# Patient Record
Sex: Female | Born: 1955 | Race: White | Hispanic: No | Marital: Married | Smoking: Current every day smoker
Health system: Southern US, Community
[De-identification: ages and names within clinical notes are randomized; demographics above are authoritative.]

## PROBLEM LIST (undated history)

## (undated) DIAGNOSIS — D696 Thrombocytopenia, unspecified: Secondary | ICD-10-CM

## (undated) DIAGNOSIS — K7290 Hepatic failure, unspecified without coma: Secondary | ICD-10-CM

## (undated) DIAGNOSIS — G8929 Other chronic pain: Secondary | ICD-10-CM

## (undated) DIAGNOSIS — M549 Dorsalgia, unspecified: Secondary | ICD-10-CM

## (undated) DIAGNOSIS — I10 Essential (primary) hypertension: Secondary | ICD-10-CM

## (undated) DIAGNOSIS — M797 Fibromyalgia: Secondary | ICD-10-CM

## (undated) DIAGNOSIS — M542 Cervicalgia: Secondary | ICD-10-CM

## (undated) DIAGNOSIS — I8290 Acute embolism and thrombosis of unspecified vein: Secondary | ICD-10-CM

## (undated) DIAGNOSIS — K7682 Hepatic encephalopathy: Secondary | ICD-10-CM

## (undated) DIAGNOSIS — F419 Anxiety disorder, unspecified: Secondary | ICD-10-CM

## (undated) DIAGNOSIS — K7010 Alcoholic hepatitis without ascites: Secondary | ICD-10-CM

## (undated) HISTORY — PX: ROTATOR CUFF REPAIR: SHX139

---

## 1997-08-30 ENCOUNTER — Emergency Department (HOSPITAL_COMMUNITY): Admission: EM | Admit: 1997-08-30 | Discharge: 1997-08-30 | Payer: Self-pay | Admitting: Emergency Medicine

## 1998-10-27 ENCOUNTER — Other Ambulatory Visit: Admission: RE | Admit: 1998-10-27 | Discharge: 1998-10-27 | Payer: Self-pay | Admitting: Obstetrics and Gynecology

## 1998-11-10 ENCOUNTER — Encounter: Admission: RE | Admit: 1998-11-10 | Discharge: 1998-12-03 | Payer: Self-pay | Admitting: Orthopedic Surgery

## 1999-01-29 ENCOUNTER — Emergency Department (HOSPITAL_COMMUNITY): Admission: EM | Admit: 1999-01-29 | Discharge: 1999-01-29 | Payer: Self-pay | Admitting: Emergency Medicine

## 1999-12-13 ENCOUNTER — Encounter: Admission: RE | Admit: 1999-12-13 | Discharge: 1999-12-13 | Payer: Self-pay

## 2000-07-05 ENCOUNTER — Inpatient Hospital Stay (HOSPITAL_COMMUNITY): Admission: EM | Admit: 2000-07-05 | Discharge: 2000-07-06 | Payer: Self-pay | Admitting: *Deleted

## 2000-12-02 ENCOUNTER — Emergency Department (HOSPITAL_COMMUNITY): Admission: EM | Admit: 2000-12-02 | Discharge: 2000-12-02 | Payer: Self-pay

## 2000-12-28 ENCOUNTER — Encounter: Admission: RE | Admit: 2000-12-28 | Discharge: 2000-12-28 | Payer: Self-pay

## 2005-01-07 ENCOUNTER — Encounter: Admission: RE | Admit: 2005-01-07 | Discharge: 2005-01-07 | Payer: Self-pay | Admitting: Family Medicine

## 2006-09-29 ENCOUNTER — Ambulatory Visit (HOSPITAL_COMMUNITY): Admission: RE | Admit: 2006-09-29 | Discharge: 2006-09-29 | Payer: Self-pay | Admitting: Gastroenterology

## 2006-09-29 ENCOUNTER — Encounter (INDEPENDENT_AMBULATORY_CARE_PROVIDER_SITE_OTHER): Payer: Self-pay | Admitting: Specialist

## 2007-02-16 ENCOUNTER — Encounter: Admission: RE | Admit: 2007-02-16 | Discharge: 2007-02-16 | Payer: Self-pay | Admitting: Family Medicine

## 2008-04-02 ENCOUNTER — Encounter (HOSPITAL_COMMUNITY): Admission: RE | Admit: 2008-04-02 | Discharge: 2008-05-22 | Payer: Self-pay | Admitting: Family Medicine

## 2008-06-11 ENCOUNTER — Encounter (HOSPITAL_COMMUNITY): Admission: RE | Admit: 2008-06-11 | Discharge: 2008-09-09 | Payer: Self-pay | Admitting: Family Medicine

## 2009-09-07 ENCOUNTER — Inpatient Hospital Stay (HOSPITAL_COMMUNITY)
Admission: EM | Admit: 2009-09-07 | Discharge: 2009-09-21 | Payer: Self-pay | Source: Home / Self Care | Admitting: Emergency Medicine

## 2009-09-08 ENCOUNTER — Encounter (INDEPENDENT_AMBULATORY_CARE_PROVIDER_SITE_OTHER): Payer: Self-pay | Admitting: Internal Medicine

## 2009-09-13 ENCOUNTER — Ambulatory Visit: Payer: Self-pay | Admitting: Internal Medicine

## 2009-10-02 ENCOUNTER — Inpatient Hospital Stay (HOSPITAL_COMMUNITY): Admission: EM | Admit: 2009-10-02 | Discharge: 2009-10-05 | Payer: Self-pay | Admitting: Emergency Medicine

## 2009-10-24 ENCOUNTER — Observation Stay (HOSPITAL_COMMUNITY): Admission: EM | Admit: 2009-10-24 | Discharge: 2009-10-25 | Payer: Self-pay | Admitting: Emergency Medicine

## 2009-11-09 ENCOUNTER — Encounter: Admission: RE | Admit: 2009-11-09 | Discharge: 2009-11-09 | Payer: Self-pay | Admitting: Internal Medicine

## 2010-08-09 LAB — GLUCOSE, CAPILLARY
Glucose-Capillary: 187 mg/dL — ABNORMAL HIGH (ref 70–99)
Glucose-Capillary: 373 mg/dL — ABNORMAL HIGH (ref 70–99)
Glucose-Capillary: 394 mg/dL — ABNORMAL HIGH (ref 70–99)
Glucose-Capillary: 420 mg/dL — ABNORMAL HIGH (ref 70–99)
Glucose-Capillary: 442 mg/dL — ABNORMAL HIGH (ref 70–99)
Glucose-Capillary: 475 mg/dL — ABNORMAL HIGH (ref 70–99)
Glucose-Capillary: 509 mg/dL — ABNORMAL HIGH (ref 70–99)
Glucose-Capillary: 187 mg/dL — ABNORMAL HIGH (ref 70–99)
Glucose-Capillary: 218 mg/dL — ABNORMAL HIGH (ref 70–99)
Glucose-Capillary: 265 mg/dL — ABNORMAL HIGH (ref 70–99)
Glucose-Capillary: 312 mg/dL — ABNORMAL HIGH (ref 70–99)
Glucose-Capillary: 317 mg/dL — ABNORMAL HIGH (ref 70–99)
Glucose-Capillary: 321 mg/dL — ABNORMAL HIGH (ref 70–99)

## 2010-08-09 LAB — HEMOGLOBIN A1C
Hgb A1c MFr Bld: 6.9 % — ABNORMAL HIGH (ref <5.7)
Mean Plasma Glucose: 151 mg/dL — ABNORMAL HIGH (ref <117)

## 2010-08-09 LAB — URINALYSIS, ROUTINE W REFLEX MICROSCOPIC
Bilirubin Urine: NEGATIVE
Glucose, UA: >1000 mg/dL — AB
Hgb urine dipstick: NEGATIVE
Ketones, ur: NEGATIVE mg/dL
Leukocytes, UA: NEGATIVE
Nitrite: NEGATIVE
Protein, ur: NEGATIVE mg/dL
Specific Gravity, Urine: 1.031 — ABNORMAL HIGH (ref 1.005–1.030)
Urobilinogen, UA: 0.2 mg/dL (ref 0.0–1.0)
pH: 5.5 (ref 5.0–8.0)

## 2010-08-09 LAB — COMPREHENSIVE METABOLIC PANEL
ALT: 37 U/L — ABNORMAL HIGH (ref 0–35)
AST: 52 U/L — ABNORMAL HIGH (ref 0–37)
Albumin: 3.3 g/dL — ABNORMAL LOW (ref 3.5–5.2)
Alkaline Phosphatase: 134 U/L — ABNORMAL HIGH (ref 39–117)
BUN: 6 mg/dL (ref 6–23)
CO2: 24 mEq/L (ref 19–32)
Calcium: 9.5 mg/dL (ref 8.4–10.5)
Chloride: 91 mEq/L — ABNORMAL LOW (ref 96–112)
Creatinine, Ser: 0.8 mg/dL (ref 0.4–1.2)
GFR calc Af Amer: >60 mL/min (ref >60)
GFR calc non Af Amer: >60 mL/min (ref >60)
Glucose, Bld: 483 mg/dL — ABNORMAL HIGH (ref 70–99)
Potassium: 3.5 mEq/L (ref 3.5–5.1)
Sodium: 126 mEq/L — ABNORMAL LOW (ref 135–145)
Total Bilirubin: 3.2 mg/dL — ABNORMAL HIGH (ref 0.3–1.2)
Total Protein: 7.5 g/dL (ref 6.0–8.3)

## 2010-08-09 LAB — BASIC METABOLIC PANEL
BUN: 3 mg/dL — ABNORMAL LOW (ref 6–23)
BUN: 6 mg/dL (ref 6–23)
CO2: 24 mEq/L (ref 19–32)
CO2: 26 mEq/L (ref 19–32)
Calcium: 8.3 mg/dL — ABNORMAL LOW (ref 8.4–10.5)
Calcium: 8.5 mg/dL (ref 8.4–10.5)
Chloride: 104 mEq/L (ref 96–112)
Chloride: 105 mEq/L (ref 96–112)
Creatinine, Ser: 0.49 mg/dL (ref 0.4–1.2)
Creatinine, Ser: 0.51 mg/dL (ref 0.4–1.2)
GFR calc Af Amer: >60 mL/min (ref >60)
GFR calc Af Amer: >60 mL/min (ref >60)
GFR calc non Af Amer: >60 mL/min (ref >60)
GFR calc non Af Amer: >60 mL/min (ref >60)
Glucose, Bld: 193 mg/dL — ABNORMAL HIGH (ref 70–99)
Glucose, Bld: 220 mg/dL — ABNORMAL HIGH (ref 70–99)
Potassium: 3.4 mEq/L — ABNORMAL LOW (ref 3.5–5.1)
Potassium: 3.6 mEq/L (ref 3.5–5.1)
Sodium: 136 mEq/L (ref 135–145)
Sodium: 137 mEq/L (ref 135–145)

## 2010-08-09 LAB — CBC
HCT: 33.7 % — ABNORMAL LOW (ref 36.0–46.0)
HCT: 26.4 % — ABNORMAL LOW (ref 36.0–46.0)
Hemoglobin: 11.5 g/dL — ABNORMAL LOW (ref 12.0–15.0)
Hemoglobin: 9 g/dL — ABNORMAL LOW (ref 12.0–15.0)
MCHC: 34.2 g/dL (ref 30.0–36.0)
MCHC: 34.2 g/dL (ref 30.0–36.0)
MCV: 97.9 fL (ref 78.0–100.0)
MCV: 111.6 fL — ABNORMAL HIGH (ref 78.0–100.0)
Platelets: 151 10*3/uL (ref 150–400)
Platelets: 108 10*3/uL — ABNORMAL LOW (ref 150–400)
RBC: 3.44 MIL/uL — ABNORMAL LOW (ref 3.87–5.11)
RBC: 2.36 MIL/uL — ABNORMAL LOW (ref 3.87–5.11)
RDW: 19.8 % — ABNORMAL HIGH (ref 11.5–15.5)
RDW: 17.2 % — ABNORMAL HIGH (ref 11.5–15.5)
WBC: 6.2 10*3/uL (ref 4.0–10.5)
WBC: 6.7 10*3/uL (ref 4.0–10.5)

## 2010-08-09 LAB — HEPATIC FUNCTION PANEL
ALT: 55 U/L — ABNORMAL HIGH (ref 0–35)
AST: 64 U/L — ABNORMAL HIGH (ref 0–37)
Albumin: 2.3 g/dL — ABNORMAL LOW (ref 3.5–5.2)
Alkaline Phosphatase: 151 U/L — ABNORMAL HIGH (ref 39–117)
Bilirubin, Direct: 2.5 mg/dL — ABNORMAL HIGH (ref 0.0–0.3)
Indirect Bilirubin: 2.9 mg/dL — ABNORMAL HIGH (ref 0.3–0.9)
Total Bilirubin: 5.4 mg/dL — ABNORMAL HIGH (ref 0.3–1.2)
Total Protein: 5.9 g/dL — ABNORMAL LOW (ref 6.0–8.3)

## 2010-08-09 LAB — POCT CARDIAC MARKERS
CKMB, poc: <1 ng/mL — ABNORMAL LOW (ref 1.0–8.0)
Myoglobin, poc: 75.7 ng/mL (ref 12–200)
Troponin i, poc: <0.05 ng/mL (ref 0.00–0.09)

## 2010-08-09 LAB — URINE MICROSCOPIC-ADD ON

## 2010-08-09 LAB — DIFFERENTIAL
Basophils Absolute: 0 10*3/uL (ref 0.0–0.1)
Basophils Relative: 1 % (ref 0–1)
Eosinophils Absolute: 0.1 10*3/uL (ref 0.0–0.7)
Eosinophils Relative: 2 % (ref 0–5)
Lymphocytes Relative: 26 % (ref 12–46)
Lymphs Abs: 1.6 10*3/uL (ref 0.7–4.0)
Monocytes Absolute: 0.8 10*3/uL (ref 0.1–1.0)
Monocytes Relative: 12 % (ref 3–12)
Neutro Abs: 3.6 10*3/uL (ref 1.7–7.7)
Neutrophils Relative %: 59 % (ref 43–77)

## 2010-08-10 LAB — COMPREHENSIVE METABOLIC PANEL
ALT: 36 U/L — ABNORMAL HIGH (ref 0–35)
ALT: 39 U/L — ABNORMAL HIGH (ref 0–35)
ALT: 54 U/L — ABNORMAL HIGH (ref 0–35)
ALT: 30 U/L (ref 0–35)
ALT: 31 U/L (ref 0–35)
ALT: 32 U/L (ref 0–35)
ALT: 32 U/L (ref 0–35)
ALT: 33 U/L (ref 0–35)
ALT: 33 U/L (ref 0–35)
ALT: 33 U/L (ref 0–35)
ALT: 36 U/L — ABNORMAL HIGH (ref 0–35)
ALT: 36 U/L — ABNORMAL HIGH (ref 0–35)
ALT: 37 U/L — ABNORMAL HIGH (ref 0–35)
ALT: 60 U/L — ABNORMAL HIGH (ref 0–35)
AST: 58 U/L — ABNORMAL HIGH (ref 0–37)
AST: 85 U/L — ABNORMAL HIGH (ref 0–37)
AST: 95 U/L — ABNORMAL HIGH (ref 0–37)
AST: 114 U/L — ABNORMAL HIGH (ref 0–37)
AST: 115 U/L — ABNORMAL HIGH (ref 0–37)
AST: 119 U/L — ABNORMAL HIGH (ref 0–37)
AST: 198 U/L — ABNORMAL HIGH (ref 0–37)
AST: 82 U/L — ABNORMAL HIGH (ref 0–37)
AST: 86 U/L — ABNORMAL HIGH (ref 0–37)
AST: 88 U/L — ABNORMAL HIGH (ref 0–37)
AST: 89 U/L — ABNORMAL HIGH (ref 0–37)
AST: 91 U/L — ABNORMAL HIGH (ref 0–37)
AST: 93 U/L — ABNORMAL HIGH (ref 0–37)
AST: 94 U/L — ABNORMAL HIGH (ref 0–37)
Albumin: 1.9 g/dL — ABNORMAL LOW (ref 3.5–5.2)
Albumin: 2.2 g/dL — ABNORMAL LOW (ref 3.5–5.2)
Albumin: 2.2 g/dL — ABNORMAL LOW (ref 3.5–5.2)
Albumin: 1.4 g/dL — ABNORMAL LOW (ref 3.5–5.2)
Albumin: 1.5 g/dL — ABNORMAL LOW (ref 3.5–5.2)
Albumin: 1.7 g/dL — ABNORMAL LOW (ref 3.5–5.2)
Albumin: 1.8 g/dL — ABNORMAL LOW (ref 3.5–5.2)
Albumin: 1.8 g/dL — ABNORMAL LOW (ref 3.5–5.2)
Albumin: 1.8 g/dL — ABNORMAL LOW (ref 3.5–5.2)
Albumin: 1.9 g/dL — ABNORMAL LOW (ref 3.5–5.2)
Albumin: 1.9 g/dL — ABNORMAL LOW (ref 3.5–5.2)
Albumin: 2.2 g/dL — ABNORMAL LOW (ref 3.5–5.2)
Albumin: 2.2 g/dL — ABNORMAL LOW (ref 3.5–5.2)
Albumin: 2.3 g/dL — ABNORMAL LOW (ref 3.5–5.2)
Alkaline Phosphatase: 131 U/L — ABNORMAL HIGH (ref 39–117)
Alkaline Phosphatase: 149 U/L — ABNORMAL HIGH (ref 39–117)
Alkaline Phosphatase: 154 U/L — ABNORMAL HIGH (ref 39–117)
Alkaline Phosphatase: 130 U/L — ABNORMAL HIGH (ref 39–117)
Alkaline Phosphatase: 130 U/L — ABNORMAL HIGH (ref 39–117)
Alkaline Phosphatase: 133 U/L — ABNORMAL HIGH (ref 39–117)
Alkaline Phosphatase: 137 U/L — ABNORMAL HIGH (ref 39–117)
Alkaline Phosphatase: 144 U/L — ABNORMAL HIGH (ref 39–117)
Alkaline Phosphatase: 152 U/L — ABNORMAL HIGH (ref 39–117)
Alkaline Phosphatase: 159 U/L — ABNORMAL HIGH (ref 39–117)
Alkaline Phosphatase: 181 U/L — ABNORMAL HIGH (ref 39–117)
Alkaline Phosphatase: 195 U/L — ABNORMAL HIGH (ref 39–117)
Alkaline Phosphatase: 211 U/L — ABNORMAL HIGH (ref 39–117)
Alkaline Phosphatase: 349 U/L — ABNORMAL HIGH (ref 39–117)
BUN: 5 mg/dL — ABNORMAL LOW (ref 6–23)
BUN: 7 mg/dL (ref 6–23)
BUN: 9 mg/dL (ref 6–23)
BUN: 2 mg/dL — ABNORMAL LOW (ref 6–23)
BUN: 3 mg/dL — ABNORMAL LOW (ref 6–23)
BUN: 3 mg/dL — ABNORMAL LOW (ref 6–23)
BUN: 3 mg/dL — ABNORMAL LOW (ref 6–23)
BUN: 3 mg/dL — ABNORMAL LOW (ref 6–23)
BUN: 4 mg/dL — ABNORMAL LOW (ref 6–23)
BUN: 5 mg/dL — ABNORMAL LOW (ref 6–23)
BUN: 6 mg/dL (ref 6–23)
BUN: <1 mg/dL — ABNORMAL LOW (ref 6–23)
BUN: <1 mg/dL — ABNORMAL LOW (ref 6–23)
BUN: <1 mg/dL — ABNORMAL LOW (ref 6–23)
CO2: 21 mEq/L (ref 19–32)
CO2: 21 mEq/L (ref 19–32)
CO2: 29 mEq/L (ref 19–32)
CO2: 21 mEq/L (ref 19–32)
CO2: 21 mEq/L (ref 19–32)
CO2: 21 mEq/L (ref 19–32)
CO2: 22 mEq/L (ref 19–32)
CO2: 22 mEq/L (ref 19–32)
CO2: 22 mEq/L (ref 19–32)
CO2: 22 mEq/L (ref 19–32)
CO2: 23 mEq/L (ref 19–32)
CO2: 23 mEq/L (ref 19–32)
CO2: 24 mEq/L (ref 19–32)
CO2: 25 mEq/L (ref 19–32)
Calcium: 7.9 mg/dL — ABNORMAL LOW (ref 8.4–10.5)
Calcium: 8 mg/dL — ABNORMAL LOW (ref 8.4–10.5)
Calcium: 8.1 mg/dL — ABNORMAL LOW (ref 8.4–10.5)
Calcium: 7.4 mg/dL — ABNORMAL LOW (ref 8.4–10.5)
Calcium: 7.5 mg/dL — ABNORMAL LOW (ref 8.4–10.5)
Calcium: 7.7 mg/dL — ABNORMAL LOW (ref 8.4–10.5)
Calcium: 7.7 mg/dL — ABNORMAL LOW (ref 8.4–10.5)
Calcium: 7.8 mg/dL — ABNORMAL LOW (ref 8.4–10.5)
Calcium: 7.8 mg/dL — ABNORMAL LOW (ref 8.4–10.5)
Calcium: 7.9 mg/dL — ABNORMAL LOW (ref 8.4–10.5)
Calcium: 7.9 mg/dL — ABNORMAL LOW (ref 8.4–10.5)
Calcium: 7.9 mg/dL — ABNORMAL LOW (ref 8.4–10.5)
Calcium: 8 mg/dL — ABNORMAL LOW (ref 8.4–10.5)
Calcium: 8.1 mg/dL — ABNORMAL LOW (ref 8.4–10.5)
Chloride: 100 mEq/L (ref 96–112)
Chloride: 108 mEq/L (ref 96–112)
Chloride: 111 mEq/L (ref 96–112)
Chloride: 100 mEq/L (ref 96–112)
Chloride: 104 mEq/L (ref 96–112)
Chloride: 104 mEq/L (ref 96–112)
Chloride: 104 mEq/L (ref 96–112)
Chloride: 107 mEq/L (ref 96–112)
Chloride: 107 mEq/L (ref 96–112)
Chloride: 108 mEq/L (ref 96–112)
Chloride: 109 mEq/L (ref 96–112)
Chloride: 84 mEq/L — ABNORMAL LOW (ref 96–112)
Chloride: 98 mEq/L (ref 96–112)
Chloride: 99 mEq/L (ref 96–112)
Creatinine, Ser: 0.54 mg/dL (ref 0.4–1.2)
Creatinine, Ser: 0.55 mg/dL (ref 0.4–1.2)
Creatinine, Ser: 0.56 mg/dL (ref 0.4–1.2)
Creatinine, Ser: 0.45 mg/dL (ref 0.4–1.2)
Creatinine, Ser: 0.45 mg/dL (ref 0.4–1.2)
Creatinine, Ser: 0.46 mg/dL (ref 0.4–1.2)
Creatinine, Ser: 0.48 mg/dL (ref 0.4–1.2)
Creatinine, Ser: 0.52 mg/dL (ref 0.4–1.2)
Creatinine, Ser: 0.53 mg/dL (ref 0.4–1.2)
Creatinine, Ser: 0.54 mg/dL (ref 0.4–1.2)
Creatinine, Ser: 0.54 mg/dL (ref 0.4–1.2)
Creatinine, Ser: 0.54 mg/dL (ref 0.4–1.2)
Creatinine, Ser: 0.55 mg/dL (ref 0.4–1.2)
Creatinine, Ser: 0.57 mg/dL (ref 0.4–1.2)
GFR calc Af Amer: >60 mL/min (ref >60)
GFR calc Af Amer: >60 mL/min (ref >60)
GFR calc Af Amer: >60 mL/min (ref >60)
GFR calc Af Amer: >60 mL/min (ref >60)
GFR calc Af Amer: >60 mL/min (ref >60)
GFR calc Af Amer: >60 mL/min (ref >60)
GFR calc Af Amer: >60 mL/min (ref >60)
GFR calc Af Amer: >60 mL/min (ref >60)
GFR calc Af Amer: >60 mL/min (ref >60)
GFR calc Af Amer: >60 mL/min (ref >60)
GFR calc Af Amer: >60 mL/min (ref >60)
GFR calc Af Amer: >60 mL/min (ref >60)
GFR calc Af Amer: >60 mL/min (ref >60)
GFR calc Af Amer: >60 mL/min (ref >60)
GFR calc non Af Amer: >60 mL/min (ref >60)
GFR calc non Af Amer: >60 mL/min (ref >60)
GFR calc non Af Amer: >60 mL/min (ref >60)
GFR calc non Af Amer: >60 mL/min (ref >60)
GFR calc non Af Amer: >60 mL/min (ref >60)
GFR calc non Af Amer: >60 mL/min (ref >60)
GFR calc non Af Amer: >60 mL/min (ref >60)
GFR calc non Af Amer: >60 mL/min (ref >60)
GFR calc non Af Amer: >60 mL/min (ref >60)
GFR calc non Af Amer: >60 mL/min (ref >60)
GFR calc non Af Amer: >60 mL/min (ref >60)
GFR calc non Af Amer: >60 mL/min (ref >60)
GFR calc non Af Amer: >60 mL/min (ref >60)
GFR calc non Af Amer: >60 mL/min (ref >60)
Glucose, Bld: 139 mg/dL — ABNORMAL HIGH (ref 70–99)
Glucose, Bld: 207 mg/dL — ABNORMAL HIGH (ref 70–99)
Glucose, Bld: 214 mg/dL — ABNORMAL HIGH (ref 70–99)
Glucose, Bld: 104 mg/dL — ABNORMAL HIGH (ref 70–99)
Glucose, Bld: 106 mg/dL — ABNORMAL HIGH (ref 70–99)
Glucose, Bld: 107 mg/dL — ABNORMAL HIGH (ref 70–99)
Glucose, Bld: 108 mg/dL — ABNORMAL HIGH (ref 70–99)
Glucose, Bld: 113 mg/dL — ABNORMAL HIGH (ref 70–99)
Glucose, Bld: 113 mg/dL — ABNORMAL HIGH (ref 70–99)
Glucose, Bld: 139 mg/dL — ABNORMAL HIGH (ref 70–99)
Glucose, Bld: 141 mg/dL — ABNORMAL HIGH (ref 70–99)
Glucose, Bld: 142 mg/dL — ABNORMAL HIGH (ref 70–99)
Glucose, Bld: 143 mg/dL — ABNORMAL HIGH (ref 70–99)
Glucose, Bld: 96 mg/dL (ref 70–99)
Potassium: 2.9 mEq/L — ABNORMAL LOW (ref 3.5–5.1)
Potassium: 3.6 mEq/L (ref 3.5–5.1)
Potassium: 4.2 mEq/L (ref 3.5–5.1)
Potassium: 3.3 mEq/L — ABNORMAL LOW (ref 3.5–5.1)
Potassium: 3.4 mEq/L — ABNORMAL LOW (ref 3.5–5.1)
Potassium: 3.4 mEq/L — ABNORMAL LOW (ref 3.5–5.1)
Potassium: 3.6 mEq/L (ref 3.5–5.1)
Potassium: 3.6 mEq/L (ref 3.5–5.1)
Potassium: 3.6 mEq/L (ref 3.5–5.1)
Potassium: 3.6 mEq/L (ref 3.5–5.1)
Potassium: 3.6 mEq/L (ref 3.5–5.1)
Potassium: 3.7 mEq/L (ref 3.5–5.1)
Potassium: 3.7 mEq/L (ref 3.5–5.1)
Potassium: 3.8 mEq/L (ref 3.5–5.1)
Sodium: 132 mEq/L — ABNORMAL LOW (ref 135–145)
Sodium: 132 mEq/L — ABNORMAL LOW (ref 135–145)
Sodium: 135 mEq/L (ref 135–145)
Sodium: 115 mEq/L — CL (ref 135–145)
Sodium: 125 mEq/L — ABNORMAL LOW (ref 135–145)
Sodium: 128 mEq/L — ABNORMAL LOW (ref 135–145)
Sodium: 129 mEq/L — ABNORMAL LOW (ref 135–145)
Sodium: 132 mEq/L — ABNORMAL LOW (ref 135–145)
Sodium: 132 mEq/L — ABNORMAL LOW (ref 135–145)
Sodium: 133 mEq/L — ABNORMAL LOW (ref 135–145)
Sodium: 133 mEq/L — ABNORMAL LOW (ref 135–145)
Sodium: 133 mEq/L — ABNORMAL LOW (ref 135–145)
Sodium: 134 mEq/L — ABNORMAL LOW (ref 135–145)
Sodium: 136 mEq/L (ref 135–145)
Total Bilirubin: 12.4 mg/dL — ABNORMAL HIGH (ref 0.3–1.2)
Total Bilirubin: 13.6 mg/dL — ABNORMAL HIGH (ref 0.3–1.2)
Total Bilirubin: 5.2 mg/dL — ABNORMAL HIGH (ref 0.3–1.2)
Total Bilirubin: 14.5 mg/dL — ABNORMAL HIGH (ref 0.3–1.2)
Total Bilirubin: 15.1 mg/dL — ABNORMAL HIGH (ref 0.3–1.2)
Total Bilirubin: 15.3 mg/dL — ABNORMAL HIGH (ref 0.3–1.2)
Total Bilirubin: 16.4 mg/dL — ABNORMAL HIGH (ref 0.3–1.2)
Total Bilirubin: 16.5 mg/dL — ABNORMAL HIGH (ref 0.3–1.2)
Total Bilirubin: 16.8 mg/dL — ABNORMAL HIGH (ref 0.3–1.2)
Total Bilirubin: 16.8 mg/dL — ABNORMAL HIGH (ref 0.3–1.2)
Total Bilirubin: 17 mg/dL — ABNORMAL HIGH (ref 0.3–1.2)
Total Bilirubin: 17.3 mg/dL — ABNORMAL HIGH (ref 0.3–1.2)
Total Bilirubin: 18.4 mg/dL (ref 0.3–1.2)
Total Bilirubin: 21.3 mg/dL (ref 0.3–1.2)
Total Protein: 5.7 g/dL — ABNORMAL LOW (ref 6.0–8.3)
Total Protein: 5.7 g/dL — ABNORMAL LOW (ref 6.0–8.3)
Total Protein: 6.1 g/dL (ref 6.0–8.3)
Total Protein: 5.4 g/dL — ABNORMAL LOW (ref 6.0–8.3)
Total Protein: 5.5 g/dL — ABNORMAL LOW (ref 6.0–8.3)
Total Protein: 5.5 g/dL — ABNORMAL LOW (ref 6.0–8.3)
Total Protein: 5.5 g/dL — ABNORMAL LOW (ref 6.0–8.3)
Total Protein: 5.5 g/dL — ABNORMAL LOW (ref 6.0–8.3)
Total Protein: 5.6 g/dL — ABNORMAL LOW (ref 6.0–8.3)
Total Protein: 5.6 g/dL — ABNORMAL LOW (ref 6.0–8.3)
Total Protein: 5.6 g/dL — ABNORMAL LOW (ref 6.0–8.3)
Total Protein: 5.8 g/dL — ABNORMAL LOW (ref 6.0–8.3)
Total Protein: 6.3 g/dL (ref 6.0–8.3)
Total Protein: 7.6 g/dL (ref 6.0–8.3)

## 2010-08-10 LAB — PROTIME-INR
INR: 1.5 — ABNORMAL HIGH (ref 0.00–1.49)
INR: 2.17 — ABNORMAL HIGH (ref 0.00–1.49)
INR: 1.7 — ABNORMAL HIGH (ref 0.00–1.49)
INR: 1.77 — ABNORMAL HIGH (ref 0.00–1.49)
INR: 1.79 — ABNORMAL HIGH (ref 0.00–1.49)
INR: 1.83 — ABNORMAL HIGH (ref 0.00–1.49)
INR: 1.83 — ABNORMAL HIGH (ref 0.00–1.49)
INR: 1.92 — ABNORMAL HIGH (ref 0.00–1.49)
INR: 1.98 — ABNORMAL HIGH (ref 0.00–1.49)
INR: 2.3 — ABNORMAL HIGH (ref 0.00–1.49)
Prothrombin Time: 18 seconds — ABNORMAL HIGH (ref 11.6–15.2)
Prothrombin Time: 24 seconds — ABNORMAL HIGH (ref 11.6–15.2)
Prothrombin Time: 19.8 seconds — ABNORMAL HIGH (ref 11.6–15.2)
Prothrombin Time: 20.5 seconds — ABNORMAL HIGH (ref 11.6–15.2)
Prothrombin Time: 20.6 seconds — ABNORMAL HIGH (ref 11.6–15.2)
Prothrombin Time: 21 seconds — ABNORMAL HIGH (ref 11.6–15.2)
Prothrombin Time: 21 seconds — ABNORMAL HIGH (ref 11.6–15.2)
Prothrombin Time: 21.8 seconds — ABNORMAL HIGH (ref 11.6–15.2)
Prothrombin Time: 22.3 seconds — ABNORMAL HIGH (ref 11.6–15.2)
Prothrombin Time: 25.1 seconds — ABNORMAL HIGH (ref 11.6–15.2)

## 2010-08-10 LAB — BASIC METABOLIC PANEL
BUN: 9 mg/dL (ref 6–23)
BUN: 2 mg/dL — ABNORMAL LOW (ref 6–23)
BUN: 2 mg/dL — ABNORMAL LOW (ref 6–23)
BUN: 2 mg/dL — ABNORMAL LOW (ref 6–23)
BUN: 2 mg/dL — ABNORMAL LOW (ref 6–23)
BUN: 2 mg/dL — ABNORMAL LOW (ref 6–23)
BUN: 2 mg/dL — ABNORMAL LOW (ref 6–23)
BUN: 2 mg/dL — ABNORMAL LOW (ref 6–23)
BUN: 2 mg/dL — ABNORMAL LOW (ref 6–23)
BUN: 3 mg/dL — ABNORMAL LOW (ref 6–23)
BUN: 3 mg/dL — ABNORMAL LOW (ref 6–23)
CO2: 25 mEq/L (ref 19–32)
CO2: 22 mEq/L (ref 19–32)
CO2: 22 mEq/L (ref 19–32)
CO2: 23 mEq/L (ref 19–32)
CO2: 23 mEq/L (ref 19–32)
CO2: 23 mEq/L (ref 19–32)
CO2: 23 mEq/L (ref 19–32)
CO2: 23 mEq/L (ref 19–32)
CO2: 23 mEq/L (ref 19–32)
CO2: 25 mEq/L (ref 19–32)
CO2: 25 mEq/L (ref 19–32)
Calcium: 9 mg/dL (ref 8.4–10.5)
Calcium: 7.6 mg/dL — ABNORMAL LOW (ref 8.4–10.5)
Calcium: 7.6 mg/dL — ABNORMAL LOW (ref 8.4–10.5)
Calcium: 7.8 mg/dL — ABNORMAL LOW (ref 8.4–10.5)
Calcium: 7.8 mg/dL — ABNORMAL LOW (ref 8.4–10.5)
Calcium: 7.8 mg/dL — ABNORMAL LOW (ref 8.4–10.5)
Calcium: 7.9 mg/dL — ABNORMAL LOW (ref 8.4–10.5)
Calcium: 7.9 mg/dL — ABNORMAL LOW (ref 8.4–10.5)
Calcium: 8 mg/dL — ABNORMAL LOW (ref 8.4–10.5)
Calcium: 8 mg/dL — ABNORMAL LOW (ref 8.4–10.5)
Calcium: 8.1 mg/dL — ABNORMAL LOW (ref 8.4–10.5)
Chloride: 96 mEq/L (ref 96–112)
Chloride: 85 mEq/L — ABNORMAL LOW (ref 96–112)
Chloride: 92 mEq/L — ABNORMAL LOW (ref 96–112)
Chloride: 94 mEq/L — ABNORMAL LOW (ref 96–112)
Chloride: 95 mEq/L — ABNORMAL LOW (ref 96–112)
Chloride: 95 mEq/L — ABNORMAL LOW (ref 96–112)
Chloride: 95 mEq/L — ABNORMAL LOW (ref 96–112)
Chloride: 95 mEq/L — ABNORMAL LOW (ref 96–112)
Chloride: 96 mEq/L (ref 96–112)
Chloride: 96 mEq/L (ref 96–112)
Chloride: 96 mEq/L (ref 96–112)
Creatinine, Ser: 0.87 mg/dL (ref 0.4–1.2)
Creatinine, Ser: 0.45 mg/dL (ref 0.4–1.2)
Creatinine, Ser: 0.45 mg/dL (ref 0.4–1.2)
Creatinine, Ser: 0.45 mg/dL (ref 0.4–1.2)
Creatinine, Ser: 0.46 mg/dL (ref 0.4–1.2)
Creatinine, Ser: 0.47 mg/dL (ref 0.4–1.2)
Creatinine, Ser: 0.54 mg/dL (ref 0.4–1.2)
Creatinine, Ser: 0.56 mg/dL (ref 0.4–1.2)
Creatinine, Ser: 0.59 mg/dL (ref 0.4–1.2)
Creatinine, Ser: 0.63 mg/dL (ref 0.4–1.2)
Creatinine, Ser: 0.66 mg/dL (ref 0.4–1.2)
GFR calc Af Amer: >60 mL/min (ref >60)
GFR calc Af Amer: >60 mL/min (ref >60)
GFR calc Af Amer: >60 mL/min (ref >60)
GFR calc Af Amer: >60 mL/min (ref >60)
GFR calc Af Amer: >60 mL/min (ref >60)
GFR calc Af Amer: >60 mL/min (ref >60)
GFR calc Af Amer: >60 mL/min (ref >60)
GFR calc Af Amer: >60 mL/min (ref >60)
GFR calc Af Amer: >60 mL/min (ref >60)
GFR calc Af Amer: >60 mL/min (ref >60)
GFR calc Af Amer: >60 mL/min (ref >60)
GFR calc non Af Amer: >60 mL/min (ref >60)
GFR calc non Af Amer: >60 mL/min (ref >60)
GFR calc non Af Amer: >60 mL/min (ref >60)
GFR calc non Af Amer: >60 mL/min (ref >60)
GFR calc non Af Amer: >60 mL/min (ref >60)
GFR calc non Af Amer: >60 mL/min (ref >60)
GFR calc non Af Amer: >60 mL/min (ref >60)
GFR calc non Af Amer: >60 mL/min (ref >60)
GFR calc non Af Amer: >60 mL/min (ref >60)
GFR calc non Af Amer: >60 mL/min (ref >60)
GFR calc non Af Amer: >60 mL/min (ref >60)
Glucose, Bld: 762 mg/dL (ref 70–99)
Glucose, Bld: 110 mg/dL — ABNORMAL HIGH (ref 70–99)
Glucose, Bld: 110 mg/dL — ABNORMAL HIGH (ref 70–99)
Glucose, Bld: 120 mg/dL — ABNORMAL HIGH (ref 70–99)
Glucose, Bld: 142 mg/dL — ABNORMAL HIGH (ref 70–99)
Glucose, Bld: 147 mg/dL — ABNORMAL HIGH (ref 70–99)
Glucose, Bld: 153 mg/dL — ABNORMAL HIGH (ref 70–99)
Glucose, Bld: 156 mg/dL — ABNORMAL HIGH (ref 70–99)
Glucose, Bld: 172 mg/dL — ABNORMAL HIGH (ref 70–99)
Glucose, Bld: 96 mg/dL (ref 70–99)
Glucose, Bld: 99 mg/dL (ref 70–99)
Potassium: 4.1 mEq/L (ref 3.5–5.1)
Potassium: 3.4 mEq/L — ABNORMAL LOW (ref 3.5–5.1)
Potassium: 3.6 mEq/L (ref 3.5–5.1)
Potassium: 3.6 mEq/L (ref 3.5–5.1)
Potassium: 3.8 mEq/L (ref 3.5–5.1)
Potassium: 3.8 mEq/L (ref 3.5–5.1)
Potassium: 3.9 mEq/L (ref 3.5–5.1)
Potassium: 3.9 mEq/L (ref 3.5–5.1)
Potassium: 4 mEq/L (ref 3.5–5.1)
Potassium: 4.1 mEq/L (ref 3.5–5.1)
Potassium: 4.4 mEq/L (ref 3.5–5.1)
Sodium: 131 mEq/L — ABNORMAL LOW (ref 135–145)
Sodium: 113 mEq/L — CL (ref 135–145)
Sodium: 120 mEq/L — ABNORMAL LOW (ref 135–145)
Sodium: 122 mEq/L — ABNORMAL LOW (ref 135–145)
Sodium: 122 mEq/L — ABNORMAL LOW (ref 135–145)
Sodium: 123 mEq/L — ABNORMAL LOW (ref 135–145)
Sodium: 123 mEq/L — ABNORMAL LOW (ref 135–145)
Sodium: 123 mEq/L — ABNORMAL LOW (ref 135–145)
Sodium: 124 mEq/L — ABNORMAL LOW (ref 135–145)
Sodium: 124 mEq/L — ABNORMAL LOW (ref 135–145)
Sodium: 126 mEq/L — ABNORMAL LOW (ref 135–145)

## 2010-08-10 LAB — AMMONIA
Ammonia: 20 umol/L (ref 11–35)
Ammonia: 45 umol/L — ABNORMAL HIGH (ref 11–35)
Ammonia: 50 umol/L — ABNORMAL HIGH (ref 11–35)
Ammonia: 46 umol/L — ABNORMAL HIGH (ref 11–35)
Ammonia: 47 umol/L — ABNORMAL HIGH (ref 11–35)
Ammonia: 50 umol/L — ABNORMAL HIGH (ref 11–35)
Ammonia: 96 umol/L — ABNORMAL HIGH (ref 11–35)

## 2010-08-10 LAB — CBC
HCT: 28.6 % — ABNORMAL LOW (ref 36.0–46.0)
HCT: 23.1 % — ABNORMAL LOW (ref 36.0–46.0)
HCT: 23.3 % — ABNORMAL LOW (ref 36.0–46.0)
HCT: 23.6 % — ABNORMAL LOW (ref 36.0–46.0)
HCT: 23.6 % — ABNORMAL LOW (ref 36.0–46.0)
HCT: 23.7 % — ABNORMAL LOW (ref 36.0–46.0)
HCT: 24.1 % — ABNORMAL LOW (ref 36.0–46.0)
HCT: 24.5 % — ABNORMAL LOW (ref 36.0–46.0)
HCT: 25.4 % — ABNORMAL LOW (ref 36.0–46.0)
HCT: 25.8 % — ABNORMAL LOW (ref 36.0–46.0)
HCT: 28.1 % — ABNORMAL LOW (ref 36.0–46.0)
HCT: 28.2 % — ABNORMAL LOW (ref 36.0–46.0)
HCT: 32.6 % — ABNORMAL LOW (ref 36.0–46.0)
Hemoglobin: 9.7 g/dL — ABNORMAL LOW (ref 12.0–15.0)
Hemoglobin: 10 g/dL — ABNORMAL LOW (ref 12.0–15.0)
Hemoglobin: 11.7 g/dL — ABNORMAL LOW (ref 12.0–15.0)
Hemoglobin: 8.1 g/dL — ABNORMAL LOW (ref 12.0–15.0)
Hemoglobin: 8.2 g/dL — ABNORMAL LOW (ref 12.0–15.0)
Hemoglobin: 8.3 g/dL — ABNORMAL LOW (ref 12.0–15.0)
Hemoglobin: 8.4 g/dL — ABNORMAL LOW (ref 12.0–15.0)
Hemoglobin: 8.4 g/dL — ABNORMAL LOW (ref 12.0–15.0)
Hemoglobin: 8.5 g/dL — ABNORMAL LOW (ref 12.0–15.0)
Hemoglobin: 8.7 g/dL — ABNORMAL LOW (ref 12.0–15.0)
Hemoglobin: 9 g/dL — ABNORMAL LOW (ref 12.0–15.0)
Hemoglobin: 9.2 g/dL — ABNORMAL LOW (ref 12.0–15.0)
Hemoglobin: 9.8 g/dL — ABNORMAL LOW (ref 12.0–15.0)
MCHC: 33.9 g/dL (ref 30.0–36.0)
MCHC: 35 g/dL (ref 30.0–36.0)
MCHC: 35 g/dL (ref 30.0–36.0)
MCHC: 35.2 g/dL (ref 30.0–36.0)
MCHC: 35.2 g/dL (ref 30.0–36.0)
MCHC: 35.3 g/dL (ref 30.0–36.0)
MCHC: 35.4 g/dL (ref 30.0–36.0)
MCHC: 35.4 g/dL (ref 30.0–36.0)
MCHC: 35.5 g/dL (ref 30.0–36.0)
MCHC: 35.6 g/dL (ref 30.0–36.0)
MCHC: 35.7 g/dL (ref 30.0–36.0)
MCHC: 35.8 g/dL (ref 30.0–36.0)
MCHC: 35.8 g/dL (ref 30.0–36.0)
MCV: 112.9 fL — ABNORMAL HIGH (ref 78.0–100.0)
MCV: 115.9 fL — ABNORMAL HIGH (ref 78.0–100.0)
MCV: 116.1 fL — ABNORMAL HIGH (ref 78.0–100.0)
MCV: 117.7 fL — ABNORMAL HIGH (ref 78.0–100.0)
MCV: 117.7 fL — ABNORMAL HIGH (ref 78.0–100.0)
MCV: 117.9 fL — ABNORMAL HIGH (ref 78.0–100.0)
MCV: 118.5 fL — ABNORMAL HIGH (ref 78.0–100.0)
MCV: 118.9 fL — ABNORMAL HIGH (ref 78.0–100.0)
MCV: 119 fL — ABNORMAL HIGH (ref 78.0–100.0)
MCV: 119.3 fL — ABNORMAL HIGH (ref 78.0–100.0)
MCV: 120.3 fL — ABNORMAL HIGH (ref 78.0–100.0)
MCV: 120.3 fL — ABNORMAL HIGH (ref 78.0–100.0)
MCV: 120.6 fL — ABNORMAL HIGH (ref 78.0–100.0)
Platelets: 146 10*3/uL — ABNORMAL LOW (ref 150–400)
Platelets: 102 10*3/uL — ABNORMAL LOW (ref 150–400)
Platelets: 103 10*3/uL — ABNORMAL LOW (ref 150–400)
Platelets: 107 10*3/uL — ABNORMAL LOW (ref 150–400)
Platelets: 67 10*3/uL — ABNORMAL LOW (ref 150–400)
Platelets: 67 10*3/uL — ABNORMAL LOW (ref 150–400)
Platelets: 72 10*3/uL — ABNORMAL LOW (ref 150–400)
Platelets: 72 10*3/uL — ABNORMAL LOW (ref 150–400)
Platelets: 75 10*3/uL — ABNORMAL LOW (ref 150–400)
Platelets: 80 10*3/uL — ABNORMAL LOW (ref 150–400)
Platelets: 82 10*3/uL — ABNORMAL LOW (ref 150–400)
Platelets: 82 10*3/uL — ABNORMAL LOW (ref 150–400)
Platelets: DECREASED 10*3/uL (ref 150–400)
RBC: 2.54 MIL/uL — ABNORMAL LOW (ref 3.87–5.11)
RBC: 1.94 MIL/uL — ABNORMAL LOW (ref 3.87–5.11)
RBC: 1.94 MIL/uL — ABNORMAL LOW (ref 3.87–5.11)
RBC: 1.96 MIL/uL — ABNORMAL LOW (ref 3.87–5.11)
RBC: 1.98 MIL/uL — ABNORMAL LOW (ref 3.87–5.11)
RBC: 2 MIL/uL — ABNORMAL LOW (ref 3.87–5.11)
RBC: 2 MIL/uL — ABNORMAL LOW (ref 3.87–5.11)
RBC: 2.08 MIL/uL — ABNORMAL LOW (ref 3.87–5.11)
RBC: 2.16 MIL/uL — ABNORMAL LOW (ref 3.87–5.11)
RBC: 2.18 MIL/uL — ABNORMAL LOW (ref 3.87–5.11)
RBC: 2.4 MIL/uL — ABNORMAL LOW (ref 3.87–5.11)
RBC: 2.42 MIL/uL — ABNORMAL LOW (ref 3.87–5.11)
RBC: 2.81 MIL/uL — ABNORMAL LOW (ref 3.87–5.11)
RDW: 17.2 % — ABNORMAL HIGH (ref 11.5–15.5)
RDW: 17 % — ABNORMAL HIGH (ref 11.5–15.5)
RDW: 17.1 % — ABNORMAL HIGH (ref 11.5–15.5)
RDW: 17.1 % — ABNORMAL HIGH (ref 11.5–15.5)
RDW: 17.7 % — ABNORMAL HIGH (ref 11.5–15.5)
RDW: 18 % — ABNORMAL HIGH (ref 11.5–15.5)
RDW: 18 % — ABNORMAL HIGH (ref 11.5–15.5)
RDW: 18.2 % — ABNORMAL HIGH (ref 11.5–15.5)
RDW: 18.4 % — ABNORMAL HIGH (ref 11.5–15.5)
RDW: 18.5 % — ABNORMAL HIGH (ref 11.5–15.5)
RDW: 18.6 % — ABNORMAL HIGH (ref 11.5–15.5)
RDW: 18.7 % — ABNORMAL HIGH (ref 11.5–15.5)
RDW: 19.3 % — ABNORMAL HIGH (ref 11.5–15.5)
WBC: 8.5 10*3/uL (ref 4.0–10.5)
WBC: 12.4 10*3/uL — ABNORMAL HIGH (ref 4.0–10.5)
WBC: 5.6 10*3/uL (ref 4.0–10.5)
WBC: 5.9 10*3/uL (ref 4.0–10.5)
WBC: 6 10*3/uL (ref 4.0–10.5)
WBC: 6 10*3/uL (ref 4.0–10.5)
WBC: 6.5 10*3/uL (ref 4.0–10.5)
WBC: 6.6 10*3/uL (ref 4.0–10.5)
WBC: 6.7 10*3/uL (ref 4.0–10.5)
WBC: 6.7 10*3/uL (ref 4.0–10.5)
WBC: 6.8 10*3/uL (ref 4.0–10.5)
WBC: 6.8 10*3/uL (ref 4.0–10.5)
WBC: 6.9 10*3/uL (ref 4.0–10.5)

## 2010-08-10 LAB — DIFFERENTIAL
Basophils Absolute: 0 10*3/uL (ref 0.0–0.1)
Basophils Absolute: 0 10*3/uL (ref 0.0–0.1)
Basophils Absolute: 0 10*3/uL (ref 0.0–0.1)
Basophils Absolute: 0.1 10*3/uL (ref 0.0–0.1)
Basophils Absolute: 0.1 10*3/uL (ref 0.0–0.1)
Basophils Relative: 0 % (ref 0–1)
Basophils Relative: 0 % (ref 0–1)
Basophils Relative: 0 % (ref 0–1)
Basophils Relative: 1 % (ref 0–1)
Basophils Relative: 1 % (ref 0–1)
Eosinophils Absolute: 0.1 10*3/uL (ref 0.0–0.7)
Eosinophils Absolute: 0.1 10*3/uL (ref 0.0–0.7)
Eosinophils Absolute: 0.2 10*3/uL (ref 0.0–0.7)
Eosinophils Absolute: 0.3 10*3/uL (ref 0.0–0.7)
Eosinophils Absolute: 0.3 10*3/uL (ref 0.0–0.7)
Eosinophils Relative: 1 % (ref 0–5)
Eosinophils Relative: 2 % (ref 0–5)
Eosinophils Relative: 4 % (ref 0–5)
Eosinophils Relative: 4 % (ref 0–5)
Eosinophils Relative: 4 % (ref 0–5)
Lymphocytes Relative: 11 % — ABNORMAL LOW (ref 12–46)
Lymphocytes Relative: 12 % (ref 12–46)
Lymphocytes Relative: 18 % (ref 12–46)
Lymphocytes Relative: 21 % (ref 12–46)
Lymphocytes Relative: 21 % (ref 12–46)
Lymphs Abs: 0.8 10*3/uL (ref 0.7–4.0)
Lymphs Abs: 1.2 10*3/uL (ref 0.7–4.0)
Lymphs Abs: 1.3 10*3/uL (ref 0.7–4.0)
Lymphs Abs: 1.4 10*3/uL (ref 0.7–4.0)
Lymphs Abs: 1.4 10*3/uL (ref 0.7–4.0)
Monocytes Absolute: 0.5 10*3/uL (ref 0.1–1.0)
Monocytes Absolute: 0.5 10*3/uL (ref 0.1–1.0)
Monocytes Absolute: 0.6 10*3/uL (ref 0.1–1.0)
Monocytes Absolute: 0.6 10*3/uL (ref 0.1–1.0)
Monocytes Absolute: 0.7 10*3/uL (ref 0.1–1.0)
Monocytes Relative: 11 % (ref 3–12)
Monocytes Relative: 4 % (ref 3–12)
Monocytes Relative: 9 % (ref 3–12)
Monocytes Relative: 9 % (ref 3–12)
Monocytes Relative: 9 % (ref 3–12)
Neutro Abs: 10.4 10*3/uL — ABNORMAL HIGH (ref 1.7–7.7)
Neutro Abs: 3.9 10*3/uL (ref 1.7–7.7)
Neutro Abs: 4.4 10*3/uL (ref 1.7–7.7)
Neutro Abs: 4.5 10*3/uL (ref 1.7–7.7)
Neutro Abs: 5.4 10*3/uL (ref 1.7–7.7)
Neutrophils Relative %: 65 % (ref 43–77)
Neutrophils Relative %: 65 % (ref 43–77)
Neutrophils Relative %: 67 % (ref 43–77)
Neutrophils Relative %: 77 % (ref 43–77)
Neutrophils Relative %: 84 % — ABNORMAL HIGH (ref 43–77)
Smear Review: DECREASED

## 2010-08-10 LAB — URINALYSIS, ROUTINE W REFLEX MICROSCOPIC
Bilirubin Urine: NEGATIVE
Glucose, UA: >1000 mg/dL — AB
Glucose, UA: NEGATIVE mg/dL
Hgb urine dipstick: NEGATIVE
Hgb urine dipstick: NEGATIVE
Ketones, ur: NEGATIVE mg/dL
Ketones, ur: 15 mg/dL — AB
Leukocytes, UA: NEGATIVE
Nitrite: NEGATIVE
Nitrite: POSITIVE — AB
Protein, ur: NEGATIVE mg/dL
Protein, ur: NEGATIVE mg/dL
Specific Gravity, Urine: 1.034 — ABNORMAL HIGH (ref 1.005–1.030)
Specific Gravity, Urine: 1.016 (ref 1.005–1.030)
Urobilinogen, UA: 0.2 mg/dL (ref 0.0–1.0)
Urobilinogen, UA: 1 mg/dL (ref 0.0–1.0)
pH: 6.5 (ref 5.0–8.0)
pH: 5.5 (ref 5.0–8.0)

## 2010-08-10 LAB — CULTURE, BLOOD (ROUTINE X 2)
Culture: NO GROWTH
Culture: NO GROWTH

## 2010-08-10 LAB — OSMOLALITY: Osmolality: 243 mOsm/kg — ABNORMAL LOW (ref 275–300)

## 2010-08-10 LAB — GLUCOSE, CAPILLARY
Glucose-Capillary: 184 mg/dL — ABNORMAL HIGH (ref 70–99)
Glucose-Capillary: 191 mg/dL — ABNORMAL HIGH (ref 70–99)
Glucose-Capillary: 192 mg/dL — ABNORMAL HIGH (ref 70–99)
Glucose-Capillary: 228 mg/dL — ABNORMAL HIGH (ref 70–99)
Glucose-Capillary: 230 mg/dL — ABNORMAL HIGH (ref 70–99)
Glucose-Capillary: 241 mg/dL — ABNORMAL HIGH (ref 70–99)
Glucose-Capillary: 256 mg/dL — ABNORMAL HIGH (ref 70–99)
Glucose-Capillary: 316 mg/dL — ABNORMAL HIGH (ref 70–99)
Glucose-Capillary: 354 mg/dL — ABNORMAL HIGH (ref 70–99)
Glucose-Capillary: 398 mg/dL — ABNORMAL HIGH (ref 70–99)
Glucose-Capillary: 479 mg/dL — ABNORMAL HIGH (ref 70–99)
Glucose-Capillary: 503 mg/dL — ABNORMAL HIGH (ref 70–99)
Glucose-Capillary: 573 mg/dL (ref 70–99)
Glucose-Capillary: >600 mg/dL (ref 70–99)

## 2010-08-10 LAB — BLOOD GAS, ARTERIAL
Acid-Base Excess: 0.9 mmol/L (ref 0.0–2.0)
Acid-base deficit: 0.5 mmol/L (ref 0.0–2.0)
Bicarbonate: 22.7 mEq/L (ref 20.0–24.0)
Bicarbonate: 24.2 mEq/L — ABNORMAL HIGH (ref 20.0–24.0)
Drawn by: 280971
Drawn by: 31530
FIO2: 0.21 %
FIO2: 0.21 %
O2 Saturation: 94.1 %
O2 Saturation: 96 %
Patient temperature: 98.6
Patient temperature: 98.6
TCO2: 23.7 mmol/L (ref 0–100)
TCO2: 25.2 mmol/L (ref 0–100)
pCO2 arterial: 31.5 mmHg — ABNORMAL LOW (ref 35.0–45.0)
pCO2 arterial: 33.7 mmHg — ABNORMAL LOW (ref 35.0–45.0)
pH, Arterial: 7.47 — ABNORMAL HIGH (ref 7.350–7.400)
pH, Arterial: 7.471 — ABNORMAL HIGH (ref 7.350–7.400)
pO2, Arterial: 61.5 mmHg — ABNORMAL LOW (ref 80.0–100.0)
pO2, Arterial: 75.6 mmHg — ABNORMAL LOW (ref 80.0–100.0)

## 2010-08-10 LAB — HEPATITIS PANEL, ACUTE
HCV Ab: NEGATIVE
Hep A IgM: NEGATIVE
Hep B C IgM: NEGATIVE
Hepatitis B Surface Ag: NEGATIVE

## 2010-08-10 LAB — HEPATIC FUNCTION PANEL
ALT: 66 U/L — ABNORMAL HIGH (ref 0–35)
ALT: 37 U/L — ABNORMAL HIGH (ref 0–35)
ALT: 41 U/L — ABNORMAL HIGH (ref 0–35)
AST: 78 U/L — ABNORMAL HIGH (ref 0–37)
AST: 128 U/L — ABNORMAL HIGH (ref 0–37)
AST: 139 U/L — ABNORMAL HIGH (ref 0–37)
Albumin: 2.6 g/dL — ABNORMAL LOW (ref 3.5–5.2)
Albumin: 1.5 g/dL — ABNORMAL LOW (ref 3.5–5.2)
Albumin: 1.6 g/dL — ABNORMAL LOW (ref 3.5–5.2)
Alkaline Phosphatase: 185 U/L — ABNORMAL HIGH (ref 39–117)
Alkaline Phosphatase: 248 U/L — ABNORMAL HIGH (ref 39–117)
Alkaline Phosphatase: 253 U/L — ABNORMAL HIGH (ref 39–117)
Bilirubin, Direct: 3.1 mg/dL — ABNORMAL HIGH (ref 0.0–0.3)
Bilirubin, Direct: 8.6 mg/dL — ABNORMAL HIGH (ref 0.0–0.3)
Bilirubin, Direct: 9.8 mg/dL — ABNORMAL HIGH (ref 0.0–0.3)
Indirect Bilirubin: 3.3 mg/dL — ABNORMAL HIGH (ref 0.3–0.9)
Indirect Bilirubin: 8.7 mg/dL — ABNORMAL HIGH (ref 0.3–0.9)
Indirect Bilirubin: 9.2 mg/dL — ABNORMAL HIGH (ref 0.3–0.9)
Total Bilirubin: 6.4 mg/dL — ABNORMAL HIGH (ref 0.3–1.2)
Total Bilirubin: 17.3 mg/dL — ABNORMAL HIGH (ref 0.3–1.2)
Total Bilirubin: 19 mg/dL (ref 0.3–1.2)
Total Protein: 6.7 g/dL (ref 6.0–8.3)
Total Protein: 5.8 g/dL — ABNORMAL LOW (ref 6.0–8.3)
Total Protein: 6.1 g/dL (ref 6.0–8.3)

## 2010-08-10 LAB — BRAIN NATRIURETIC PEPTIDE: Pro B Natriuretic peptide (BNP): 146 pg/mL — ABNORMAL HIGH (ref 0.0–100.0)

## 2010-08-10 LAB — PHOSPHORUS
Phosphorus: 2.5 mg/dL (ref 2.3–4.6)
Phosphorus: 2 mg/dL — ABNORMAL LOW (ref 2.3–4.6)
Phosphorus: 2.4 mg/dL (ref 2.3–4.6)
Phosphorus: 2.6 mg/dL (ref 2.3–4.6)

## 2010-08-10 LAB — URINE CULTURE: Colony Count: 3000

## 2010-08-10 LAB — SODIUM, URINE, RANDOM: Sodium, Ur: <10 mEq/L

## 2010-08-10 LAB — PREALBUMIN: Prealbumin: 4.1 mg/dL — ABNORMAL LOW (ref 18.0–45.0)

## 2010-08-10 LAB — RAPID URINE DRUG SCREEN, HOSP PERFORMED
Amphetamines: NOT DETECTED
Barbiturates: NOT DETECTED
Benzodiazepines: NOT DETECTED
Cocaine: NOT DETECTED
Opiates: NOT DETECTED
Tetrahydrocannabinol: NOT DETECTED

## 2010-08-10 LAB — CLOSTRIDIUM DIFFICILE EIA: C difficile Toxins A+B, EIA: NEGATIVE

## 2010-08-10 LAB — URINE MICROSCOPIC-ADD ON

## 2010-08-10 LAB — VITAMIN B12: Vitamin B-12: >2000 pg/mL — ABNORMAL HIGH (ref 211–911)

## 2010-08-10 LAB — TSH: TSH: 3.539 u[IU]/mL (ref 0.350–4.500)

## 2010-08-10 LAB — AFP TUMOR MARKER: AFP-Tumor Marker: 1.9 ng/mL (ref 0.0–8.0)

## 2010-08-10 LAB — MAGNESIUM
Magnesium: 2 mg/dL (ref 1.5–2.5)
Magnesium: 2 mg/dL (ref 1.5–2.5)
Magnesium: 2.1 mg/dL (ref 1.5–2.5)
Magnesium: 2.2 mg/dL (ref 1.5–2.5)
Magnesium: 2.3 mg/dL (ref 1.5–2.5)

## 2010-08-10 LAB — HEMOGLOBIN A1C
Hgb A1c MFr Bld: 5.3 % (ref <5.7)
Mean Plasma Glucose: 105 mg/dL (ref <117)

## 2010-08-10 LAB — OSMOLALITY, URINE: Osmolality, Ur: 282 mOsm/kg — ABNORMAL LOW (ref 390–1090)

## 2010-08-10 LAB — SALICYLATE LEVEL: Salicylate Lvl: 9.5 mg/dL (ref 2.8–20.0)

## 2010-08-10 LAB — LIPASE, BLOOD: Lipase: 71 U/L — ABNORMAL HIGH (ref 11–59)

## 2010-08-10 LAB — IRON: Iron: 123 ug/dL (ref 42–135)

## 2010-08-10 LAB — APTT: aPTT: 38 seconds — ABNORMAL HIGH (ref 24–37)

## 2010-08-10 LAB — URIC ACID: Uric Acid, Serum: 2.3 mg/dL — ABNORMAL LOW (ref 2.4–7.0)

## 2010-08-10 LAB — ETHANOL: Alcohol, Ethyl (B): <5 mg/dL (ref 0–10)

## 2010-08-10 LAB — FERRITIN: Ferritin: 417 ng/mL — ABNORMAL HIGH (ref 10–291)

## 2010-08-10 LAB — FOLATE RBC: RBC Folate: 581 ng/mL (ref 180–600)

## 2010-08-10 LAB — ACETAMINOPHEN LEVEL: Acetaminophen (Tylenol), Serum: <10 ug/mL — ABNORMAL LOW (ref 10–30)

## 2010-08-10 LAB — MRSA PCR SCREENING: MRSA by PCR: POSITIVE — AB

## 2010-08-10 LAB — AMYLASE: Amylase: 65 U/L (ref 0–105)

## 2010-10-05 NOTE — Op Note (Signed)
NAME:  Katelyn Stevenson, Katelyn Stevenson                ACCOUNT NO.:  [id number]   MEDICAL RECORD NO.:  [id number]          PATIENT TYPE:  AMB   LOCATION:  ENDO                         FACILITY:  [facility]   PHYSICIAN:  Anselmo Rod, M.D.  DATE OF BIRTH:  02-Feb-1956   DATE OF PROCEDURE:  09/29/2006  DATE OF DISCHARGE:                               OPERATIVE REPORT   PROCEDURE PERFORMED:  Colonoscopy with snare polypectomy x1 and cold  biopsies x3.   ENDOSCOPIST:  Anselmo Rod, M.D.   INSTRUMENT USED:  Pentax video colonoscope.   INDICATIONS FOR PROCEDURE:  55 year old white female with a history of  rectal bleeding and change in bowel habits undergoing screening  colonoscopy to rule out colonic polyps, masses, etc.   PREPROCEDURE PREPARATION:  Informed consent was procured from the  patient.  The patient was fasted for 8 hours prior to the procedure and  prepped with a gallon of NuLytely the night prior to the procedure.  The  risks and benefits of the procedure including a 10% miss rate of cancer  and polyps were discussed with the patient, as well.   PREPROCEDURE PHYSICAL:  The patient had stable vital signs.  Neck  supple.  Chest clear to auscultation.  S1 and S2 regular.  Abdomen soft  with normal bowel sounds   DESCRIPTION OF PROCEDURE:  The patient was placed in left lateral  decubitus position and sedated with 75 mcg of Fentanyl and 7.5 mg of  Versed given intravenously in slow incremental doses. Once the patient  was adequately sedated and maintained on low flow oxygen and continuous  cardiac monitoring, the Pentax video colonoscope was advanced from the  rectum to cecum with difficulty.  There was a significant amount of  residual stool in the colon.  Multiple washes were done.  Three small  sessile polyps were biopsied in the rectosigmoid colon and one slightly  larger sessile polyp was removed by hot snare.  A few sigmoid  diverticula seen.  The transverse colon, right colon, cecum  and terminal  ileum appeared normal.  However, as there was a large amount of adherent  stool in the colon, small lesions could be missed.  Small internal  hemorrhoids were seen on retroflexion in the rectum.  The patient  tolerated the procedure well without immediate complication.   IMPRESSION:  1. Three small sessile polyps biopsied from the rectosigmoid colon and      one removed by hot snare.  2. Few sigmoid diverticula.  3. No large masses or polyps seen in the transverse colon, right colon      and cecum.  4. Normal terminal ileum.  5. Small internal hemorrhoids seen on retroflexion.   RECOMMENDATIONS:  1. Await pathology results.  2. Avoid all nonsteroidals including aspirin of the next two weeks.  3. Repeat colonoscopy depending on pathology results.  4. Outpatient follow-up in the next two weeks for further      recommendations.      Anselmo Rod, M.D.  Electronically Signed     JNM/MEDQ  D:  09/30/2006  T:  09/30/2006  Job:  [id number]   cc:   Marjory Lies, M.D.  Kathryne Hitch, MD

## 2010-10-08 NOTE — Discharge Summary (Signed)
[facility]  Patient:    Katelyn Stevenson                       MRN: [id number] Adm. Date:  [id number] Disc. Date: [id number] Attending:  Doug Sou                           Discharge Summary  INTRODUCTION:  Katelyn Stevenson is a 55 year old married female who was admitted on involuntary papers on July 05, 2000.  Patient was in emergency room complaining of pain and she made a statement that "life is not worth living while in such pain."  It was interpreted as suicidal ideation which patient denied.  Today, during the interview she denied suicidal or homicidal ideations, denied hallucinations.  Patient has been treated in the past for substance abuse of crystal meth in 1996, otherwise she does not have any significant psychiatric history.  She is under care of rheumatologist, Dr. Phylliss Bob, for arthritis.  Prior to admission she was on Xanax 0.5 mg at bedtime, Celebrex 100 mg twice a day.  Paxil, amitriptyline, and Wellbutrin in the past with no therapeutic effects.  Initial examination did not reveal suicidal ideation or psychosis.  Case worker Tereasa Coop talked to patients husband in the morning on February 14 upon Mrs. Morrisons request.  Mrs. Klink wanted to be discharged from the hospital, feeling that her admission was a mistake.  She felt that she could be treated on a outpatient basis.  After talking to the patient, and I reviewed the chart, I felt the same.  A nurse practitioner talked to her rheumatologist who was very perceptive in obtaining a quick appointment for the patient and she is going to see him in early February for better pain management.  All things considered, it appeared to be safe to discharge her to home.  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 Axis II:   Personality disorder not otherwise specified. Axis III:  Fibromyalgia, spondylitis,  arthritis, and degenerative disk            disease. Axis IV:   Moderate stressors, medical problems. Axis V:    Global assessment of function at time of admission was 40, for            past year was 55.  REVIEW OF ADDITIONAL WORKUP:  Patient was checked in emergency room and physical examination was generally normal.  She had a chemistry-7 which was normal.  Hemoglobin and hematocrit was also within normal limits.  Urine drug screen was positive for benzodiazepines which was not a surprise as the patient is taking Xanax.  During this brief hospital stay, patients vital signs were stable, with blood pressure 140/92 and pulse 94.  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 Axis II:   Personality disorder not otherwise specified. Axis III:  Fibromyalgia, spondylitis, arthritis, and degenerative disk            disease. Axis IV:   Moderate stressors, medical problems. Axis V:    Global assessment of function at time of admission was 40, for            past year was 55 maximum, upon discharge 50-55.  DISCHARGE RECOMMENDATIONS:  Patient received a prescription for Remeron soltabs 30 mg to take one at bedtime, Celebrex twice a day, and Xanax 0.5 mg twice a day.  She has appointment at [facility] next day on July 07, 2000 at 9 a.m. and she is supposed to follow with her rheumatologist on Monday, February 25.  Patient understood instructions and in good condition was discharged to care of her husband. DD:  07/17/00 TD:  07/18/00 Job: [id number]

## 2011-02-25 LAB — GLUCOSE, CAPILLARY: Glucose-Capillary: 119 mg/dL — ABNORMAL HIGH (ref 70–99)

## 2011-08-01 IMAGING — CT CT ABD-PELV W/ CM
2 of 5 series · 13 of 32 positions shown, 18 images · IV contrast (water/omni  & 100 ML OMNI 300)
Comparison: Ultrasound 09/08/2009.

CLINICAL DATA: Right-sided abdominal pain.  Jaundice.  Acute
alcoholic hepatitis.  History of cholecystectomy and Crohn's
disease.

CT ABDOMEN AND PELVIS WITH CONTRAST
TECHNIQUE: Multidetector CT imaging of the abdomen and pelvis was
performed following the standard protocol during bolus
administration of intravenous contrast.
Contrast: 100  ml [id number]

[Series 2: routine abdomen · axial · 0.98mm/px · z∈[-455,-105]mm · 5 of 106 slices shown, 10 images]
[im 18/106  soft-tissue]
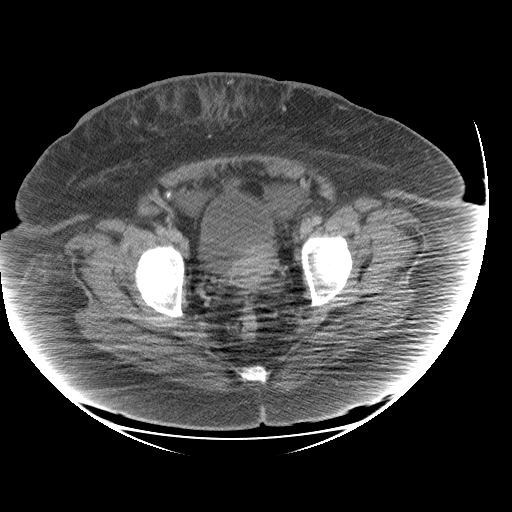
[im 18/106  bone]
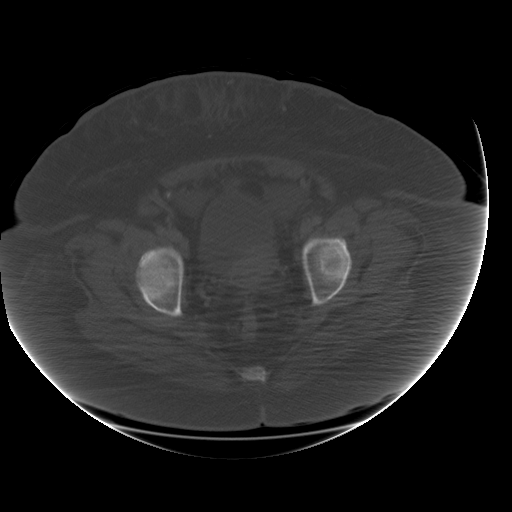
[im 36/106  soft-tissue]
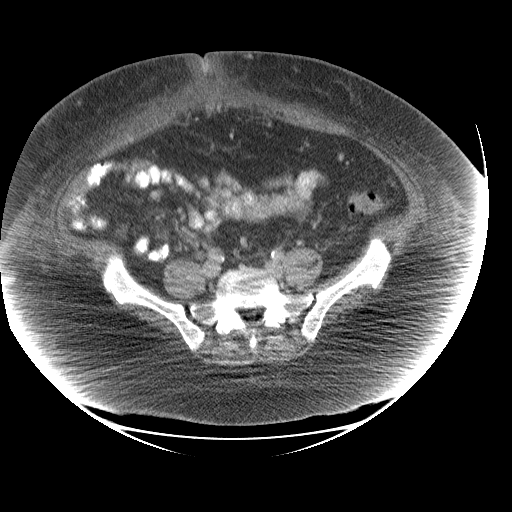
[im 36/106  lung]
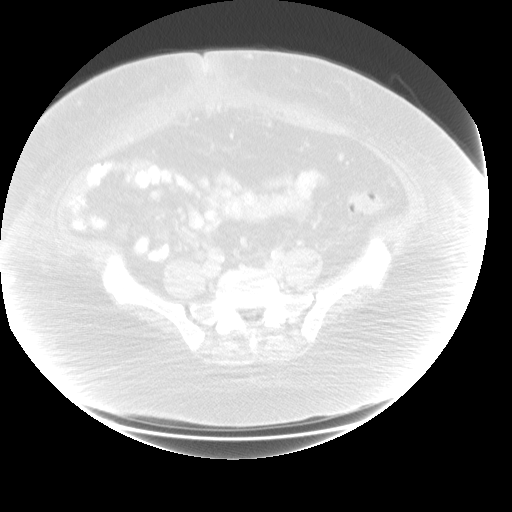
[im 53/106  soft-tissue]
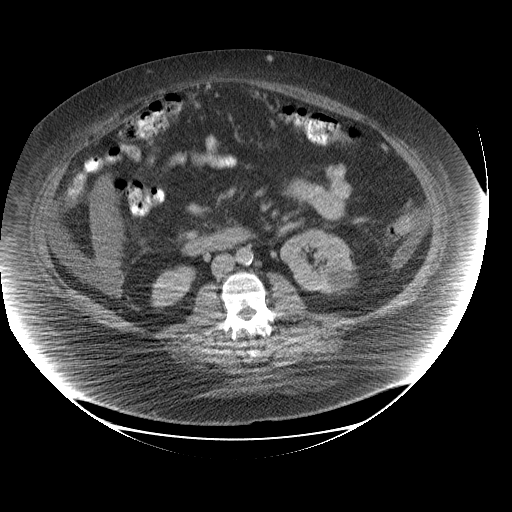
[im 53/106  lung]
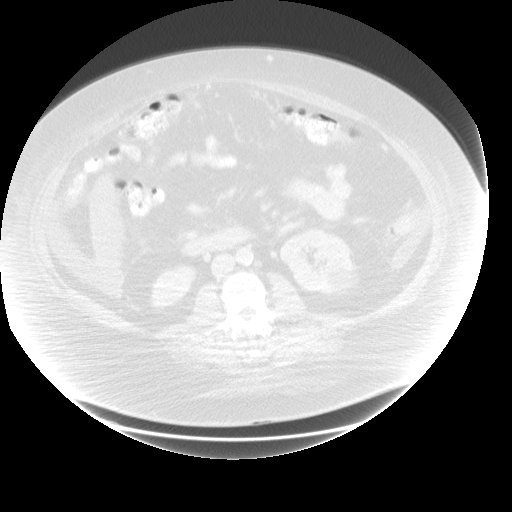
[im 71/106  soft-tissue]
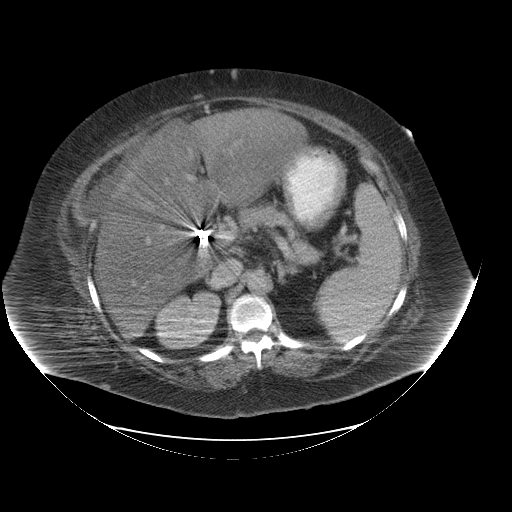
[im 71/106  lung]
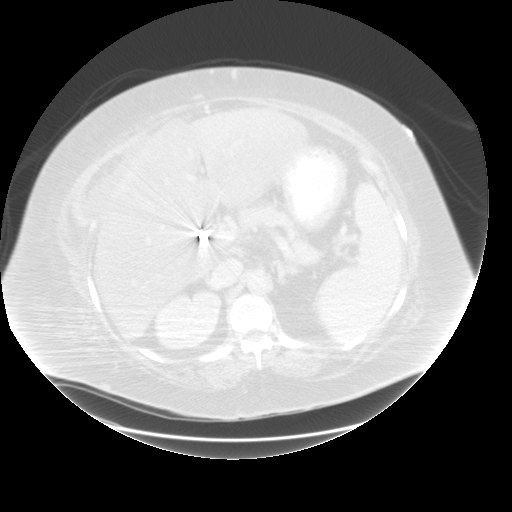
[im 88/106  soft-tissue]
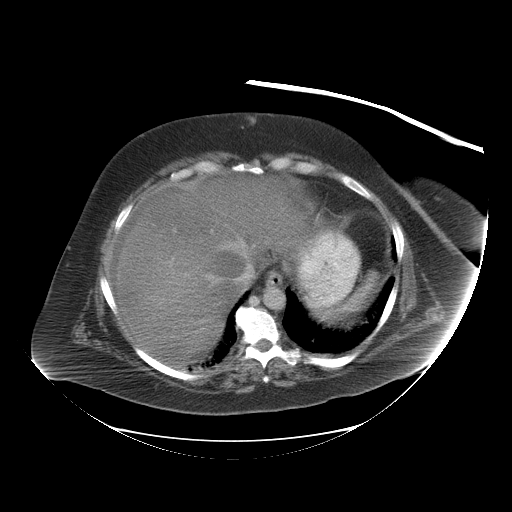
[im 88/106  lung]
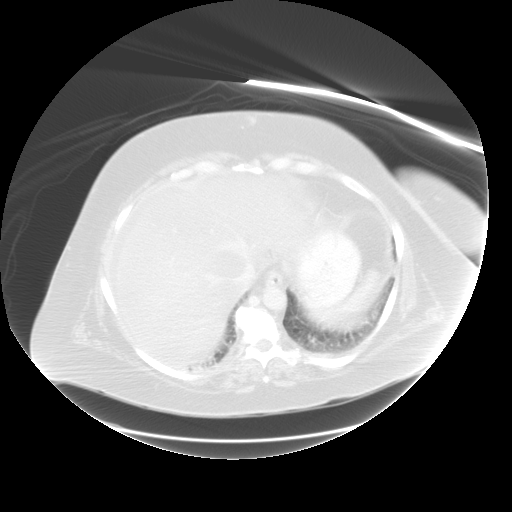

[Series 400: sagittal a/p · sagittal · 1.05mm/px · 8 of 132 slices shown]
[im 15/132  soft-tissue]
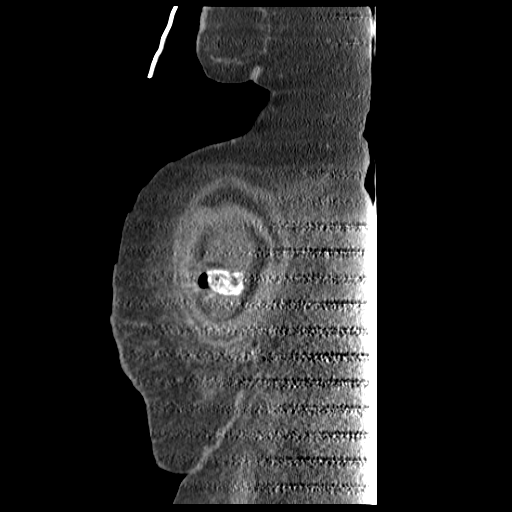
[im 30/132  soft-tissue]
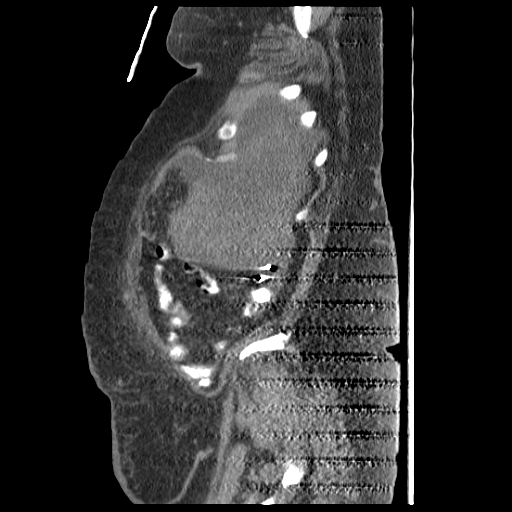
[im 44/132  soft-tissue]
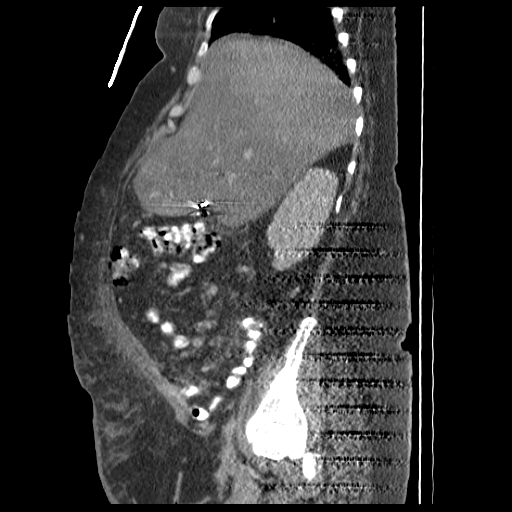
[im 59/132  soft-tissue]
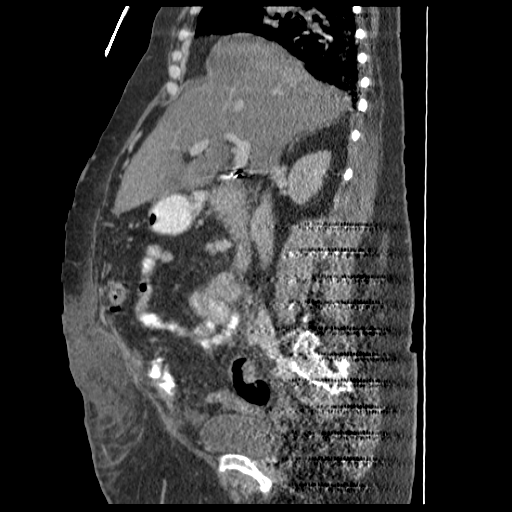
[im 73/132  soft-tissue]
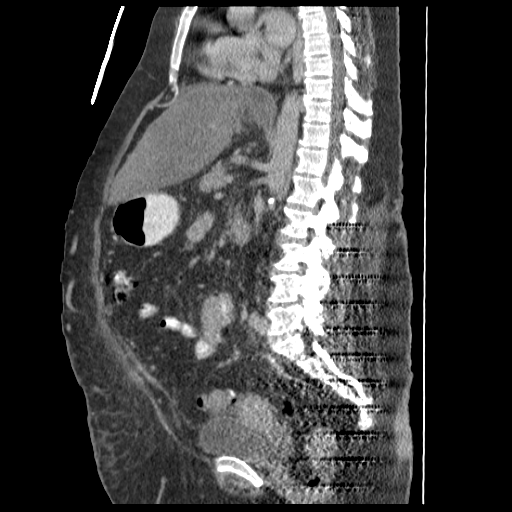
[im 88/132  soft-tissue]
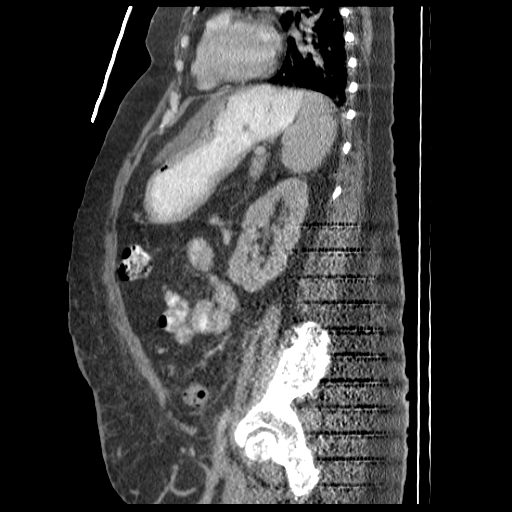
[im 102/132  soft-tissue]
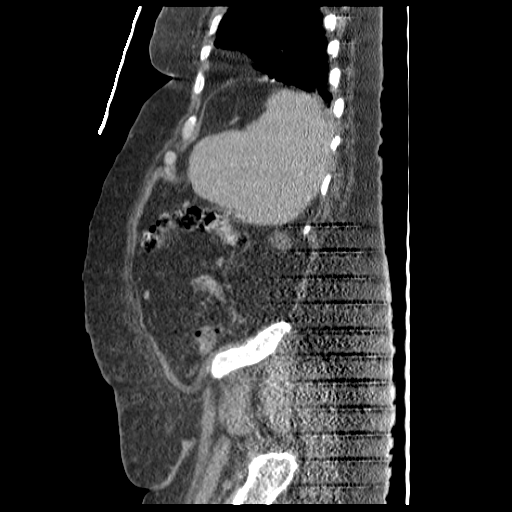
[im 117/132  soft-tissue]
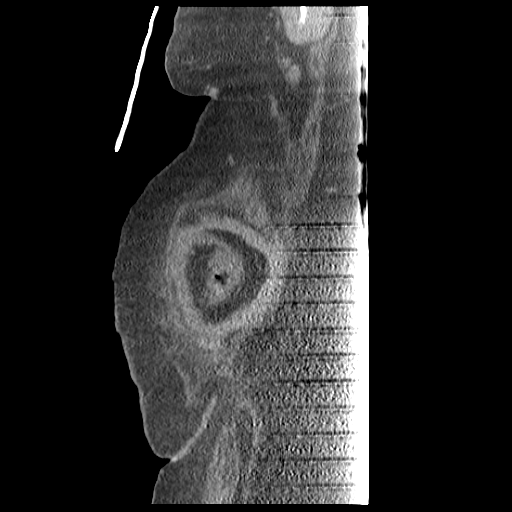

[13 of 32 positions shown; findings below may reference images not displayed]

FINDINGS: Mildly degraded evaluation of the abdomen and moderately
degraded evaluation of the pelvis secondary patient body habitus.

Mild bibasilar atelectasis.

Mild cardiomegaly without pericardial or pleural effusion.  Severe
fatty infiltration of the liver.  Relative fat sparing involving
the hepatic dome posteriorly including on image 16.  Marked
hepatomegaly, with the liver measuring 26 cm cranial caudal.  No
focal liver lesion.  No evidence of portal vein or hepatic vein
thrombus.  There is no convincing evidence of cirrhosis.
Recanalized paraumbilical vein including on image 39 which may
relate to portal venous hypertension.

Splenomegaly, 14.4 cm cranial caudal.  15.0 x 7.6 cm transverse.

Normal stomach, pancreas.  Cholecystectomy without intra or
extrahepatic biliary ductal dilatation.  Normal adrenal glands and
right kidney.  An interpolar left renal lesion measures 4.5 cm
greater than fluid density at 24 - 27 HU.

No retroperitoneal adenopathy.  Mildly prominent porta hepatis
lymph nodes which are likely reactive.

Sigmoid diverticulosis.  No evidence of bowel obstruction or
definite wall thickening.  Small perihepatic and perisplenic
ascites.

Prominent inguinal lymph nodes.  Right-sided node measures 1.3 cm
on image 93.  Likely reactive. Normal urinary bladder and uterus.
No adnexal mass.  Trace free pelvic fluid.  Diffuse anasarca.
IMPRESSION: 1.  Mild to moderately degraded exam due to patient body habitus.
2.  Severe fatty infiltration of the liver with hepatomegaly and
splenomegaly.
3.  Although there is no definite evidence of cirrhosis, a
recanalized paraumbilical vein suggests a component of portal
venous hypertension.  No other specific findings of portal venous
hypertension identified.
4.  Small amount of ascites.
5.  Probable complex left renal cyst.  Consider ultrasound
surveillance (i.e. in approximately 6 months) to confirm size
stability.

## 2011-09-20 ENCOUNTER — Encounter (HOSPITAL_COMMUNITY): Payer: Self-pay

## 2011-09-20 ENCOUNTER — Emergency Department (INDEPENDENT_AMBULATORY_CARE_PROVIDER_SITE_OTHER): Payer: BC Managed Care – PPO

## 2011-09-20 ENCOUNTER — Emergency Department (HOSPITAL_COMMUNITY)
Admission: EM | Admit: 2011-09-20 | Discharge: 2011-09-20 | Disposition: A | Discharge status: Home Health | Payer: Self-pay | Source: Home / Self Care

## 2011-09-20 DIAGNOSIS — S92902A Unspecified fracture of left foot, initial encounter for closed fracture: Secondary | ICD-10-CM

## 2011-09-20 DIAGNOSIS — S92909A Unspecified fracture of unspecified foot, initial encounter for closed fracture: Secondary | ICD-10-CM

## 2011-09-20 HISTORY — DX: Other chronic pain: G89.29

## 2011-09-20 HISTORY — DX: Essential (primary) hypertension: I10

## 2011-09-20 HISTORY — DX: Anxiety disorder, unspecified: F41.9

## 2011-09-20 HISTORY — DX: Fibromyalgia: M79.7

## 2011-09-20 NOTE — Discharge Instructions (Signed)
Thank you for coming in today. You have a fracture of the foot bone. Please wear flat bottom shoe.  Followup with Dr. Lajoyce Corners or your orthopedic surgeon in a few days to weeks.  Go to orthopedics sooner if you get worse. ' Foot Fracture Your caregiver has diagnosed you as having a foot fracture (broken bone). Your foot has many bones. You have a fracture, or break, in one of these bones. In some cases, your doctor may put on a splint or removable fracture boot until the swelling in your foot has lessened. A cast may or may not be required. HOME CARE INSTRUCTIONS  If you do not have a cast or splint:  You may bear weight on your injured foot as tolerated or advised.   Do not put any weight on your injured foot for as long as directed by your caregiver. Slowly increase the amount of time you walk on the foot as the pain and swelling allows or as advised.   Use crutches until you can bear weight without pain. A gradual increase in weight bearing may help.   Apply ice to the injury for 15 to 20 minutes each hour while awake for the first 2 days. Put the ice in a plastic bag and place a towel between the bag of ice and your skin.   If an ace bandage (stretchy, elastic wrapping bandage) was applied, you may re-wrap it if ankle is more painful or your toes become cold and swollen.  If you have a cast or splint:  Use your crutches for as long as directed by your caregiver.   To lessen the swelling, keep the injured foot elevated on pillows while lying down or sitting. Elevate your foot above your heart.   Apply ice to the injury for 15 to 20 minutes each hour while awake for the first 2 days. Put the ice in a plastic bag and place a thin towel between the bag of ice and your cast.   Plaster or fiberglass cast:   Do not try to scratch the skin under the cast using a sharp or pointed object down the cast.   Check the skin around the cast every day. You may put lotion on any red or sore areas.    Keep your cast clean and dry.   Plaster splint:   Wear the splint until you are seen for a follow-up examination.   You may loosen the elastic around the splint if your toes become numb, tingle, or turn blue or cold. Do not rest it on anything harder than a pillow in the first 24 hours.   Do not put pressure on any part of your splint. Use your crutches as directed.   Keep your splint dry. It can be protected during bathing with a plastic bag. Do not lower the splint into water.   If you have a fracture boot you may remove it to shower. Bear weight only as instructed by your caregiver.   Only take over-the-counter or prescription medicines for pain, discomfort, or fever as directed by your caregiver.  SEEK IMMEDIATE MEDICAL CARE IF:   Your cast gets damaged or breaks.   You have continued severe pain or more swelling than you did before the cast was put on.   Your skin or nails of your casted foot turn blue, gray, feel cold or numb.   There is a bad smell from your cast.   There is severe pain with movement of your  toes.   There are new stains and/or drainage coming from under the cast.  MAKE SURE YOU:   Understand these instructions.   Will watch your condition.   Will get help right away if you are not doing well or get worse.  Document Released: 05/06/2000 Document Revised: 04/28/2011 Document Reviewed: 06/12/2008 [facility] Patient Information 2012 [place], [place].

## 2011-09-20 NOTE — ED Provider Notes (Signed)
Katelyn Stevenson is a 56 y.o. female who presents to Urgent Care today for left ankle and foot pain. Patient was walking up some stairs approximately 5 days ago when she tripped and fell going up the stairs. She thinks she may have had an inversion injury to her left foot. She has had continued pain at the lateral foot and ankle as well as the anterior dorsal part of the foot.  She notes continued pain and difficulty walking.   PMH reviewed. Multiple medical problems including diabetes, hypertension, anxiety, and chronic pain ROS as above otherwise neg.  no chest pains, palpitations, fevers, chills, abdominal pain nausea or vomiting. Medications reviewed. No current facility-administered medications for this encounter.   Current Outpatient Prescriptions  Medication Sig Dispense Refill  . DIAZEPAM PO Take by mouth.      . FUROSEMIDE PO Take by mouth.      . insulin glargine (LANTUS) 100 UNIT/ML injection Inject into the skin at bedtime.      Marland Kitchen LACTULOSE PO Take by mouth.      . oxyCODONE (OXYCONTIN) 10 MG 12 hr tablet Take 10 mg by mouth every 12 (twelve) hours.      . SPIRONOLACTONE PO Take by mouth.        Exam:  BP 128/75  Pulse 90  Temp(Src) 98.4 F (36.9 C) (Oral)  Resp 22  SpO2 95% Gen: Well NAD LEFT FOOT: Mild swelling. Tender at the posterior aspect of the lateral malleolus and at the base of the fifth metatarsal and head of the second and third metatarsals. Pain with talar tilt and anterior drawer however no significant joint laxity noted.  No results found for this or any previous visit (from the past 24 hour(s)). Dg Ankle Complete Left  09/20/2011  *RADIOLOGY REPORT*  Clinical Data: Anterior and lateral malleolar pain post fall on 09/14/2011  LEFT ANKLE COMPLETE - 3+ VIEW  Comparison: None  Findings: Diffuse soft tissue swelling. Ankle mortise intact. Small rounded ossicle at lateral joint line. Plantar and Achilles insertion calcaneal spurs. No definite acute fracture,  dislocation, or bone destruction. Osseous mineralization grossly normal.  IMPRESSION: Calcaneal spurring. No acute osseous findings.  Original Report Authenticated By: Lollie Marrow, M.D.   Dg Foot Complete Left  09/20/2011  *RADIOLOGY REPORT*  Clinical Data: Pain at base of fifth metatarsal  LEFT FOOT - COMPLETE 3+ VIEW  Comparison: None.  Findings:  There is a oblique minimally displaced fracture of the diaphysis of the fifth metatarsal without definite intra-articular extension. There is expected adjacent soft tissue swelling. No radiopaque foreign body.  An accessory os tibiale externum is suspected.  No other fractures are identified.  Joint spaces are preserved.  Incidental note is made of enthesopathic change at the Achilles tendon insertion site.  Small plantar calcaneal spur.  IMPRESSION: Oblique, minimally displaced fracture the diaphysis of the fifth metatarsal without definite intra-articular extension.  Original Report Authenticated By: Waynard Reeds, M.D.    Assessment and Plan: 56 y.o. female with minimally displaced left fifth metatarsal midshaft fracture.  Plan to place patient in postop shoe and followup with orthopedics as soon as possible. She already has heavy doses of oxycodone for her chronic pain therefore we'll not prescribe extra pain medicine.  Patient expresses understanding and will followup. Handout provided.     Rodolph Bong, MD 09/20/11 Ernestina [place]

## 2011-09-20 NOTE — ED Notes (Signed)
Pt c/o L foot pain following falling UP the stairs last Wednesday.  Pt states pain increases with movement and weight bearing.

## 2011-09-21 NOTE — ED Provider Notes (Signed)
Medical screening examination/treatment/procedure(s) were performed by PGY-3 FM resident and as supervising physician I was immediately available for consultation/collaboration.   Damond Borchers Moreno-Coll, MD   Budd Freiermuth Moreno-Coll, MD 09/21/11 0812 

## 2011-11-10 ENCOUNTER — Ambulatory Visit (HOSPITAL_COMMUNITY)
Admission: RE | Admit: 2011-11-10 | Discharge: 2011-11-10 | Disposition: A | Discharge status: Home / Self Care | Payer: BC Managed Care – PPO | Source: Ambulatory Visit | Attending: Family Medicine | Admitting: Family Medicine

## 2011-11-10 MED ORDER — LIDOCAINE HCL (PF) 1 % IJ SOLN: 2.0000 mL | Freq: Once | INTRAMUSCULAR | Status: DC

## 2011-11-10 MED ORDER — LIDOCAINE HCL (PF) 1 % IJ SOLN
INTRAMUSCULAR | Status: AC
  Filled 2011-11-10: qty 5

## 2011-11-10 NOTE — Progress Notes (Signed)
One unit theraputic phlebotoby complete using right antecub; lidocaine injected prior to phlebotomy; pt tolerated well

## 2011-12-02 ENCOUNTER — Encounter (HOSPITAL_COMMUNITY): Payer: BC Managed Care – PPO

## 2011-12-05 ENCOUNTER — Other Ambulatory Visit (HOSPITAL_COMMUNITY): Payer: Self-pay | Admitting: *Deleted

## 2011-12-06 ENCOUNTER — Encounter (HOSPITAL_COMMUNITY): Payer: BC Managed Care – PPO

## 2012-04-24 ENCOUNTER — Other Ambulatory Visit: Payer: Self-pay | Admitting: Family Medicine

## 2012-04-24 DIAGNOSIS — J329 Chronic sinusitis, unspecified: Secondary | ICD-10-CM

## 2012-09-12 ENCOUNTER — Encounter (HOSPITAL_COMMUNITY): Payer: Self-pay | Admitting: Adult Health

## 2012-09-12 ENCOUNTER — Emergency Department (HOSPITAL_COMMUNITY)
Admission: EM | Admit: 2012-09-12 | Discharge: 2012-09-12 | Disposition: A | Discharge status: Home / Self Care | Payer: Managed Care, Other (non HMO) | Attending: Emergency Medicine | Admitting: Emergency Medicine

## 2012-09-12 DIAGNOSIS — E119 Type 2 diabetes mellitus without complications: Secondary | ICD-10-CM | POA: Insufficient documentation

## 2012-09-12 DIAGNOSIS — I10 Essential (primary) hypertension: Secondary | ICD-10-CM | POA: Insufficient documentation

## 2012-09-12 DIAGNOSIS — G8929 Other chronic pain: Secondary | ICD-10-CM | POA: Insufficient documentation

## 2012-09-12 DIAGNOSIS — Z794 Long term (current) use of insulin: Secondary | ICD-10-CM | POA: Insufficient documentation

## 2012-09-12 DIAGNOSIS — Z79899 Other long term (current) drug therapy: Secondary | ICD-10-CM | POA: Insufficient documentation

## 2012-09-12 DIAGNOSIS — Z8739 Personal history of other diseases of the musculoskeletal system and connective tissue: Secondary | ICD-10-CM | POA: Insufficient documentation

## 2012-09-12 DIAGNOSIS — F411 Generalized anxiety disorder: Secondary | ICD-10-CM | POA: Insufficient documentation

## 2012-09-12 DIAGNOSIS — F17200 Nicotine dependence, unspecified, uncomplicated: Secondary | ICD-10-CM | POA: Insufficient documentation

## 2012-09-12 LAB — CBC WITH DIFFERENTIAL/PLATELET
Basophils Absolute: 0.1 10*3/uL (ref 0.0–0.1)
Basophils Relative: 1 % (ref 0–1)
Eosinophils Absolute: 0.5 10*3/uL (ref 0.0–0.7)
Eosinophils Relative: 6 % — ABNORMAL HIGH (ref 0–5)
HCT: 43.9 % (ref 36.0–46.0)
Hemoglobin: 15.7 g/dL — ABNORMAL HIGH (ref 12.0–15.0)
Lymphocytes Relative: 30 % (ref 12–46)
Lymphs Abs: 2.5 10*3/uL (ref 0.7–4.0)
MCH: 31.5 pg (ref 26.0–34.0)
MCHC: 35.8 g/dL (ref 30.0–36.0)
MCV: 88.2 fL (ref 78.0–100.0)
Monocytes Absolute: 0.3 10*3/uL (ref 0.1–1.0)
Monocytes Relative: 3 % (ref 3–12)
Neutro Abs: 5.2 10*3/uL (ref 1.7–7.7)
Neutrophils Relative %: 61 % (ref 43–77)
Platelets: 155 10*3/uL (ref 150–400)
RBC: 4.98 MIL/uL (ref 3.87–5.11)
RDW: 13.6 % (ref 11.5–15.5)
WBC: 8.4 10*3/uL (ref 4.0–10.5)

## 2012-09-12 LAB — COMPREHENSIVE METABOLIC PANEL
ALT: 37 U/L — ABNORMAL HIGH (ref 0–35)
AST: 42 U/L — ABNORMAL HIGH (ref 0–37)
Albumin: 4.4 g/dL (ref 3.5–5.2)
Alkaline Phosphatase: 68 U/L (ref 39–117)
BUN: 13 mg/dL (ref 6–23)
CO2: 24 mEq/L (ref 19–32)
Calcium: 10.1 mg/dL (ref 8.4–10.5)
Chloride: 102 mEq/L (ref 96–112)
Creatinine, Ser: 0.84 mg/dL (ref 0.50–1.10)
GFR calc Af Amer: 88 mL/min — ABNORMAL LOW (ref >90)
GFR calc non Af Amer: 76 mL/min — ABNORMAL LOW (ref >90)
Glucose, Bld: 109 mg/dL — ABNORMAL HIGH (ref 70–99)
Potassium: 3.9 mEq/L (ref 3.5–5.1)
Sodium: 139 mEq/L (ref 135–145)
Total Bilirubin: 0.3 mg/dL (ref 0.3–1.2)
Total Protein: 8.4 g/dL — ABNORMAL HIGH (ref 6.0–8.3)

## 2012-09-12 LAB — RAPID URINE DRUG SCREEN, HOSP PERFORMED
Amphetamines: NOT DETECTED
Barbiturates: NOT DETECTED
Benzodiazepines: POSITIVE — AB
Cocaine: NOT DETECTED
Opiates: NOT DETECTED
Tetrahydrocannabinol: NOT DETECTED

## 2012-09-12 LAB — URINALYSIS, ROUTINE W REFLEX MICROSCOPIC
Bilirubin Urine: NEGATIVE
Glucose, UA: NEGATIVE mg/dL
Hgb urine dipstick: NEGATIVE
Ketones, ur: NEGATIVE mg/dL
Leukocytes, UA: NEGATIVE
Nitrite: NEGATIVE
Protein, ur: NEGATIVE mg/dL
Specific Gravity, Urine: 1.008 (ref 1.005–1.030)
Urobilinogen, UA: 0.2 mg/dL (ref 0.0–1.0)
pH: 5.5 (ref 5.0–8.0)

## 2012-09-12 LAB — ETHANOL: Alcohol, Ethyl (B): 156 mg/dL — ABNORMAL HIGH (ref 0–11)

## 2012-09-12 LAB — AMMONIA: Ammonia: 18 umol/L (ref 11–60)

## 2012-09-12 MED ORDER — HYDROMORPHONE HCL PF 1 MG/ML IJ SOLN
1.0000 mg | Freq: Once | INTRAMUSCULAR | Status: AC
  Administered 2012-09-12: 1 mg via INTRAVENOUS
  Filled 2012-09-12: qty 1

## 2012-09-12 MED ORDER — OXYCODONE HCL 15 MG PO TABS: 15.0000 mg | ORAL_TABLET | Freq: Three times a day (TID) | ORAL | Status: DC | PRN

## 2012-09-12 NOTE — ED Notes (Addendum)
Pt presents with right flank pain, frequent urination for 3 months She reports the right sided flank pain is worse with movement.. She has been going to a pain clinic for 4 years and taking oxycodone for chronic pain. Speech is slurred, pt is tearful at times.  She is requesting an MRI from neck down

## 2012-09-12 NOTE — ED Notes (Signed)
Pt states "I have been taking aspirin by the handfuls". States she lost her pain medicine. Hx of liver failure. Speech is slurred.

## 2012-09-12 NOTE — ED Notes (Signed)
Pt moved to POD A-8. Attempted IV x1 without success.

## 2012-09-12 NOTE — ED Provider Notes (Signed)
Medical screening examination/treatment/procedure(s) were performed by non-physician practitioner and as supervising physician I was immediately available for consultation/collaboration.   Cieara Stierwalt, MD 09/12/12 2317 

## 2012-09-12 NOTE — ED Notes (Signed)
Charge nurse notified

## 2012-09-12 NOTE — ED Provider Notes (Signed)
History     CSN: [id number]  Arrival date & time 09/12/12  1727   First MD Initiated Contact with Patient 09/12/12 1758      Chief Complaint  Patient presents with  . Flank Pain    (Consider location/radiation/quality/duration/timing/severity/associated sxs/prior treatment) HPI Patient presents to the emergency department with chronic pain.  Patient, states, that she's been hurting all over and had a mishap with her pain medications.  Patient, states, that she spilled some and then a 2 L Coke spilled on him as well.  Patient's husband also provide some history the patient stopped taking her lactulose several weeks and she was having some hallucinations at the time.  Patient sees pain management, states, that she will have to comment on April 28 for her regular appointment to get more medications.  Patient denies chest pain, shortness of breath, nausea, vomiting, abdominal pain, fever, cough, dizziness, or syncope.  Patient admits to taking several shots of RUM prior to arrival to help with her pain. Past Medical History  Diagnosis Date  . Diabetes mellitus   . Hypertension   . Anxiety   . Chronic pain   . Fibromyalgia     Past Surgical History  Procedure Laterality Date  . Rotator cuff repair      History reviewed. No pertinent family history.  History  Substance Use Topics  . Smoking status: Current Every Day Smoker  . Smokeless tobacco: Not on file  . Alcohol Use: No    OB History   Grav Para Term Preterm Abortions TAB SAB Ect Mult Living                  Review of Systems All other systems negative except as documented in the HPI. All pertinent positives and negatives as reviewed in the HPI. Allergies  Penicillins  Home Medications   Current Outpatient Rx  Name  Route  Sig  Dispense  Refill  . ALPRAZolam (XANAX) 1 MG tablet   Oral   Take 0.5-1 mg by mouth 3 (three) times daily as needed for anxiety.         . furosemide (LASIX) 40 MG tablet   Oral  Take 40 mg by mouth daily.         . insulin glargine (LANTUS) 100 UNIT/ML injection   Subcutaneous   Inject 60 Units into the skin at bedtime.         Marland Kitchen lactulose (CHRONULAC) 10 GM/15ML solution   Oral   Take 30 g by mouth 3 (three) times daily.         Marland Kitchen omeprazole (PRILOSEC) 20 MG capsule   Oral   Take 20 mg by mouth daily.         Marland Kitchen oxyCODONE (ROXICODONE) 15 MG immediate release tablet   Oral   Take 15 mg by mouth every 6 (six) hours as needed for pain.         . rifaximin (XIFAXAN) 550 MG TABS   Oral   Take 550 mg by mouth 2 (two) times daily.         Marland Kitchen spironolactone (ALDACTONE) 100 MG tablet   Oral   Take 100 mg by mouth daily.           BP 128/62  Pulse 86  Temp(Src) 98.5 F (36.9 C) (Oral)  Resp 16  SpO2 98%  Physical Exam  Nursing note and vitals reviewed. Constitutional: She is oriented to person, place, and time. She appears well-developed and well-nourished. No  distress.  HENT:  Head: Normocephalic and atraumatic.  Mouth/Throat: Oropharynx is clear and moist.  Eyes: Pupils are equal, round, and reactive to light.  Neck: Normal range of motion. Neck supple.  Cardiovascular: Normal rate, regular rhythm and normal heart sounds.  Exam reveals no gallop and no friction rub.   No murmur heard. Pulmonary/Chest: Effort normal and breath sounds normal. No respiratory distress.  Abdominal: Soft. Bowel sounds are normal. There is no tenderness.  Neurological: She is alert and oriented to person, place, and time. She exhibits normal muscle tone. Coordination normal.  Skin: Skin is warm and dry. No rash noted.    ED Course  Procedures (including critical care time)  Labs Reviewed  CBC WITH DIFFERENTIAL - Abnormal; Notable for the following:    Hemoglobin 15.7 (*)    Eosinophils Relative 6 (*)    All other components within normal limits  COMPREHENSIVE METABOLIC PANEL - Abnormal; Notable for the following:    Glucose, Bld 109 (*)    Total  Protein 8.4 (*)    AST 42 (*)    ALT 37 (*)    GFR calc non Af Amer 76 (*)    GFR calc Af Amer 88 (*)    All other components within normal limits  URINE RAPID DRUG SCREEN (HOSP PERFORMED) - Abnormal; Notable for the following:    Benzodiazepines POSITIVE (*)    All other components within normal limits  ETHANOL - Abnormal; Notable for the following:    Alcohol, Ethyl (B) 156 (*)    All other components within normal limits  URINALYSIS, ROUTINE W REFLEX MICROSCOPIC  AMMONIA   Patient has had some alcohol tonight.  She is not hallucinating or have any alterations in mentation.  Husband states, that she is in her normal state when she is taking her lactulose.  Patient, states, that she will go home and follow up with her primary care Dr. and pain management specialist for her pain.  Patient is advised to return to the emergency department as needed.   MDM  MDM Reviewed: vitals, nursing note and previous chart Reviewed previous: labs Interpretation: labs          Carlyle Dolly, PA-C 09/12/12 2059

## 2012-09-17 ENCOUNTER — Emergency Department (HOSPITAL_COMMUNITY)
Admission: EM | Admit: 2012-09-17 | Discharge: 2012-09-17 | Disposition: A | Discharge status: Home / Self Care | Payer: Managed Care, Other (non HMO) | Attending: Emergency Medicine | Admitting: Emergency Medicine

## 2012-09-17 ENCOUNTER — Encounter (HOSPITAL_COMMUNITY): Payer: Self-pay | Admitting: Emergency Medicine

## 2012-09-17 DIAGNOSIS — IMO0001 Reserved for inherently not codable concepts without codable children: Secondary | ICD-10-CM | POA: Insufficient documentation

## 2012-09-17 DIAGNOSIS — Z79899 Other long term (current) drug therapy: Secondary | ICD-10-CM | POA: Insufficient documentation

## 2012-09-17 DIAGNOSIS — M797 Fibromyalgia: Secondary | ICD-10-CM

## 2012-09-17 DIAGNOSIS — F17200 Nicotine dependence, unspecified, uncomplicated: Secondary | ICD-10-CM | POA: Insufficient documentation

## 2012-09-17 DIAGNOSIS — G8929 Other chronic pain: Secondary | ICD-10-CM | POA: Insufficient documentation

## 2012-09-17 DIAGNOSIS — F411 Generalized anxiety disorder: Secondary | ICD-10-CM | POA: Insufficient documentation

## 2012-09-17 DIAGNOSIS — F419 Anxiety disorder, unspecified: Secondary | ICD-10-CM

## 2012-09-17 DIAGNOSIS — I10 Essential (primary) hypertension: Secondary | ICD-10-CM | POA: Insufficient documentation

## 2012-09-17 DIAGNOSIS — M542 Cervicalgia: Secondary | ICD-10-CM | POA: Insufficient documentation

## 2012-09-17 DIAGNOSIS — E119 Type 2 diabetes mellitus without complications: Secondary | ICD-10-CM | POA: Insufficient documentation

## 2012-09-17 DIAGNOSIS — M549 Dorsalgia, unspecified: Secondary | ICD-10-CM | POA: Insufficient documentation

## 2012-09-17 DIAGNOSIS — Z794 Long term (current) use of insulin: Secondary | ICD-10-CM | POA: Insufficient documentation

## 2012-09-17 MED ORDER — OXYCODONE-ACETAMINOPHEN 5-325 MG PO TABS
1.0000 | ORAL_TABLET | Freq: Once | ORAL | Status: AC
Start: 2012-09-17 — End: 2012-09-17
  Administered 2012-09-17: 1 via ORAL
  Filled 2012-09-17: qty 1

## 2012-09-17 NOTE — ED Provider Notes (Signed)
History     CSN: [id number]  Arrival date & time 09/17/12  4098   First MD Initiated Contact with Patient 09/17/12 1158      Chief Complaint  Patient presents with  . Medication Refill    (Consider location/radiation/quality/duration/timing/severity/associated sxs/prior treatment) HPI Comments: Katelyn Stevenson is a 57 y/o F with PMHx of DM, HTN, anxiety, Fibromyalgia presenting to the ED with exacerbation of back and neck pain. Patient reported that when she woke up this morning the pain was unbearable. Described pain in neck and back as a constant, shooting pain that just did not seem to alleviate at all. Patient reported that she has had ongoing back and neck pain for the past 20 years - has been following up with pain management physician, Dr. Wyline Beady, for the past 3 years and has been controlled. Patient reported pain started today because she lost her pain medications on Saturday - stated that they fell on the floor and does not know where they are. Patient reported that she has an appointment with pain management specialist today (09/17/2012) at 1:30PM - stated that she could not wait, husband was not home, so called EMS to be brought in so pain can be relieved. Patient reported that she has been under a lot of stress and has had panic attacks brought on by the stress - stressed due to mother being recently diagnosed with stage IV lung cancer and uncle recently passing away 2 weeks ago. Denied chest pain, shortness of breathe, difficulty breathing, gi symptoms, urinary symptoms, visual distortions, confusion, amnesia, forgetfulness, headache, dizziness, leg swelling, numbness, tingling.    Reviewed patient's chart - patient was recently seen in ED 09/12/2012 for similar reasons.  Pain management physician: Dr. Wyline Beady PCP: Dr. Doristine Counter  The history is provided by the patient and the spouse. No language interpreter was used.    Past Medical History  Diagnosis Date  . Diabetes  mellitus   . Hypertension   . Anxiety   . Chronic pain   . Fibromyalgia     Past Surgical History  Procedure Laterality Date  . Rotator cuff repair      History reviewed. No pertinent family history.  History  Substance Use Topics  . Smoking status: Current Every Day Smoker  . Smokeless tobacco: Not on file  . Alcohol Use: Yes    OB History   Grav Para Term Preterm Abortions TAB SAB Ect Mult Living                  Review of Systems  Constitutional: Negative for fever and chills.  HENT: Positive for neck pain. Negative for ear pain, sore throat, trouble swallowing, neck stiffness and tinnitus.        Chronic neck pain  Eyes: Negative for pain and visual disturbance.  Respiratory: Negative for cough, chest tightness and shortness of breath.   Cardiovascular: Negative for chest pain.  Gastrointestinal: Negative for nausea, vomiting, abdominal pain, diarrhea and constipation.  Genitourinary: Negative for hematuria, decreased urine volume and difficulty urinating.  Musculoskeletal: Positive for back pain. Negative for myalgias and joint swelling.       Chronic back pain  Skin: Negative for rash.  Neurological: Negative for dizziness, weakness, light-headedness, numbness and headaches.  All other systems reviewed and are negative.    Allergies  Penicillins  Home Medications   Current Outpatient Rx  Name  Route  Sig  Dispense  Refill  . ALPRAZolam (XANAX) 1 MG tablet   Oral  Take 1 mg by mouth 3 (three) times daily as needed for anxiety.          . furosemide (LASIX) 40 MG tablet   Oral   Take 40 mg by mouth daily.         . insulin glargine (LANTUS) 100 UNIT/ML injection   Subcutaneous   Inject 60 Units into the skin at bedtime.         Marland Kitchen lactulose (CHRONULAC) 10 GM/15ML solution   Oral   Take 30 g by mouth 3 (three) times daily.         Marland Kitchen omeprazole (PRILOSEC) 20 MG capsule   Oral   Take 20 mg by mouth daily.         Marland Kitchen oxyCODONE  (ROXICODONE) 15 MG immediate release tablet   Oral   Take 15 mg by mouth 4 (four) times daily.         . rifaximin (XIFAXAN) 550 MG TABS   Oral   Take 550 mg by mouth 2 (two) times daily.         Marland Kitchen spironolactone (ALDACTONE) 100 MG tablet   Oral   Take 100 mg by mouth daily.           BP 115/74  Pulse 78  Temp(Src) 98 F (36.7 C) (Oral)  Resp 16  SpO2 96%  Physical Exam  Nursing note and vitals reviewed. Constitutional: She is oriented to person, place, and time. She appears well-developed and well-nourished. No distress.  HENT:  Head: Normocephalic and atraumatic.  Mouth/Throat: Oropharynx is clear and moist. No oropharyngeal exudate.  Uvula midline, symmetrical elevation  Eyes: Conjunctivae and EOM are normal. Pupils are equal, round, and reactive to light. Right eye exhibits no discharge. Left eye exhibits no discharge.  Neck: Normal range of motion. Neck supple. No tracheal deviation present.  Negative lymphadenopathy Mild discomfort upon palpation of neck mid-spinal when neck flexed.  Cardiovascular: Normal rate, regular rhythm and normal heart sounds.  Exam reveals no friction rub.   No murmur heard. Radial pulses 2+ bilaterally Negative ankle and leg swelling Negative pitting edema noted  Pulmonary/Chest: Effort normal and breath sounds normal. No respiratory distress. She has no wheezes. She has no rales.  Abdominal: Soft. Bowel sounds are normal. She exhibits no distension and no mass. There is no tenderness. There is no rebound and no guarding.  Musculoskeletal: She exhibits no edema and no tenderness.  Decreased ROM to lower extremities due to referred chronic back pain  Lymphadenopathy:    She has no cervical adenopathy.  Neurological: She is alert and oriented to person, place, and time. No cranial nerve deficit. She exhibits normal muscle tone. Coordination normal.  Skin: Skin is warm and dry. No rash noted. She is not diaphoretic. No erythema.   Psychiatric: She has a normal mood and affect. Her behavior is normal. Thought content normal.    ED Course  Procedures (including critical care time)  Labs Reviewed - No data to display No results found.   1. Chronic neck pain   2. Chronic back pain   3. Fibromyalgia   4. DM (diabetes mellitus)   5. HTN (hypertension)   6. Anxiety       MDM  I personally evaluated and examined the patient. Appeared calm and cooperative. Patient afebrile, normotensive, non-tachycardic, alert and oriented. No neurovascular damage noted. Ongoing chronic neck and back pain that patient is already being treated for as an outpatient. Pain medication given in ED that calmed  the pain down moderately. Patient aseptic, non-toxic appearing, in no acute distress. Discharged patient. Discussed with patient to keep appointment for this afternoon with pain specialist. Discharged patient with no mediation since she is seeing her pain specialist this afternoon. Discussed with patient to follow-up with PCP, will need to monitor ammonia levels. Discussed with patient to continue taking home medications as prescribed, monitor glucose. Referred patient to orthopedics for possible beginnings of physical therapy to aid in muscle strengthening techniques. Stay hydrated. Discussed with patient to monitor symptoms and if symptoms are to worsen or change to report back to the ED. Patient agreed to plan of care, understood, all questions answered.         Raymon Mutton, PA-C 09/17/12 1936

## 2012-09-17 NOTE — ED Notes (Signed)
Pt here today requesting pain meds. Here 4/23 for same. Has an appointment with Dr.(?) at 1330 today "but I just couldn't wait".

## 2012-09-17 NOTE — ED Notes (Addendum)
Per EMS: pt from home c/o chronic back and knee pain and sts out of pain meds; pt here requesting refill; pt denies new injury

## 2012-09-17 NOTE — ED Notes (Signed)
PA at bedside.  Pt states she is feeling better than she did on arrival.

## 2012-09-18 NOTE — ED Provider Notes (Signed)
Medical screening examination/treatment/procedure(s) were performed by non-physician practitioner and as supervising physician I was immediately available for consultation/collaboration.   Alaynna Kerwood, MD 09/18/12 1744 

## 2013-03-04 ENCOUNTER — Emergency Department (HOSPITAL_COMMUNITY): Payer: BC Managed Care – PPO

## 2013-03-04 ENCOUNTER — Emergency Department (HOSPITAL_COMMUNITY)
Admission: EM | Admit: 2013-03-04 | Discharge: 2013-03-04 | Disposition: A | Discharge status: Home / Self Care | Payer: BC Managed Care – PPO | Attending: Emergency Medicine | Admitting: Emergency Medicine

## 2013-03-04 ENCOUNTER — Encounter (HOSPITAL_COMMUNITY): Payer: Self-pay | Admitting: Emergency Medicine

## 2013-03-04 DIAGNOSIS — R296 Repeated falls: Secondary | ICD-10-CM | POA: Insufficient documentation

## 2013-03-04 DIAGNOSIS — J329 Chronic sinusitis, unspecified: Secondary | ICD-10-CM

## 2013-03-04 DIAGNOSIS — Z79899 Other long term (current) drug therapy: Secondary | ICD-10-CM | POA: Insufficient documentation

## 2013-03-04 DIAGNOSIS — R4182 Altered mental status, unspecified: Secondary | ICD-10-CM | POA: Insufficient documentation

## 2013-03-04 DIAGNOSIS — Y929 Unspecified place or not applicable: Secondary | ICD-10-CM | POA: Insufficient documentation

## 2013-03-04 DIAGNOSIS — W19XXXA Unspecified fall, initial encounter: Secondary | ICD-10-CM

## 2013-03-04 DIAGNOSIS — IMO0001 Reserved for inherently not codable concepts without codable children: Secondary | ICD-10-CM | POA: Insufficient documentation

## 2013-03-04 DIAGNOSIS — G8929 Other chronic pain: Secondary | ICD-10-CM | POA: Insufficient documentation

## 2013-03-04 DIAGNOSIS — R5381 Other malaise: Secondary | ICD-10-CM | POA: Insufficient documentation

## 2013-03-04 DIAGNOSIS — R531 Weakness: Secondary | ICD-10-CM

## 2013-03-04 DIAGNOSIS — I10 Essential (primary) hypertension: Secondary | ICD-10-CM | POA: Insufficient documentation

## 2013-03-04 DIAGNOSIS — S79919A Unspecified injury of unspecified hip, initial encounter: Secondary | ICD-10-CM | POA: Insufficient documentation

## 2013-03-04 DIAGNOSIS — Y9389 Activity, other specified: Secondary | ICD-10-CM | POA: Insufficient documentation

## 2013-03-04 DIAGNOSIS — Z794 Long term (current) use of insulin: Secondary | ICD-10-CM | POA: Insufficient documentation

## 2013-03-04 DIAGNOSIS — F17200 Nicotine dependence, unspecified, uncomplicated: Secondary | ICD-10-CM | POA: Insufficient documentation

## 2013-03-04 DIAGNOSIS — E119 Type 2 diabetes mellitus without complications: Secondary | ICD-10-CM | POA: Insufficient documentation

## 2013-03-04 DIAGNOSIS — F411 Generalized anxiety disorder: Secondary | ICD-10-CM | POA: Insufficient documentation

## 2013-03-04 DIAGNOSIS — Z880 Allergy status to penicillin: Secondary | ICD-10-CM | POA: Insufficient documentation

## 2013-03-04 LAB — COMPREHENSIVE METABOLIC PANEL
ALT: 77 U/L — ABNORMAL HIGH (ref 0–35)
AST: 244 U/L — ABNORMAL HIGH (ref 0–37)
Albumin: 3.6 g/dL (ref 3.5–5.2)
Alkaline Phosphatase: 71 U/L (ref 39–117)
BUN: 40 mg/dL — ABNORMAL HIGH (ref 6–23)
CO2: 24 mEq/L (ref 19–32)
Calcium: 9.2 mg/dL (ref 8.4–10.5)
Chloride: 107 mEq/L (ref 96–112)
Creatinine, Ser: 0.88 mg/dL (ref 0.50–1.10)
GFR calc Af Amer: 83 mL/min — ABNORMAL LOW (ref >90)
GFR calc non Af Amer: 72 mL/min — ABNORMAL LOW (ref >90)
Glucose, Bld: 154 mg/dL — ABNORMAL HIGH (ref 70–99)
Potassium: 3.7 mEq/L (ref 3.5–5.1)
Sodium: 140 mEq/L (ref 135–145)
Total Bilirubin: 1.4 mg/dL — ABNORMAL HIGH (ref 0.3–1.2)
Total Protein: 7.2 g/dL (ref 6.0–8.3)

## 2013-03-04 LAB — CBC WITH DIFFERENTIAL/PLATELET
Basophils Absolute: 0.1 10*3/uL (ref 0.0–0.1)
Basophils Relative: 0 % (ref 0–1)
Eosinophils Absolute: 0.2 10*3/uL (ref 0.0–0.7)
Eosinophils Relative: 1 % (ref 0–5)
HCT: 42.2 % (ref 36.0–46.0)
Hemoglobin: 14.4 g/dL (ref 12.0–15.0)
Lymphocytes Relative: 18 % (ref 12–46)
Lymphs Abs: 2.2 10*3/uL (ref 0.7–4.0)
MCH: 31.6 pg (ref 26.0–34.0)
MCHC: 34.1 g/dL (ref 30.0–36.0)
MCV: 92.5 fL (ref 78.0–100.0)
Monocytes Absolute: 1 10*3/uL (ref 0.1–1.0)
Monocytes Relative: 9 % (ref 3–12)
Neutro Abs: 8.4 10*3/uL — ABNORMAL HIGH (ref 1.7–7.7)
Neutrophils Relative %: 71 % (ref 43–77)
Platelets: 121 10*3/uL — ABNORMAL LOW (ref 150–400)
RBC: 4.56 MIL/uL (ref 3.87–5.11)
RDW: 13.2 % (ref 11.5–15.5)
WBC: 11.8 10*3/uL — ABNORMAL HIGH (ref 4.0–10.5)

## 2013-03-04 LAB — AMMONIA: Ammonia: 19 umol/L (ref 11–60)

## 2013-03-04 LAB — URINE MICROSCOPIC-ADD ON

## 2013-03-04 LAB — URINALYSIS, ROUTINE W REFLEX MICROSCOPIC
Glucose, UA: NEGATIVE mg/dL
Hgb urine dipstick: NEGATIVE
Ketones, ur: NEGATIVE mg/dL
Nitrite: NEGATIVE
Protein, ur: NEGATIVE mg/dL
Specific Gravity, Urine: 1.031 — ABNORMAL HIGH (ref 1.005–1.030)
Urobilinogen, UA: 1 mg/dL (ref 0.0–1.0)
pH: 5.5 (ref 5.0–8.0)

## 2013-03-04 LAB — RAPID URINE DRUG SCREEN, HOSP PERFORMED
Amphetamines: NOT DETECTED
Barbiturates: NOT DETECTED
Benzodiazepines: POSITIVE — AB
Cocaine: NOT DETECTED
Opiates: NOT DETECTED
Tetrahydrocannabinol: NOT DETECTED

## 2013-03-04 LAB — TYPE AND SCREEN
ABO/RH(D): A POS
Antibody Screen: NEGATIVE

## 2013-03-04 LAB — ETHANOL: Alcohol, Ethyl (B): <11 mg/dL (ref 0–11)

## 2013-03-04 LAB — PROTIME-INR
INR: 1.31 (ref 0.00–1.49)
Prothrombin Time: 16 seconds — ABNORMAL HIGH (ref 11.6–15.2)

## 2013-03-04 LAB — ABO/RH: ABO/RH(D): A POS

## 2013-03-04 MED ORDER — SODIUM CHLORIDE 0.9 % IV SOLN
1000.0000 mL | INTRAVENOUS | Status: DC
  Administered 2013-03-04: 1000 mL via INTRAVENOUS

## 2013-03-04 MED ORDER — SODIUM CHLORIDE 0.9 % IV SOLN
1000.0000 mL | Freq: Once | INTRAVENOUS | Status: AC
  Administered 2013-03-04: 1000 mL via INTRAVENOUS

## 2013-03-04 NOTE — ED Provider Notes (Signed)
CSN: [id number]     Arrival date & time 03/04/13  1128 History   First MD Initiated Contact with Patient 03/04/13 1132     Chief Complaint  Patient presents with  . Fall  . Hip Pain  . Altered Mental Status    HPI Patient has a history of diabetes, chronic pain, fibromyalgia.  She was on her on this weekend as her husband was fishing at the toes and when he returned he found her on the floor.  He states that she seemed more weak and more confused than baseline.  She has a history of elevated ammonia before in the past and takes lactulose.  He states he sometimes acts like this when her ammonia level is elevated.  She also is on chronic pain medicine including oxycodone and takes Xanax daily.  He states he is having difficulty finding her medications at home.  He stated he was able to get her in the bed last night and then she fell out of bed again.  She denies headache.  Denies chest pain or shortness of breath.  She denies abdominal pain.  No reported nausea vomiting or diarrhea.  No fevers or chills.  Patient does report generalized pain in both hips.   Past Medical History  Diagnosis Date  . Diabetes mellitus   . Hypertension   . Anxiety   . Chronic pain   . Fibromyalgia    Past Surgical History  Procedure Laterality Date  . Rotator cuff repair     No family history on file. History  Substance Use Topics  . Smoking status: Current Every Day Smoker  . Smokeless tobacco: Not on file  . Alcohol Use: Yes   OB History   Grav Para Term Preterm Abortions TAB SAB Ect Mult Living                 Review of Systems  All other systems reviewed and are negative.    Allergies  Penicillins and Codeine  Home Medications   Current Outpatient Rx  Name  Route  Sig  Dispense  Refill  . ALPRAZolam (XANAX) 1 MG tablet   Oral   Take 1 mg by mouth 3 (three) times daily as needed for anxiety.          . diphenhydrAMINE (BENADRYL) 25 MG tablet   Oral   Take 25 mg by mouth every 6  (six) hours as needed for itching.         . furosemide (LASIX) 40 MG tablet   Oral   Take 40 mg by mouth daily.         . insulin glargine (LANTUS) 100 UNIT/ML injection   Subcutaneous   Inject 55 Units into the skin at bedtime.          Marland Kitchen lactulose (CHRONULAC) 10 GM/15ML solution   Oral   Take 30 g by mouth 3 (three) times daily.         Marland Kitchen omeprazole (PRILOSEC) 20 MG capsule   Oral   Take 20 mg by mouth daily.         Marland Kitchen oxyCODONE (ROXICODONE) 15 MG immediate release tablet   Oral   Take 15 mg by mouth 4 (four) times daily.         . rifaximin (XIFAXAN) 550 MG TABS   Oral   Take 550 mg by mouth 2 (two) times daily.         Marland Kitchen spironolactone (ALDACTONE) 100 MG tablet  Oral   Take 100 mg by mouth daily.         . Thiamine HCl (B-1 PO)   Oral   Take 1 tablet by mouth daily.          BP 110/66  Pulse 86  Temp(Src) 98.9 F (37.2 C) (Rectal)  Resp 18  SpO2 100% Physical Exam  Nursing note and vitals reviewed. Constitutional: She is oriented to person, place, and time. She appears well-developed and well-nourished. No distress.  HENT:  Head: Normocephalic and atraumatic.  Eyes: EOM are normal.  Neck: Normal range of motion.  Cardiovascular: Normal rate, regular rhythm and normal heart sounds.   Pulmonary/Chest: Effort normal and breath sounds normal.  Abdominal: Soft. She exhibits no distension. There is no tenderness.  Musculoskeletal: Normal range of motion.  Range of motion bilateral hips knees and ankles.  Normal pulses in bilateral feet.  Neurological: She is alert and oriented to person, place, and time.  Skin: Skin is warm and dry.  Psychiatric: She has a normal mood and affect. Judgment normal.    ED Course  Procedures (including critical care time) Labs Review Labs Reviewed  CBC WITH DIFFERENTIAL - Abnormal; Notable for the following:    WBC 11.8 (*)    Platelets 121 (*)    Neutro Abs 8.4 (*)    All other components within normal  limits  COMPREHENSIVE METABOLIC PANEL - Abnormal; Notable for the following:    Glucose, Bld 154 (*)    BUN 40 (*)    AST 244 (*)    ALT 77 (*)    Total Bilirubin 1.4 (*)    GFR calc non Af Amer 72 (*)    GFR calc Af Amer 83 (*)    All other components within normal limits  PROTIME-INR - Abnormal; Notable for the following:    Prothrombin Time 16.0 (*)    All other components within normal limits  URINALYSIS, ROUTINE W REFLEX MICROSCOPIC - Abnormal; Notable for the following:    Color, Urine AMBER (*)    Specific Gravity, Urine 1.031 (*)    Bilirubin Urine SMALL (*)    Leukocytes, UA TRACE (*)    All other components within normal limits  URINE RAPID DRUG SCREEN (HOSP PERFORMED) - Abnormal; Notable for the following:    Benzodiazepines POSITIVE (*)    All other components within normal limits  URINE MICROSCOPIC-ADD ON - Abnormal; Notable for the following:    Bacteria, UA FEW (*)    All other components within normal limits  AMMONIA  ETHANOL  TYPE AND SCREEN  ABO/RH   Imaging Review Dg Chest 1 View  03/04/2013   CLINICAL DATA:  Larey Seat.  EXAM: CHEST - 1 VIEW  COMPARISON:  10/24/2009.  FINDINGS: Decreased inspiration with apparent borderline enlargement of the cardiac silhouette. Stable prominence of the interstitial markings. Thoracic spine degenerative changes. No fracture or pneumothorax seen.  IMPRESSION: Stable chronic interstitial lung disease. No acute abnormality.   Electronically Signed   By: Gordan Payment M.D.   On: 03/04/2013 12:41   Dg Pelvis 1-2 Views  03/04/2013   CLINICAL DATA:  Left pelvic pain following a fall.  EXAM: PELVIS - 1-2 VIEW  COMPARISON:  Pelvis CT dated 09/10/2012.  FINDINGS: Mild lower lumbar spine degenerative changes. No fracture or dislocation. No soft tissue abnormality.  IMPRESSION: No fracture or dislocation. Mild lower lumbar spine degenerative changes.   Electronically Signed   By: Gordan Payment M.D.   On: 03/04/2013 12:40  Dg Hip Complete  Left  03/04/2013   CLINICAL DATA:  Left hip pain post fall  EXAM: LEFT HIP - COMPLETE 2+ VIEW  COMPARISON:  None  FINDINGS: Left hip joint space preserved.  Visualized left SI joint preserved.  Osseous mineralization normal.  No acute fracture, dislocation or bone destruction.  IMPRESSION: No acute osseous abnormalities.   Electronically Signed   By: Ulyses Southward M.D.   On: 03/04/2013 12:36   Ct Head Wo Contrast  03/04/2013   CLINICAL DATA:  Fall multiple times.  EXAM: CT HEAD WITHOUT CONTRAST  TECHNIQUE: Contiguous axial images were obtained from the base of the skull through the vertex without intravenous contrast.  COMPARISON:  None.  FINDINGS: No acute intracranial abnormality. Specifically, no hemorrhage, hydrocephalus, mass lesion, acute infarction, or significant intracranial injury. No acute calvarial abnormality.  Rounded mucosal thickening in the posterior aspect of the right maxillary sinus, likely mucous retention cyst. Mastoid air cells are clear.  IMPRESSION: No intracranial abnormality.   Electronically Signed   By: Charlett Nose M.D.   On: 03/04/2013 12:37   Ct Cervical Spine Wo Contrast  03/04/2013   CLINICAL DATA:  Fall.  EXAM: CT CERVICAL SPINE WITHOUT CONTRAST  TECHNIQUE: Multidetector CT imaging of the cervical spine was performed without intravenous contrast. Multiplanar CT image reconstructions were also generated.  COMPARISON:  None.  FINDINGS: Normal alignment. No subluxation. No fracture. Prevertebral soft tissues are normal. Early degenerative disc changes in the mid early degenerative disc disease changes. No epidural or paraspinal hematoma.  IMPRESSION: No acute bony abnormality.   Electronically Signed   By: Charlett Nose M.D.   On: 03/04/2013 12:39   Ct Maxillofacial Wo Cm  03/04/2013   CLINICAL DATA:  57 year old female with fall.  Chronic sinusitis.  EXAM: CT MAXILLOFACIAL WITHOUT CONTRAST  TECHNIQUE: Multidetector CT imaging of the maxillofacial structures was performed.  Multiplanar CT image reconstructions were also generated. A small metallic BB was placed on the right temple in order to reliably differentiate right from left.  COMPARISON:  Head and C-spine CT from the same day reported separately.  FINDINGS: Negative visualized non contrast brain parenchyma.  Dysconjugate gaze, otherwise negative orbits soft tissues.  Visualized deep soft tissue spaces of the face and neck are within normal limits; probable dystrophic calcification of the left tonsillar pillar.  Tympanic cavities and mastoids are clear. Mild calcified atherosclerosis at the skull base.  Small right maxillary sinus mucous retention cyst. Otherwise negative paranasal sinuses.  Mandible intact. Negative dentition. Nasal bones intact. Bilateral orbital walls intact. No acute facial fracture identified.  IMPRESSION: No acute facial findings identified. Small right maxillary sinus mucous retention cyst.   Electronically Signed   By: Augusto Gamble M.D.   On: 03/04/2013 13:09    EKG Interpretation   None       MDM    4:17 PM Labs and imaging demonstrated no significant abnormality.  It appears that a CT maxillofacial was ordered by her primary care physician while the patient was in the emergency department.  Her ammonia level is not abnormal.  I suspect this may be polypharmacy with benzodiazepines and narcotic pain medications.  I last case management to evaluate the patient and recommend additional outpatient resources including home health and physical therapy.  Patient has had some sort of a gradual decline over the past several weeks to months.  There is no indication for admission to the hospital this time.    Lyanne Co, MD 03/04/13 [phone]

## 2013-03-04 NOTE — Progress Notes (Signed)
CARE MANAGEMENT ED NOTE 03/04/2013  Patient:  Katelyn Stevenson, Katelyn Stevenson   Account Number:  [id number]  Date Initiated:  03/04/2013  Documentation initiated by:  Edd Arbour  Subjective/Objective Assessment:   57 yr old female bcbs [place] ppo pt with pcp Dr Marjory Lies - found on floor when husband returned [place] Husband and friend assisted pt off floor brought in by EMS - UDS positive for benzodiazepines Pt confirms pcp     Subjective/Objective Assessment Detail:   BUN 40 creat 0.88 etoh negative imaging reports without abnormalities pain management Dr Loreta Ave Has contract for pain medicine Pt states left lower leg hurts CM noted pt unable to lift left leg and unable to manuever self to sitting position in bed.     Action/Plan:   CM consulted by Dr Patria Mane.  CM reviewed EPIC notes, labs, imaging CM spoke with Pt and husband Pt needed frequent redirection to remain in the topic of home health Pt frequently stated she was in pain but would stop & argue with her husband   Action/Plan Detail:   or would comment negatively about family friend Husband states the home is  in "bad condition" Reports food, stains, boxes and clutter in the home "no one can get in the home" "She orders items from [employer] and ran our credit cards up"   Anticipated DC Date:  03/04/2013     Status Recommendation to Physician:   Result of Recommendation:    Other ED Services  Consult Working Plan    DC Planning Services  Other  Outpatient Services - Pt will follow up  CM consult   [facility] Choice  HOME HEALTH   Choice offered to / List presented to:  C-1 Patient     HH arranged  HH-1 RN  HH-2 PT       Status of service:  Completed, signed off  ED Comments:   ED Comments Detail:  Pt and husband had various episodes of exchanges of negative comments while CM reviewed home health services At one point the husband had to leave the room to prevent from being tearful. CM reviewed in details  home  health [facility]) (length of stay in home, types of [facility] staff available, coverage, primary caregiver, up to 24 hrs before services may be started),assisted living (ASL- coverage, services offered) and Skilled nursing facilities (snf- coverage and services offered) Pt adamantly states ALF and snf are not options for d/c planning CM provided pt and husband with a list of [place] home health agencies. CM spoke with EDP to initiate orders for [facility] HHPT and RW Husband will contact CM to assist with getting home health services started or to have pcp start them CM left her contact number CM spoke with pt's mother via husband's cell to verify no RW at the home Husband states pt has ordered a Walker from [facility] and he can put it together when they get home CM discussed pt option of d/c home with EMS assist Husband insisted on attempt to see if pt can walk.  CM and CNA attempt to assist pt to sitting a position on bed Pt unable to move bilateral lower extremities off the bed to sit up. Cm encouraged pt d/c home with EMS Husband states pt bedroom is "upstairs" Husband called family friend to assist with clean up of home and to assist him tonight with the pt Reports having a bedside commode but concern with "not enough room to place it" Husband able  to provide food and meds to pt

## 2013-03-04 NOTE — ED Notes (Signed)
Pt up at bedside, yelling, want to go home. States that she can walk. Holding on to door, trying to walk out of room. Husband yelling "sit down, wait on wheelchair".

## 2013-03-04 NOTE — ED Notes (Signed)
Pt c/o of fall this am with left hip pain 10/10. Husband states that pt has "not been herself for the past 2 days'. History of liver problems. States "acts like this when ammonia elevates.".

## 2013-03-04 NOTE — ED Notes (Signed)
Gabriel Rung (husband) [phone]

## 2013-03-04 NOTE — ED Provider Notes (Signed)
Please see Dr. Patria Mane dictated note above. Patient was seen by social work. Request was made for home RN, and physical therapy evaluation. I requested these. She is otherwise to followup with her primary care physician.  Roney Marion, MD 03/04/13 [phone]

## 2013-03-04 NOTE — ED Notes (Signed)
[facility] called  

## 2013-03-04 NOTE — ED Notes (Signed)
Bed: WA06 Expected date:  Expected time:  Means of arrival:  Comments: ems- female, lethargic, hx of liver problems

## 2013-03-05 NOTE — Progress Notes (Signed)
[facility] ED CM called and spoke with Thayer Ohm at Dr Burnett's office to updated on pt's 10/13/1 [facility] ED visit and orders initiated but declined for home health services (r/t wanting to clean their home prior to home health) and they may receive further calls for assistance Information to be passed in Dr [employer] RN

## 2013-03-05 NOTE — Progress Notes (Addendum)
[facility] ED CM received a voice message from Alvino Chapel, pt's husband [phone]) to state they have a rolling walker (borrowed from his mother) Lendon Collar at [phone] (cell) and he states once pt got home she walked from porch to her bed without issues but during the night she fell after using the commode and had to be assisted in getting up with 1+ assist but it took time.  Cm updated him about call to Dr Doristine Counter whom Alvino Chapel states has been encouraging pt to "be more active" CM referred him to Dr Doristine Counter and to Dr Loreta Ave and offered to attempt to assist further if needed Still not wanting home health services at this time

## 2013-03-06 ENCOUNTER — Telehealth (HOSPITAL_COMMUNITY): Payer: Self-pay | Admitting: *Deleted

## 2013-05-01 ENCOUNTER — Encounter (HOSPITAL_COMMUNITY): Payer: Self-pay | Admitting: Emergency Medicine

## 2013-05-01 ENCOUNTER — Emergency Department (HOSPITAL_COMMUNITY)
Admission: EM | Admit: 2013-05-01 | Discharge: 2013-05-01 | Disposition: A | Discharge status: Home / Self Care | Payer: BC Managed Care – PPO | Attending: Emergency Medicine | Admitting: Emergency Medicine

## 2013-05-01 DIAGNOSIS — Z79899 Other long term (current) drug therapy: Secondary | ICD-10-CM | POA: Insufficient documentation

## 2013-05-01 DIAGNOSIS — I82409 Acute embolism and thrombosis of unspecified deep veins of unspecified lower extremity: Secondary | ICD-10-CM | POA: Insufficient documentation

## 2013-05-01 DIAGNOSIS — F17200 Nicotine dependence, unspecified, uncomplicated: Secondary | ICD-10-CM | POA: Insufficient documentation

## 2013-05-01 DIAGNOSIS — R509 Fever, unspecified: Secondary | ICD-10-CM | POA: Insufficient documentation

## 2013-05-01 DIAGNOSIS — IMO0001 Reserved for inherently not codable concepts without codable children: Secondary | ICD-10-CM | POA: Insufficient documentation

## 2013-05-01 DIAGNOSIS — R112 Nausea with vomiting, unspecified: Secondary | ICD-10-CM | POA: Insufficient documentation

## 2013-05-01 DIAGNOSIS — Z792 Long term (current) use of antibiotics: Secondary | ICD-10-CM | POA: Insufficient documentation

## 2013-05-01 DIAGNOSIS — I82402 Acute embolism and thrombosis of unspecified deep veins of left lower extremity: Secondary | ICD-10-CM

## 2013-05-01 DIAGNOSIS — M549 Dorsalgia, unspecified: Secondary | ICD-10-CM | POA: Insufficient documentation

## 2013-05-01 DIAGNOSIS — I10 Essential (primary) hypertension: Secondary | ICD-10-CM | POA: Insufficient documentation

## 2013-05-01 DIAGNOSIS — Z794 Long term (current) use of insulin: Secondary | ICD-10-CM | POA: Insufficient documentation

## 2013-05-01 DIAGNOSIS — Z880 Allergy status to penicillin: Secondary | ICD-10-CM | POA: Insufficient documentation

## 2013-05-01 DIAGNOSIS — E119 Type 2 diabetes mellitus without complications: Secondary | ICD-10-CM | POA: Insufficient documentation

## 2013-05-01 DIAGNOSIS — G8929 Other chronic pain: Secondary | ICD-10-CM | POA: Insufficient documentation

## 2013-05-01 DIAGNOSIS — F411 Generalized anxiety disorder: Secondary | ICD-10-CM | POA: Insufficient documentation

## 2013-05-01 LAB — CBC WITH DIFFERENTIAL/PLATELET
Basophils Absolute: 0.1 10*3/uL (ref 0.0–0.1)
Basophils Relative: 1 % (ref 0–1)
Eosinophils Absolute: 0.7 10*3/uL (ref 0.0–0.7)
Eosinophils Relative: 7 % — ABNORMAL HIGH (ref 0–5)
HCT: 40.8 % (ref 36.0–46.0)
Hemoglobin: 14.2 g/dL (ref 12.0–15.0)
Lymphocytes Relative: 21 % (ref 12–46)
Lymphs Abs: 2.1 10*3/uL (ref 0.7–4.0)
MCH: 32.6 pg (ref 26.0–34.0)
MCHC: 34.8 g/dL (ref 30.0–36.0)
MCV: 93.6 fL (ref 78.0–100.0)
Monocytes Absolute: 0.6 10*3/uL (ref 0.1–1.0)
Monocytes Relative: 6 % (ref 3–12)
Neutro Abs: 6.7 10*3/uL (ref 1.7–7.7)
Neutrophils Relative %: 66 % (ref 43–77)
Platelets: 207 10*3/uL (ref 150–400)
RBC: 4.36 MIL/uL (ref 3.87–5.11)
RDW: 13.6 % (ref 11.5–15.5)
WBC: 10.1 10*3/uL (ref 4.0–10.5)

## 2013-05-01 LAB — BASIC METABOLIC PANEL
BUN: 18 mg/dL (ref 6–23)
CO2: 27 mEq/L (ref 19–32)
Calcium: 9.7 mg/dL (ref 8.4–10.5)
Chloride: 96 mEq/L (ref 96–112)
Creatinine, Ser: 0.87 mg/dL (ref 0.50–1.10)
GFR calc Af Amer: 84 mL/min — ABNORMAL LOW (ref >90)
GFR calc non Af Amer: 73 mL/min — ABNORMAL LOW (ref >90)
Glucose, Bld: 109 mg/dL — ABNORMAL HIGH (ref 70–99)
Potassium: 4.3 mEq/L (ref 3.5–5.1)
Sodium: 134 mEq/L — ABNORMAL LOW (ref 135–145)

## 2013-05-01 MED ORDER — RIVAROXABAN 15 MG PO TABS: 15.0000 mg | ORAL_TABLET | Freq: Two times a day (BID) | ORAL | Status: DC

## 2013-05-01 MED ORDER — OXYCODONE HCL 5 MG PO TABS: 5.0000 mg | ORAL_TABLET | ORAL | Status: DC | PRN

## 2013-05-01 MED ORDER — ONDANSETRON HCL 4 MG/2ML IJ SOLN
4.0000 mg | Freq: Once | INTRAMUSCULAR | Status: AC
  Administered 2013-05-01: 4 mg via INTRAVENOUS
  Filled 2013-05-01: qty 2

## 2013-05-01 MED ORDER — HYDROMORPHONE HCL PF 1 MG/ML IJ SOLN
1.0000 mg | INTRAMUSCULAR | Status: AC
  Administered 2013-05-01: 1 mg via INTRAVENOUS
  Filled 2013-05-01: qty 1

## 2013-05-01 MED ORDER — RIVAROXABAN (XARELTO) EDUCATION KIT FOR DVT/PE PATIENTS
PACK | Freq: Once | Status: DC
  Filled 2013-05-01: qty 1

## 2013-05-01 MED ORDER — RIVAROXABAN 15 MG PO TABS
15.0000 mg | ORAL_TABLET | Freq: Once | ORAL | Status: AC
  Administered 2013-05-01: 15 mg via ORAL
  Filled 2013-05-01: qty 1

## 2013-05-01 NOTE — Progress Notes (Signed)
ED CM consulted regarding medication assistance for Xeralto. Pt presented to ED with pain and swelling to  left leg.  Pt positive for DVT.  Pt being started on Xeralto. Pharm D counseled patient on Xeralto start pack. Pt has [employer]. Provided patient with [facility] card and  instructions on activation. Patient in agreement with plan.  Pt verbalized understanding and appreciation. Plan discussed with Rolla Bing. No further CM needs identified.

## 2013-05-01 NOTE — ED Notes (Signed)
Spoke with Dr. Micheline Maze, pt to have L sided venous duplex study to rule out DVT.

## 2013-05-01 NOTE — ED Notes (Signed)
Pt presents to department for evaluation of L leg swelling. Ongoing x1 week. Pt states 9/10 pain radiating from foot up to thigh. Also states swelling and tenderness to touch. Pedal pulses intact. Reports that she fell several months ago and has issues with L leg at that time. Pt is alert and oriented x4. Denies any recent injury.

## 2013-05-01 NOTE — ED Provider Notes (Signed)
Medical screening examination/treatment/procedure(s) were performed by non-physician practitioner and as supervising physician I was immediately available for consultation/collaboration.  EKG Interpretation   None         Katelyn Stevenson H Scheryl Sanborn, MD 05/01/13 2210 

## 2013-05-01 NOTE — ED Notes (Signed)
PA at bedside.

## 2013-05-01 NOTE — Progress Notes (Signed)
VASCULAR LAB PRELIMINARY  PRELIMINARY  PRELIMINARY  PRELIMINARY  Left lower extremity venous duplex completed.    Preliminary report:  Left: DVT noted in the CFV and FV.  Unable to determine how far distally the thrombus extends in the thigh due to poor sound transmission.  Popliteal vein and calf veins patent.  Superficial thrombosis in the GSV from the proximal thigh to the mid calf.   No Baker's cyst.   Reginna Sermeno, RVT 05/01/2013, 6:47 PM

## 2013-05-01 NOTE — ED Provider Notes (Signed)
CSN: [id number]     Arrival date & time 05/01/13  1707 History   First MD Initiated Contact with Patient 05/01/13 1725     Chief Complaint  Patient presents with  . Leg Swelling   (Consider location/radiation/quality/duration/timing/severity/associated sxs/prior Treatment) HPI Patient p/w left leg pain, swelling, redness x 1 week.  States she did fall in mid October and had limited use of the left leg for approximately one week, chose to do physical therapy exercises on her own.  She has had subjective fevers, chills, nausea. She also admits to being very sedentary.  Denies recent long trips, hospitalization, or surgery.  Denies exogenous estrogen.  No personal history of blood clots.  Mother and grandmother are on coumadin - thinks grandmother had blood clots, unsure about mother.  Denies CP, SOB.     Past Medical History  Diagnosis Date  . Diabetes mellitus   . Hypertension   . Anxiety   . Chronic pain   . Fibromyalgia    Past Surgical History  Procedure Laterality Date  . Rotator cuff repair     No family history on file. History  Substance Use Topics  . Smoking status: Current Every Day Smoker    Types: Cigarettes  . Smokeless tobacco: Not on file  . Alcohol Use: Yes   OB History   Grav Para Term Preterm Abortions TAB SAB Ect Mult Living                 Review of Systems  Constitutional: Positive for fever and chills.  Respiratory: Negative for cough and shortness of breath.   Cardiovascular: Positive for leg swelling. Negative for chest pain.  Gastrointestinal: Positive for vomiting. Negative for nausea, abdominal pain and constipation.  Musculoskeletal: Positive for back pain (mild).  Neurological: Negative for weakness and numbness.    Allergies  Penicillins and Codeine  Home Medications   Current Outpatient Rx  Name  Route  Sig  Dispense  Refill  . ALPRAZolam (XANAX) 1 MG tablet   Oral   Take 1 mg by mouth 3 (three) times daily as needed for anxiety.          . diphenhydrAMINE (BENADRYL) 25 MG tablet   Oral   Take 25 mg by mouth every 6 (six) hours as needed for itching.         . furosemide (LASIX) 40 MG tablet   Oral   Take 40 mg by mouth daily.         . insulin glargine (LANTUS) 100 UNIT/ML injection   Subcutaneous   Inject 55 Units into the skin at bedtime.          Marland Kitchen lactulose (CHRONULAC) 10 GM/15ML solution   Oral   Take 30 g by mouth 3 (three) times daily.         Marland Kitchen omeprazole (PRILOSEC) 20 MG capsule   Oral   Take 20 mg by mouth daily.         Marland Kitchen oxyCODONE (ROXICODONE) 15 MG immediate release tablet   Oral   Take 15 mg by mouth 4 (four) times daily.         . rifaximin (XIFAXAN) 550 MG TABS   Oral   Take 550 mg by mouth 2 (two) times daily.         Marland Kitchen spironolactone (ALDACTONE) 100 MG tablet   Oral   Take 100 mg by mouth daily.         . Thiamine HCl (B-1 PO)  Oral   Take 1 tablet by mouth daily.          BP 100/65  Pulse 91  Temp(Src) 98.1 F (36.7 C) (Oral)  Resp 20  SpO2 100% Physical Exam  Nursing note and vitals reviewed. Constitutional: She appears well-developed and well-nourished. No distress.  HENT:  Head: Normocephalic and atraumatic.  Neck: Neck supple.  Cardiovascular: Normal rate and regular rhythm.   Pulmonary/Chest: Effort normal and breath sounds normal. No respiratory distress. She has no wheezes. She has no rales.  Abdominal: Soft. She exhibits no distension. There is no tenderness. There is no rebound and no guarding.  obese  Musculoskeletal:  Left leg with pitting edema, erythema, warmth, tenderness.    Neurological: She is alert.  Skin: She is not diaphoretic.    ED Course  Procedures (including critical care time) Labs Review Labs Reviewed  CBC WITH DIFFERENTIAL - Abnormal; Notable for the following:    Eosinophils Relative 7 (*)    All other components within normal limits  BASIC METABOLIC PANEL - Abnormal; Notable for the following:    Sodium 134  (*)    Glucose, Bld 109 (*)    GFR calc non Af Amer 73 (*)    GFR calc Af Amer 84 (*)    All other components within normal limits   Imaging Review No results found.  EKG Interpretation   None      Discussed patient and plan with Dr Silverio Lay.    MDM   1. Left leg DVT     Sedentary patient with left leg edema, erythema, and pain x 1 week.  No CP, SOB. Found to have DVT.  Pt given xarelto in ED, d/c home with same and additional pain medication.  Pt is in pain management with oxycodone 15mg  Q 6 hrs, will add to this.  Pt states she has cleared ED prescription with her pain doctor. Xarelto  Education done by pharmacist in ED.  PCP follow up for further prescription and continued management.  Discussed results, findings, treatment, and follow up  with patient.  Pt given return precautions.  Pt verbalizes understanding and agrees with plan.      I doubt any other EMC precluding discharge at this time including, but not necessarily limited to the following: PE    Trixie Dredge, PA-C 05/01/13 2007

## 2013-05-01 NOTE — ED Notes (Signed)
Pt ambulating independently w/ steady gait on d/c in no acute distress, A&Ox4. D/c instructions reviewed w/ pt and family - pt and family deny any further questions or concerns at present. Rx given x2  

## 2013-08-20 ENCOUNTER — Other Ambulatory Visit: Payer: Self-pay | Admitting: Family Medicine

## 2013-08-20 DIAGNOSIS — Z1231 Encounter for screening mammogram for malignant neoplasm of breast: Secondary | ICD-10-CM

## 2013-08-28 ENCOUNTER — Ambulatory Visit: Payer: BC Managed Care – PPO

## 2014-01-04 ENCOUNTER — Observation Stay (HOSPITAL_COMMUNITY)
Admission: EM | Admit: 2014-01-04 | Discharge: 2014-01-06 | Disposition: A | Discharge status: Home / Self Care | Payer: BC Managed Care – PPO | Attending: Internal Medicine | Admitting: Internal Medicine

## 2014-01-04 ENCOUNTER — Encounter (HOSPITAL_COMMUNITY): Payer: Self-pay | Admitting: Emergency Medicine

## 2014-01-04 ENCOUNTER — Emergency Department (HOSPITAL_COMMUNITY): Payer: BC Managed Care – PPO

## 2014-01-04 DIAGNOSIS — M797 Fibromyalgia: Secondary | ICD-10-CM

## 2014-01-04 DIAGNOSIS — F05 Delirium due to known physiological condition: Principal | ICD-10-CM

## 2014-01-04 DIAGNOSIS — IMO0001 Reserved for inherently not codable concepts without codable children: Secondary | ICD-10-CM | POA: Diagnosis not present

## 2014-01-04 DIAGNOSIS — F1020 Alcohol dependence, uncomplicated: Secondary | ICD-10-CM | POA: Insufficient documentation

## 2014-01-04 DIAGNOSIS — Z86718 Personal history of other venous thrombosis and embolism: Secondary | ICD-10-CM | POA: Diagnosis not present

## 2014-01-04 DIAGNOSIS — K7460 Unspecified cirrhosis of liver: Secondary | ICD-10-CM

## 2014-01-04 DIAGNOSIS — Z794 Long term (current) use of insulin: Secondary | ICD-10-CM | POA: Insufficient documentation

## 2014-01-04 DIAGNOSIS — R4182 Altered mental status, unspecified: Secondary | ICD-10-CM | POA: Diagnosis present

## 2014-01-04 DIAGNOSIS — Z880 Allergy status to penicillin: Secondary | ICD-10-CM | POA: Diagnosis not present

## 2014-01-04 DIAGNOSIS — K7010 Alcoholic hepatitis without ascites: Secondary | ICD-10-CM | POA: Diagnosis not present

## 2014-01-04 DIAGNOSIS — G8929 Other chronic pain: Secondary | ICD-10-CM | POA: Diagnosis not present

## 2014-01-04 DIAGNOSIS — E119 Type 2 diabetes mellitus without complications: Secondary | ICD-10-CM | POA: Diagnosis not present

## 2014-01-04 DIAGNOSIS — Z79899 Other long term (current) drug therapy: Secondary | ICD-10-CM | POA: Insufficient documentation

## 2014-01-04 DIAGNOSIS — M542 Cervicalgia: Secondary | ICD-10-CM | POA: Insufficient documentation

## 2014-01-04 DIAGNOSIS — F411 Generalized anxiety disorder: Secondary | ICD-10-CM | POA: Diagnosis not present

## 2014-01-04 DIAGNOSIS — I10 Essential (primary) hypertension: Secondary | ICD-10-CM | POA: Diagnosis not present

## 2014-01-04 DIAGNOSIS — I82409 Acute embolism and thrombosis of unspecified deep veins of unspecified lower extremity: Secondary | ICD-10-CM

## 2014-01-04 DIAGNOSIS — Z885 Allergy status to narcotic agent status: Secondary | ICD-10-CM | POA: Diagnosis not present

## 2014-01-04 DIAGNOSIS — M549 Dorsalgia, unspecified: Secondary | ICD-10-CM | POA: Diagnosis not present

## 2014-01-04 DIAGNOSIS — K7030 Alcoholic cirrhosis of liver without ascites: Secondary | ICD-10-CM | POA: Insufficient documentation

## 2014-01-04 HISTORY — DX: Cervicalgia: M54.2

## 2014-01-04 HISTORY — DX: Acute embolism and thrombosis of unspecified vein: I82.90

## 2014-01-04 HISTORY — DX: Hemochromatosis, unspecified: E83.119

## 2014-01-04 HISTORY — DX: Hepatic failure, unspecified without coma: K72.90

## 2014-01-04 HISTORY — DX: Dorsalgia, unspecified: M54.9

## 2014-01-04 HISTORY — DX: Thrombocytopenia, unspecified: D69.6

## 2014-01-04 HISTORY — DX: Hepatic encephalopathy: K76.82

## 2014-01-04 HISTORY — DX: Other chronic pain: G89.29

## 2014-01-04 HISTORY — DX: Alcoholic hepatitis without ascites: K70.10

## 2014-01-04 LAB — COMPREHENSIVE METABOLIC PANEL
ALT: 17 U/L (ref 0–35)
AST: 19 U/L (ref 0–37)
Albumin: 4.5 g/dL (ref 3.5–5.2)
Alkaline Phosphatase: 92 U/L (ref 39–117)
Anion gap: 16 — ABNORMAL HIGH (ref 5–15)
BUN: 21 mg/dL (ref 6–23)
CO2: 23 mEq/L (ref 19–32)
Calcium: 10.4 mg/dL (ref 8.4–10.5)
Chloride: 92 mEq/L — ABNORMAL LOW (ref 96–112)
Creatinine, Ser: 0.95 mg/dL (ref 0.50–1.10)
GFR calc Af Amer: 75 mL/min — ABNORMAL LOW (ref >90)
GFR calc non Af Amer: 65 mL/min — ABNORMAL LOW (ref >90)
Glucose, Bld: 126 mg/dL — ABNORMAL HIGH (ref 70–99)
Potassium: 3.4 mEq/L — ABNORMAL LOW (ref 3.7–5.3)
Sodium: 131 mEq/L — ABNORMAL LOW (ref 137–147)
Total Bilirubin: 1.2 mg/dL (ref 0.3–1.2)
Total Protein: 8.5 g/dL — ABNORMAL HIGH (ref 6.0–8.3)

## 2014-01-04 LAB — CBC WITH DIFFERENTIAL/PLATELET
Basophils Absolute: 0 10*3/uL (ref 0.0–0.1)
Basophils Relative: 0 % (ref 0–1)
Eosinophils Absolute: 0.1 10*3/uL (ref 0.0–0.7)
Eosinophils Relative: 1 % (ref 0–5)
HCT: 45.3 % (ref 36.0–46.0)
Hemoglobin: 16.5 g/dL — ABNORMAL HIGH (ref 12.0–15.0)
Lymphocytes Relative: 22 % (ref 12–46)
Lymphs Abs: 2.2 10*3/uL (ref 0.7–4.0)
MCH: 32.5 pg (ref 26.0–34.0)
MCHC: 36.4 g/dL — ABNORMAL HIGH (ref 30.0–36.0)
MCV: 89.3 fL (ref 78.0–100.0)
Monocytes Absolute: 0.7 10*3/uL (ref 0.1–1.0)
Monocytes Relative: 7 % (ref 3–12)
Neutro Abs: 7.1 10*3/uL (ref 1.7–7.7)
Neutrophils Relative %: 70 % (ref 43–77)
Platelets: 166 10*3/uL (ref 150–400)
RBC: 5.07 MIL/uL (ref 3.87–5.11)
RDW: 12.8 % (ref 11.5–15.5)
WBC: 10.1 10*3/uL (ref 4.0–10.5)

## 2014-01-04 LAB — RAPID URINE DRUG SCREEN, HOSP PERFORMED
Amphetamines: NOT DETECTED
Barbiturates: NOT DETECTED
Benzodiazepines: NOT DETECTED
Cocaine: NOT DETECTED
Opiates: NOT DETECTED
Tetrahydrocannabinol: NOT DETECTED

## 2014-01-04 LAB — SALICYLATE LEVEL: Salicylate Lvl: <2 mg/dL — ABNORMAL LOW (ref 2.8–20.0)

## 2014-01-04 LAB — URINALYSIS, ROUTINE W REFLEX MICROSCOPIC
Bilirubin Urine: NEGATIVE
Glucose, UA: NEGATIVE mg/dL
Ketones, ur: NEGATIVE mg/dL
Nitrite: NEGATIVE
Protein, ur: NEGATIVE mg/dL
Specific Gravity, Urine: 1.018 (ref 1.005–1.030)
Urobilinogen, UA: 0.2 mg/dL (ref 0.0–1.0)
pH: 5.5 (ref 5.0–8.0)

## 2014-01-04 LAB — AMMONIA: Ammonia: 21 umol/L (ref 11–60)

## 2014-01-04 LAB — TROPONIN I: Troponin I: <0.3 ng/mL (ref <0.30)

## 2014-01-04 LAB — URINE MICROSCOPIC-ADD ON

## 2014-01-04 LAB — ACETAMINOPHEN LEVEL: Acetaminophen (Tylenol), Serum: <15 ug/mL (ref 10–30)

## 2014-01-04 LAB — PROTIME-INR
INR: 1.53 — ABNORMAL HIGH (ref 0.00–1.49)
Prothrombin Time: 18.4 seconds — ABNORMAL HIGH (ref 11.6–15.2)

## 2014-01-04 LAB — ETHANOL: Alcohol, Ethyl (B): <11 mg/dL (ref 0–11)

## 2014-01-04 LAB — LACTIC ACID, PLASMA: Lactic Acid, Venous: 1.2 mmol/L (ref 0.5–2.2)

## 2014-01-04 MED ORDER — SODIUM CHLORIDE 0.9 % IJ SOLN
3.0000 mL | Freq: Two times a day (BID) | INTRAMUSCULAR | Status: DC
  Administered 2014-01-05 – 2014-01-06 (×2): 3 mL via INTRAVENOUS

## 2014-01-04 MED ORDER — OXYCODONE HCL 5 MG PO TABS: 5.0000 mg | ORAL_TABLET | ORAL | Status: DC | PRN

## 2014-01-04 MED ORDER — RIFAXIMIN 550 MG PO TABS
550.0000 mg | ORAL_TABLET | Freq: Two times a day (BID) | ORAL | Status: DC
  Administered 2014-01-05 – 2014-01-06 (×3): 550 mg via ORAL
  Filled 2014-01-04 (×4): qty 1

## 2014-01-04 MED ORDER — FOLIC ACID 1 MG PO TABS
1.0000 mg | ORAL_TABLET | Freq: Every day | ORAL | Status: DC
  Administered 2014-01-05 – 2014-01-06 (×2): 1 mg via ORAL
  Filled 2014-01-04 (×2): qty 1

## 2014-01-04 MED ORDER — INSULIN ASPART 100 UNIT/ML ~~LOC~~ SOLN
0.0000 [IU] | Freq: Three times a day (TID) | SUBCUTANEOUS | Status: DC
  Administered 2014-01-05 (×3): 1 [IU] via SUBCUTANEOUS

## 2014-01-04 MED ORDER — VITAMIN B-1 100 MG PO TABS
100.0000 mg | ORAL_TABLET | Freq: Every day | ORAL | Status: DC
  Administered 2014-01-05 – 2014-01-06 (×2): 100 mg via ORAL
  Filled 2014-01-04 (×2): qty 1

## 2014-01-04 MED ORDER — ONDANSETRON HCL 4 MG PO TABS
4.0000 mg | ORAL_TABLET | Freq: Four times a day (QID) | ORAL | Status: DC | PRN
Start: 2014-01-04 — End: 2014-01-06

## 2014-01-04 MED ORDER — ONDANSETRON HCL 4 MG/2ML IJ SOLN
4.0000 mg | Freq: Four times a day (QID) | INTRAMUSCULAR | Status: DC | PRN
  Filled 2014-01-04: qty 2

## 2014-01-04 MED ORDER — PANTOPRAZOLE SODIUM 40 MG PO TBEC
40.0000 mg | DELAYED_RELEASE_TABLET | Freq: Every day | ORAL | Status: DC
  Administered 2014-01-05 – 2014-01-06 (×2): 40 mg via ORAL
  Filled 2014-01-04 (×2): qty 1

## 2014-01-04 MED ORDER — SODIUM CHLORIDE 0.9 % IV SOLN
INTRAVENOUS | Status: DC
  Administered 2014-01-05: 03:00:00 via INTRAVENOUS

## 2014-01-04 MED ORDER — INSULIN ASPART 100 UNIT/ML ~~LOC~~ SOLN: 0.0000 [IU] | Freq: Every day | SUBCUTANEOUS | Status: DC

## 2014-01-04 MED ORDER — SODIUM CHLORIDE 0.9 % IV SOLN
INTRAVENOUS | Status: DC
  Administered 2014-01-04 – 2014-01-05 (×2): via INTRAVENOUS

## 2014-01-04 MED ORDER — HEPARIN SODIUM (PORCINE) 5000 UNIT/ML IJ SOLN: 5000.0000 [IU] | Freq: Three times a day (TID) | INTRAMUSCULAR | Status: DC

## 2014-01-04 NOTE — H&P (Signed)
[facility] History and Physical  Patient: Katelyn BailiffJan E Shults  ZOX:[id number]  DOB: 1956-01-31  DOS: the patient was seen and examined on 01/04/2014 PCP: Delorse LekBURNETT,BRENT A, MD  Chief Complaint: Fall  HPI: Katelyn ForemanJan E Jon Stevenson is a 58 y.o. 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 Patient presented with acute confusion state and a fall. History was obtained from ED documentation. As per the documentation patient's husband received a call by EMS that the patient was found at the bottom of the stairs. It is unclear who call the EMS. Patient was confused at the time of the EMS arrival was talking out of context, she has been talking to herself throughout the route. As per the husband patient was out of her chronic anxiety and pain medication prescriptions 2 weeks ago and was unable to refill them yet. Patient mentioned to me that she was standing at the stair and then fell down and unable to define whether she had dizziness lightheadedness or she passed out or she missed a step. At the time of my evaluation patient denied any complaint of any fever, chills, headache, cough, chest pain, palpitation, shortness of breath, orthopnea, PND, nausea, vomiting, abdominal pain, diarrhea, constipation, active bleeding, burning urination, dizziness, pedal edema,  focal neurological deficit.  Patient mentions she has been taking rivaroxaban once a day, rifaximin twice a day, lactulose regularly with only one bowel movement/day, also Aldactone Lasix. She also mentions she's taking her Lantus. Husband was not available to verify this information.  The patient is coming from home. And at her baseline independent for most of her ADL.  Review of Systems: as mentioned in the history of present illness.  A Comprehensive review of the other systems is negative.  Past Medical  History  Diagnosis Date  . Diabetes mellitus   . Hypertension   . Anxiety   . Chronic pain   . Fibromyalgia   . Chronic neck pain   . Chronic back pain   . Hemochromatosis   . Alcoholic hepatitis   . Hepatic encephalopathy   . Thrombocytopenia    Past Surgical History  Procedure Laterality Date  . Rotator cuff repair     Social History:  reports that she has been smoking Cigarettes.  She has been smoking about 0.00 packs per day. She does not have any smokeless tobacco history on file. She reports that she drinks alcohol. She reports that she does not use illicit drugs.  Allergies  Allergen Reactions  . Penicillins Anaphylaxis and Swelling  . Codeine Itching    Benadryl is given to combat symptoms    History reviewed. No pertinent family history.  Prior to Admission medications   Medication Sig Start Date End Date Taking? Authorizing Provider  ALPRAZolam (XANAX PO) Take by mouth.   Yes Historical Provider, MD  insulin glargine (LANTUS) 100 units/mL SOLN Inject 56 Units into the skin at bedtime.   Yes Historical Provider, MD  lactulose (CHRONULAC) 10 GM/15ML solution Take 30 g by mouth 3 (three) times daily.   Yes Historical Provider, MD  rivaroxaban (XARELTO) 20 MG TABS tablet Take 20 mg by mouth daily.   Yes Historical Provider, MD  furosemide (LASIX) 40 MG tablet Take 40 mg by mouth daily.    Historical Provider, MD  omeprazole (PRILOSEC) 20 MG capsule Take 20 mg by mouth daily.    Historical Provider, MD  oxyCODONE (ROXICODONE) 15  MG immediate release tablet Take 15 mg by mouth 4 (four) times daily.    Historical Provider, MD  oxyCODONE (ROXICODONE) 5 MG immediate release tablet Take 1 tablet (5 mg total) by mouth every 4 (four) hours as needed for severe pain. 05/01/13   Trixie Dredge, PA-C  rifaximin (XIFAXAN) 550 MG TABS Take 550 mg by mouth 2 (two) times daily.    Historical Provider, MD  spironolactone (ALDACTONE) 100 MG tablet Take 100 mg by mouth daily.    Historical  Provider, MD  Thiamine HCl (B-1 PO) Take 1 tablet by mouth daily.    Historical Provider, MD    Physical Exam: Filed Vitals:   01/04/14 2215 01/04/14 2218 01/04/14 2230 01/04/14 2301  BP: 117/80  105/69 117/80  Pulse:  85 88   Temp:  98.4 F (36.9 C)    TempSrc:  Oral    Resp:      SpO2:  98% 98%     General: Alert, Awake and Oriented to Time, Place and Person. Appear in mild distress Eyes: PERRL, squint present with adduction limitation on the right side. No ptosis noted, Dilated pupils bilaterally  ENT: Oral Mucosa clear moist. Neck: No JVD Cardiovascular: S1 and S2 Present, no Murmur, Peripheral Pulses Present Respiratory: Bilateral Air entry equal and Decreased, Clear to Auscultation, no Crackles, no wheezes Abdomen: Bowel Sound Present, Soft and Non tender Skin: no Rash Extremities: no Pedal edema, no calf tenderness Neurologic: Grossly no focal neuro deficit other than above.  Labs on Admission:  CBC:  Recent Labs Lab 01/04/14 1920  WBC 10.1  NEUTROABS 7.1  HGB 16.5*  HCT 45.3  MCV 89.3  PLT 166    CMP     Component Value Date/Time   NA 131* 01/04/2014 1920   K 3.4* 01/04/2014 1920   CL 92* 01/04/2014 1920   CO2 23 01/04/2014 1920   GLUCOSE 126* 01/04/2014 1920   BUN 21 01/04/2014 1920   CREATININE 0.95 01/04/2014 1920   CALCIUM 10.4 01/04/2014 1920   PROT 8.5* 01/04/2014 1920   ALBUMIN 4.5 01/04/2014 1920   AST 19 01/04/2014 1920   ALT 17 01/04/2014 1920   ALKPHOS 92 01/04/2014 1920   BILITOT 1.2 01/04/2014 1920   GFRNONAA 65* 01/04/2014 1920   GFRAA 75* 01/04/2014 1920    No results found for this basename: LIPASE, AMYLASE,  in the last 168 hours  Recent Labs Lab 01/04/14 1920  AMMONIA 21     Recent Labs Lab 01/04/14 1920  TROPONINI <0.30   BNP (last 3 results) No results found for this basename: PROBNP,  in the last 8760 hours  Radiological Exams on Admission: Dg Chest 2 View  01/04/2014   CLINICAL DATA:  Fall, confusion  EXAM: CHEST  2  VIEW  COMPARISON:  03/04/2013  FINDINGS: The heart size and mediastinal contours are within normal limits. Both lungs are clear. The visualized skeletal structures are unremarkable.  IMPRESSION: No active cardiopulmonary disease.   Electronically Signed   By: Charlett Nose M.D.   On: 01/04/2014 20:04   Ct Head Wo Contrast  01/04/2014   CLINICAL DATA:  Confusion, fall, found of bottom of stairs, history diabetes, hypertension  EXAM: CT HEAD WITHOUT CONTRAST  CT CERVICAL SPINE WITHOUT CONTRAST  TECHNIQUE: Multidetector CT imaging of the head and cervical spine was performed following the standard protocol without intravenous contrast. Multiplanar CT image reconstructions of the cervical spine were also generated.  COMPARISON:  03/04/2013  FINDINGS: CT HEAD FINDINGS  Minimal atrophy.  Normal ventricular morphology.  No midline shift or mass effect.  Otherwise normal appearance brain parenchyma.  No intracranial hemorrhage, mass lesion or evidence acute infarction.  No extra-axial fluid collections.  Bones and sinuses unremarkable.  CT CERVICAL SPINE FINDINGS  Visualized skullbase intact.  Osseous mineralization normal.  Prevertebral soft tissues normal thickness.  Disc space narrowing at C5-C6 and C6-C7.  Vertebral body heights maintained without fracture or subluxation.  Minimal scattered facet degenerative changes.  Old healed fracture posterior RIGHT first rib.  Lung apices demonstrate minimal emphysematous changes but are otherwise clear.  IMPRESSION: No acute intracranial abnormalities.  No acute cervical spine abnormalities.  Minimal degenerative disc and facet disease changes of the cervical spine.   Electronically Signed   By: Ulyses Southward M.D.   On: 01/04/2014 19:47   Ct Cervical Spine Wo Contrast  01/04/2014   CLINICAL DATA:  Confusion, fall, found of bottom of stairs, history diabetes, hypertension  EXAM: CT HEAD WITHOUT CONTRAST  CT CERVICAL SPINE WITHOUT CONTRAST  TECHNIQUE: Multidetector CT imaging of  the head and cervical spine was performed following the standard protocol without intravenous contrast. Multiplanar CT image reconstructions of the cervical spine were also generated.  COMPARISON:  03/04/2013  FINDINGS: CT HEAD FINDINGS  Minimal atrophy.  Normal ventricular morphology.  No midline shift or mass effect.  Otherwise normal appearance brain parenchyma.  No intracranial hemorrhage, mass lesion or evidence acute infarction.  No extra-axial fluid collections.  Bones and sinuses unremarkable.  CT CERVICAL SPINE FINDINGS  Visualized skullbase intact.  Osseous mineralization normal.  Prevertebral soft tissues normal thickness.  Disc space narrowing at C5-C6 and C6-C7.  Vertebral body heights maintained without fracture or subluxation.  Minimal scattered facet degenerative changes.  Old healed fracture posterior RIGHT first rib.  Lung apices demonstrate minimal emphysematous changes but are otherwise clear.  IMPRESSION: No acute intracranial abnormalities.  No acute cervical spine abnormalities.  Minimal degenerative disc and facet disease changes of the cervical spine.   Electronically Signed   By: Ulyses Southward M.D.   On: 01/04/2014 19:47    Assessment/Plan Principal Problem:   Altered mental status Active Problems:   Acute confusional state   Hypertension   Fibromyalgia   Hemochromatosis   Alcoholic hepatitis   DVT (deep venous thrombosis)   1. Altered mental status The patient is presenting with acute confusion. Extensive evaluation in the ED including urine drug screen urine culture urine analysis Tylenol level at the level salicylate level, ammonia level, lactic acid, troponin, proBNP, CBC and CMP as well as CT head C-spine it does not show any acute abnormality. On examination she appears to have right medial rectus palsy, without any ptosis or ophthalmoplegia. At present the patient will be admitted in the hospital we will monitor her on telemetry we will obtain serial neuro checks on  her. Attempt to reach the husband have been unsuccessful. We will obtain MRI MRA of the brain in the morning for further workup. Echocardiogram as well to look for structural abnormalities. Possible etiology also include acute psychosis, so once her medical evaluation is clear psychiatric she be consulted.  2. Cirrhosis of liver. Patient has history of cirrhosis of liver and is on appropriate medication. At present it is unclear whether she's taking them or not as I'm unable to take the reach the husband. Consulted her pharmacy to verify her medication list from her pharmacy. At present I will continue her home medications including Robaxin, Protonix, thiamine. I would hold  Aldactone and furosemide at present until medications are identified.  3. History of DVT. Patient had a history of DVT if she this was uncooperative and she should have completed her course of DVT treatment at this point. Consulting pharmacy to verify the use andof anticoagulation. Also getting lower extremity Doppler and echocardiogram for further workup.  DVT Prophylaxis: mechanical compression device Nutrition:  regular cardiac diabetic diet  Code Status:  full  Family Communication: unable to reach the husband given numbers  Disposition: Admitted to observation in telemetry unit.  Author: Lynden Oxford, MD [facility] Pager: [phone] 01/04/2014, 11:40 PM    If 7PM-7AM, please contact night-coverage [email or web address] Password [email or web address]  **Disclaimer: This note may have been dictated with voice recognition software. Similar sounding words can inadvertently be transcribed and this note may contain transcription errors which may not have been corrected upon publication of note.**

## 2014-01-04 NOTE — ED Provider Notes (Signed)
CSN: [id number]     Arrival date & time 01/04/14  1823 History   First MD Initiated Contact with Patient 01/04/14 1836     Chief Complaint  Patient presents with  . Fall      Patient is a 58 y.o. female presenting with fall. The history is provided by the spouse and the patient. The history is limited by the condition of the patient (AMS).  Fall  Pt was seen at 1845. Per EMS, pt and her husband: pt's husband states pt "hasn't been acting right" since yesterday. Pt's husband describes this as "acting confused" and "not herself." Pt's husband states he was called at work today by EMS who told him his wife was found at the bottom of their stairs. EMS noted pt was confused: stated to them she was "talking to god" and her husband was "with his girlfriend so she went outside because she knew they were going to have sex." Pt has been mumbling to herself en route. Pt's husband states she "lost" her chronic pain meds and anxiety meds prescriptions 2 weeks ago and has been unable to get a refill "yet" from her Pain Management doctor. Pt states she "doesn't remember anything" regarding events today PTA. No reported fevers, no N/V/D, no CP/SOB, no cough, no abd pain, no focal motor weakness, no tingling/numbness in extremities.     Past Medical History  Diagnosis Date  . Diabetes mellitus   . Hypertension   . Anxiety   . Chronic pain   . Fibromyalgia   . Chronic neck pain   . Chronic back pain   . Hemochromatosis   . Alcoholic hepatitis   . Hepatic encephalopathy   . Thrombocytopenia    Past Surgical History  Procedure Laterality Date  . Rotator cuff repair      History  Substance Use Topics  . Smoking status: Current Every Day Smoker    Types: Cigarettes  . Smokeless tobacco: Not on file  . Alcohol Use: Yes    Review of Systems  Unable to perform ROS: Mental status change      Allergies  Penicillins and Codeine  Home Medications   Prior to Admission medications    Medication Sig Start Date End Date Taking? Authorizing Provider  ALPRAZolam (XANAX PO) Take by mouth.   Yes Historical Provider, MD  insulin glargine (LANTUS) 100 units/mL SOLN Inject 56 Units into the skin at bedtime.   Yes Historical Provider, MD  lactulose (CHRONULAC) 10 GM/15ML solution Take 30 g by mouth 3 (three) times daily.   Yes Historical Provider, MD  rivaroxaban (XARELTO) 20 MG TABS tablet Take 20 mg by mouth daily.   Yes Historical Provider, MD  furosemide (LASIX) 40 MG tablet Take 40 mg by mouth daily.    Historical Provider, MD  omeprazole (PRILOSEC) 20 MG capsule Take 20 mg by mouth daily.    Historical Provider, MD  oxyCODONE (ROXICODONE) 15 MG immediate release tablet Take 15 mg by mouth 4 (four) times daily.    Historical Provider, MD  oxyCODONE (ROXICODONE) 5 MG immediate release tablet Take 1 tablet (5 mg total) by mouth every 4 (four) hours as needed for severe pain. 05/01/13   Trixie DredgeEmily West, PA-C  rifaximin (XIFAXAN) 550 MG TABS Take 550 mg by mouth 2 (two) times daily.    Historical Provider, MD  spironolactone (ALDACTONE) 100 MG tablet Take 100 mg by mouth daily.    Historical Provider, MD  Thiamine HCl (B-1 PO) Take 1 tablet by mouth  daily.    Historical Provider, MD   BP 123/73  Pulse 82  Temp(Src) 98.4 F (36.9 C) (Oral)  Resp 29  SpO2 99% Physical Exam 1850: Physical examination:  Nursing notes reviewed; Vital signs and O2 SAT reviewed;  Constitutional: Well developed, Well nourished, Well hydrated, In no acute distress; Head:  Normocephalic, atraumatic; Eyes: EOMI, PERRL, No scleral icterus; ENMT: Mouth and pharynx normal, Mucous membranes moist; Neck: Supple, Full range of motion, No lymphadenopathy; Cardiovascular: Regular rate and rhythm, No gallop; Respiratory: Breath sounds clear & equal bilaterally, No wheezes.  Speaking full sentences with ease, Normal respiratory effort/excursion; Chest: Nontender, Movement normal; Abdomen: Soft, Nontender, Nondistended,  Normal bowel sounds; Genitourinary: No CVA tenderness; Extremities: Pulses normal, No tenderness, No edema, No calf edema or asymmetry.; Neuro: Awake, alert, repeats "I don't remember" when asked to recall events PTA. Eyes open spontaneously. Major CN grossly intact. Speaks few words clearly. No facial droop. Grips equal. Strength 4/5 equal bilat UE's and LE's.;;  Skin: Color normal, Warm, Dry.; Psych:  Affect flat, poor eye contact.     ED Course  Procedures     EKG Interpretation   Date/Time:  Saturday January 04 2014 18:24:15 EDT Ventricular Rate:  85 PR Interval:  200 QRS Duration: 100 QT Interval:  395 QTC Calculation: 470 R Axis:   -24 Text Interpretation:  Sinus rhythm Borderline left axis deviation Artifact  When compared with ECG of 10/24/2009 No significant change was found  Confirmed by [facility]  MD, Nicholos Johns [phone]) on 01/04/2014 7:31:34 PM      MDM  MDM Reviewed: previous chart, nursing note and vitals Reviewed previous: labs and ECG Interpretation: labs, ECG, x-ray and CT scan   Results for orders placed during the hospital encounter of 01/04/14  ACETAMINOPHEN LEVEL      Result Value Ref Range   Acetaminophen (Tylenol), Serum <15.0  10 - 30 ug/mL  ETHANOL      Result Value Ref Range   Alcohol, Ethyl (B) <11  0 - 11 mg/dL  SALICYLATE LEVEL      Result Value Ref Range   Salicylate Lvl <2.0 (*) 2.8 - 20.0 mg/dL  URINE RAPID DRUG SCREEN (HOSP PERFORMED)      Result Value Ref Range   Opiates NONE DETECTED  NONE DETECTED   Cocaine NONE DETECTED  NONE DETECTED   Benzodiazepines NONE DETECTED  NONE DETECTED   Amphetamines NONE DETECTED  NONE DETECTED   Tetrahydrocannabinol NONE DETECTED  NONE DETECTED   Barbiturates NONE DETECTED  NONE DETECTED  CBC WITH DIFFERENTIAL      Result Value Ref Range   WBC 10.1  4.0 - 10.5 K/uL   RBC 5.07  3.87 - 5.11 MIL/uL   Hemoglobin 16.5 (*) 12.0 - 15.0 g/dL   HCT 60.4  54.0 - 98.1 %   MCV 89.3  78.0 - 100.0 fL   MCH 32.5   26.0 - 34.0 pg   MCHC 36.4 (*) 30.0 - 36.0 g/dL   RDW 19.1  47.8 - 29.5 %   Platelets 166  150 - 400 K/uL   Neutrophils Relative % 70  43 - 77 %   Neutro Abs 7.1  1.7 - 7.7 K/uL   Lymphocytes Relative 22  12 - 46 %   Lymphs Abs 2.2  0.7 - 4.0 K/uL   Monocytes Relative 7  3 - 12 %   Monocytes Absolute 0.7  0.1 - 1.0 K/uL   Eosinophils Relative 1  0 - 5 %  Eosinophils Absolute 0.1  0.0 - 0.7 K/uL   Basophils Relative 0  0 - 1 %   Basophils Absolute 0.0  0.0 - 0.1 K/uL  URINALYSIS, ROUTINE W REFLEX MICROSCOPIC      Result Value Ref Range   Color, Urine AMBER (*) YELLOW   APPearance CLOUDY (*) CLEAR   Specific Gravity, Urine 1.018  1.005 - 1.030   pH 5.5  5.0 - 8.0   Glucose, UA NEGATIVE  NEGATIVE mg/dL   Hgb urine dipstick TRACE (*) NEGATIVE   Bilirubin Urine NEGATIVE  NEGATIVE   Ketones, ur NEGATIVE  NEGATIVE mg/dL   Protein, ur NEGATIVE  NEGATIVE mg/dL   Urobilinogen, UA 0.2  0.0 - 1.0 mg/dL   Nitrite NEGATIVE  NEGATIVE   Leukocytes, UA TRACE (*) NEGATIVE  COMPREHENSIVE METABOLIC PANEL      Result Value Ref Range   Sodium 131 (*) 137 - 147 mEq/L   Potassium 3.4 (*) 3.7 - 5.3 mEq/L   Chloride 92 (*) 96 - 112 mEq/L   CO2 23  19 - 32 mEq/L   Glucose, Bld 126 (*) 70 - 99 mg/dL   BUN 21  6 - 23 mg/dL   Creatinine, Ser 1.61  0.50 - 1.10 mg/dL   Calcium 09.6  8.4 - 04.5 mg/dL   Total Protein 8.5 (*) 6.0 - 8.3 g/dL   Albumin 4.5  3.5 - 5.2 g/dL   AST 19  0 - 37 U/L   ALT 17  0 - 35 U/L   Alkaline Phosphatase 92  39 - 117 U/L   Total Bilirubin 1.2  0.3 - 1.2 mg/dL   GFR calc non Af Amer 65 (*) >90 mL/min   GFR calc Af Amer 75 (*) >90 mL/min   Anion gap 16 (*) 5 - 15  AMMONIA      Result Value Ref Range   Ammonia 21  11 - 60 umol/L  LACTIC ACID, PLASMA      Result Value Ref Range   Lactic Acid, Venous 1.2  0.5 - 2.2 mmol/L  TROPONIN I      Result Value Ref Range   Troponin I <0.30  <0.30 ng/mL  PROTIME-INR      Result Value Ref Range   Prothrombin Time 18.4 (*) 11.6 -  15.2 seconds   INR 1.53 (*) 0.00 - 1.49  URINE MICROSCOPIC-ADD ON      Result Value Ref Range   Squamous Epithelial / LPF FEW (*) RARE   WBC, UA 0-2  <3 WBC/hpf   RBC / HPF 0-2  <3 RBC/hpf   Dg Chest 2 View 01/04/2014   CLINICAL DATA:  Fall, confusion  EXAM: CHEST  2 VIEW  COMPARISON:  03/04/2013  FINDINGS: The heart size and mediastinal contours are within normal limits. Both lungs are clear. The visualized skeletal structures are unremarkable.  IMPRESSION: No active cardiopulmonary disease.   Electronically Signed   By: Charlett Nose M.D.   On: 01/04/2014 20:04   Ct Head Wo Contrast 01/04/2014   CLINICAL DATA:  Confusion, fall, found of bottom of stairs, history diabetes, hypertension  EXAM: CT HEAD WITHOUT CONTRAST  CT CERVICAL SPINE WITHOUT CONTRAST  TECHNIQUE: Multidetector CT imaging of the head and cervical spine was performed following the standard protocol without intravenous contrast. Multiplanar CT image reconstructions of the cervical spine were also generated.  COMPARISON:  03/04/2013  FINDINGS: CT HEAD FINDINGS  Minimal atrophy.  Normal ventricular morphology.  No midline shift or mass effect.  Otherwise normal appearance brain parenchyma.  No intracranial hemorrhage, mass lesion or evidence acute infarction.  No extra-axial fluid collections.  Bones and sinuses unremarkable.  CT CERVICAL SPINE FINDINGS  Visualized skullbase intact.  Osseous mineralization normal.  Prevertebral soft tissues normal thickness.  Disc space narrowing at C5-C6 and C6-C7.  Vertebral body heights maintained without fracture or subluxation.  Minimal scattered facet degenerative changes.  Old healed fracture posterior RIGHT first rib.  Lung apices demonstrate minimal emphysematous changes but are otherwise clear.  IMPRESSION: No acute intracranial abnormalities.  No acute cervical spine abnormalities.  Minimal degenerative disc and facet disease changes of the cervical spine.   Electronically Signed   By: Ulyses Southward  M.D.   On: 01/04/2014 19:47   Ct Cervical Spine Wo Contrast 01/04/2014   CLINICAL DATA:  Confusion, fall, found of bottom of stairs, history diabetes, hypertension  EXAM: CT HEAD WITHOUT CONTRAST  CT CERVICAL SPINE WITHOUT CONTRAST  TECHNIQUE: Multidetector CT imaging of the head and cervical spine was performed following the standard protocol without intravenous contrast. Multiplanar CT image reconstructions of the cervical spine were also generated.  COMPARISON:  03/04/2013  FINDINGS: CT HEAD FINDINGS  Minimal atrophy.  Normal ventricular morphology.  No midline shift or mass effect.  Otherwise normal appearance brain parenchyma.  No intracranial hemorrhage, mass lesion or evidence acute infarction.  No extra-axial fluid collections.  Bones and sinuses unremarkable.  CT CERVICAL SPINE FINDINGS  Visualized skullbase intact.  Osseous mineralization normal.  Prevertebral soft tissues normal thickness.  Disc space narrowing at C5-C6 and C6-C7.  Vertebral body heights maintained without fracture or subluxation.  Minimal scattered facet degenerative changes.  Old healed fracture posterior RIGHT first rib.  Lung apices demonstrate minimal emphysematous changes but are otherwise clear.  IMPRESSION: No acute intracranial abnormalities.  No acute cervical spine abnormalities.  Minimal degenerative disc and facet disease changes of the cervical spine.   Electronically Signed   By: Ulyses Southward M.D.   On: 01/04/2014 19:47    2200:  Pt is not orthostatic. Minimally conversive while in the ED with flat affect and poor eye contact. No focal neuro deficits.  T/C to [facility], case discussed, including:  HPI, pertinent PM/SHx, VS/PE, dx testing, ED course and treatment:  Agreeable to admit, requests to write temporary orders, obtain tele bed to team 10.    Samuel Jester, DO 01/07/14 1754

## 2014-01-04 NOTE — ED Notes (Signed)
Transporting patient to new room assignment. 

## 2014-01-04 NOTE — ED Notes (Addendum)
Pt reports to the ED for eval of confusion and fall. Pt is from home and was found at the bottom of her stairs. Pt reports she does not remember how she got there or the events prior to the fall. Pt reports she was talking to God and her husband Gabriel RungJoe was with his girlfriend so she went outside because she knew they were going to have sex. Pt intermittently mumbling to herself. Pt denies any SI/HI. Pt denies any ETOH intake. No obvious swelling, ecchymosis, injury, or deformity noted.Per EMS: Pt passed SCCA and stroke assessment. Pts husband stated the patient has been confused all day. No obvious injury, ecchymosis, or deformity noted. Pt reports she does not take blood thinners. Pt NSR on the monitor. Pt alert and oriented to person, place, situation, and time but does not remember the events prior to the fall. Resp e/u and skin warm and dry.

## 2014-01-05 ENCOUNTER — Encounter (HOSPITAL_COMMUNITY): Payer: Self-pay | Admitting: *Deleted

## 2014-01-05 ENCOUNTER — Observation Stay (HOSPITAL_COMMUNITY): Payer: BC Managed Care – PPO

## 2014-01-05 DIAGNOSIS — E119 Type 2 diabetes mellitus without complications: Secondary | ICD-10-CM

## 2014-01-05 DIAGNOSIS — K7460 Unspecified cirrhosis of liver: Secondary | ICD-10-CM

## 2014-01-05 DIAGNOSIS — I82409 Acute embolism and thrombosis of unspecified deep veins of unspecified lower extremity: Secondary | ICD-10-CM

## 2014-01-05 DIAGNOSIS — R4182 Altered mental status, unspecified: Secondary | ICD-10-CM

## 2014-01-05 LAB — COMPREHENSIVE METABOLIC PANEL
ALT: 14 U/L (ref 0–35)
AST: 17 U/L (ref 0–37)
Albumin: 3.8 g/dL (ref 3.5–5.2)
Alkaline Phosphatase: 82 U/L (ref 39–117)
Anion gap: 11 (ref 5–15)
BUN: 17 mg/dL (ref 6–23)
CO2: 22 mEq/L (ref 19–32)
Calcium: 10 mg/dL (ref 8.4–10.5)
Chloride: 98 mEq/L (ref 96–112)
Creatinine, Ser: 0.9 mg/dL (ref 0.50–1.10)
GFR calc Af Amer: 80 mL/min — ABNORMAL LOW (ref >90)
GFR calc non Af Amer: 69 mL/min — ABNORMAL LOW (ref >90)
Glucose, Bld: 152 mg/dL — ABNORMAL HIGH (ref 70–99)
Potassium: 4.4 mEq/L (ref 3.7–5.3)
Sodium: 131 mEq/L — ABNORMAL LOW (ref 137–147)
Total Bilirubin: 1 mg/dL (ref 0.3–1.2)
Total Protein: 7.2 g/dL (ref 6.0–8.3)

## 2014-01-05 LAB — MRSA PCR SCREENING: MRSA by PCR: NEGATIVE

## 2014-01-05 LAB — GLUCOSE, CAPILLARY
Glucose-Capillary: 150 mg/dL — ABNORMAL HIGH (ref 70–99)
Glucose-Capillary: 142 mg/dL — ABNORMAL HIGH (ref 70–99)
Glucose-Capillary: 139 mg/dL — ABNORMAL HIGH (ref 70–99)
Glucose-Capillary: 123 mg/dL — ABNORMAL HIGH (ref 70–99)

## 2014-01-05 LAB — AMMONIA: Ammonia: 18 umol/L (ref 11–60)

## 2014-01-05 MED ORDER — INSULIN GLARGINE 100 UNIT/ML ~~LOC~~ SOLN
25.0000 [IU] | Freq: Every day | SUBCUTANEOUS | Status: DC
  Administered 2014-01-05: 25 [IU] via SUBCUTANEOUS
  Filled 2014-01-05 (×2): qty 0.25

## 2014-01-05 MED ORDER — LACTULOSE 10 GM/15ML PO SOLN
30.0000 g | Freq: Three times a day (TID) | ORAL | Status: DC
  Administered 2014-01-05 (×2): 30 g via ORAL
  Filled 2014-01-05 (×5): qty 45

## 2014-01-05 MED ORDER — RIVAROXABAN 20 MG PO TABS
20.0000 mg | ORAL_TABLET | Freq: Every day | ORAL | Status: DC
  Administered 2014-01-05 (×2): 20 mg via ORAL
  Filled 2014-01-05 (×3): qty 1

## 2014-01-05 MED ORDER — FUROSEMIDE 40 MG PO TABS
40.0000 mg | ORAL_TABLET | Freq: Every day | ORAL | Status: DC
  Administered 2014-01-05 – 2014-01-06 (×2): 40 mg via ORAL
  Filled 2014-01-05 (×2): qty 1

## 2014-01-05 MED ORDER — OXYCODONE HCL 5 MG PO TABS
10.0000 mg | ORAL_TABLET | ORAL | Status: DC | PRN
  Administered 2014-01-06 (×2): 10 mg via ORAL
  Filled 2014-01-05 (×2): qty 2

## 2014-01-05 MED ORDER — FUROSEMIDE 20 MG PO TABS
10.0000 mg | ORAL_TABLET | Freq: Every day | ORAL | Status: DC
  Filled 2014-01-05: qty 0.5

## 2014-01-05 MED ORDER — SPIRONOLACTONE 100 MG PO TABS
100.0000 mg | ORAL_TABLET | Freq: Every day | ORAL | Status: DC
  Administered 2014-01-05 – 2014-01-06 (×2): 100 mg via ORAL
  Filled 2014-01-05 (×2): qty 1

## 2014-01-05 MED ORDER — ALPRAZOLAM 0.25 MG PO TABS
0.2500 mg | ORAL_TABLET | Freq: Two times a day (BID) | ORAL | Status: DC
  Administered 2014-01-05 – 2014-01-06 (×3): 0.25 mg via ORAL
  Filled 2014-01-05 (×3): qty 1

## 2014-01-05 MED ORDER — SPIRONOLACTONE 12.5 MG HALF TABLET
12.5000 mg | ORAL_TABLET | Freq: Every day | ORAL | Status: DC
  Filled 2014-01-05: qty 1

## 2014-01-05 NOTE — Progress Notes (Signed)
  Pt arrive to unit in no s/s of distress via stretcher.  Pt alert and oriented x 2 to self and place. Pt oriented to unit and room. Difficult to get pt to respond to questions. Whiteboard updated. VS stable. Some confusion noted. Report received from previous nurse prior to pt's arrival. Call bell within reach. Will continue to monitor. Admitting doctor paged.

## 2014-01-05 NOTE — H&P (Signed)
PATIENT DETAILS Name: Katelyn Stevenson Age: 58 y.o. Sex: female Date of Birth: 1956-05-12 Admit Date: 01/04/2014 Admitting Physician Lynden Oxford, MD [id number] A, MD  Subjective: Much more alert than yesterday, husband at bedside-currently close to usual baseline.  Assessment/Plan: Principal Problem: Acute Encephalopathy -?cause-?drug withdrawal-not taking her Benzo's and Narcotics for more than 2 weeks -CT head/MRI Brain neg.No foci of infection apparent, neck is supple. Has hx of cirrhoses-but NH3 level normal -improved spontaneously with supportive care-suspect close to usual baseline -restart Benzo's and narcotics  Active Problems: ?Fall -PT eval, check orthostatics -CT head/MRI Brain neg  Hx of DVT -c/w Xarelto -no leg swelling seen on exam  Hx of cirrhoses -restart diuretics -c/w Lactulose and Rifaximin  Hx of DM -CBG's stable -c/w SSI, resume Lantus in pm-will reduce dose  Disposition: Remain inpatient  DVT Prophylaxis: Not needed as on Xarelto  Code Status: Full code   Family Communication Husband at bedside  Procedures:  None  CONSULTS:  None  Time spent 40 minutes-which includes 50% of the time with face-to-face with patient/ family and coordinating care related to the above assessment and plan.    MEDICATIONS: Scheduled Meds: . ALPRAZolam  0.25 mg Oral BID  . folic acid  1 mg Oral Daily  . insulin aspart  0-5 Units Subcutaneous QHS  . insulin aspart  0-9 Units Subcutaneous TID WC  . pantoprazole  40 mg Oral Daily  . rifaximin  550 mg Oral BID  . rivaroxaban  20 mg Oral Q supper  . sodium chloride  3 mL Intravenous Q12H  . thiamine  100 mg Oral Daily   Continuous Infusions:  PRN Meds:.ondansetron (ZOFRAN) IV, ondansetron, oxyCODONE  Antibiotics: Anti-infectives   Start     Dose/Rate Route Frequency Ordered Stop   01/05/14 1000  rifaximin (XIFAXAN) tablet 550 mg     550 mg Oral 2 times daily 01/04/14 2355          PHYSICAL EXAM: Vital signs in last 24 hours: Filed Vitals:   01/04/14 2301 01/04/14 2343 01/05/14 0422 01/05/14 1427  BP: 117/80 103/70 96/61 104/69  Pulse:  75 80 78  Temp:  98.4 F (36.9 C) 98.7 F (37.1 C) 98.7 F (37.1 C)  TempSrc:  Oral Oral Oral  Resp:   20 20  Height:  5\' 6"  (1.676 m)    Weight:  104.1 kg (229 lb 8 oz) 104 kg (229 lb 4.5 oz)   SpO2:  97% 98% 95%    Weight change:  Filed Weights   01/04/14 2343 01/05/14 0422  Weight: 104.1 kg (229 lb 8 oz) 104 kg (229 lb 4.5 oz)   Body mass index is 37.02 kg/(m^2).   Gen Exam: Awake and mostly alert with clear speech.   Neck: Supple, No JVD.   Chest: B/L Clear.   CVS: S1 S2 Regular, no murmurs.  Abdomen: soft, BS +, non tender, non distended. Extremities: no edema, lower extremities warm to touch. Neurologic: Non Focal.  Skin: No Rash.   Wounds: N/A.    Intake/Output from previous day:  Intake/Output Summary (Last 24 hours) at 01/05/14 1524 Last data filed at 01/05/14 0745  Gross per 24 hour  Intake   1515 ml  Output      0 ml  Net   1515 ml     LAB RESULTS: CBC  Recent Labs Lab 01/04/14 1920  WBC 10.1  HGB 16.5*  HCT 45.3  PLT 166  MCV  89.3  MCH 32.5  MCHC 36.4*  RDW 12.8  LYMPHSABS 2.2  MONOABS 0.7  EOSABS 0.1  BASOSABS 0.0    Chemistries   Recent Labs Lab 01/04/14 1920 01/05/14 1111  NA 131* 131*  K 3.4* 4.4  CL 92* 98  CO2 23 22  GLUCOSE 126* 152*  BUN 21 17  CREATININE 0.95 0.90  CALCIUM 10.4 10.0    CBG:  Recent Labs Lab 01/05/14 0916 01/05/14 1151  GLUCAP 150* 142*    GFR Estimated Creatinine Clearance: 83 ml/min (by C-G formula based on Cr of 0.9).  Coagulation profile  Recent Labs Lab 01/04/14 1920  INR 1.53*    Cardiac Enzymes  Recent Labs Lab 01/04/14 1920  TROPONINI <0.30    No components found with this basename: POCBNP,  No results found for this basename: DDIMER,  in the last 72 hours No results found for this basename: HGBA1C,   in the last 72 hours No results found for this basename: CHOL, HDL, LDLCALC, TRIG, CHOLHDL, LDLDIRECT,  in the last 72 hours No results found for this basename: TSH, T4TOTAL, FREET3, T3FREE, THYROIDAB,  in the last 72 hours No results found for this basename: VITAMINB12, FOLATE, FERRITIN, TIBC, IRON, RETICCTPCT,  in the last 72 hours No results found for this basename: LIPASE, AMYLASE,  in the last 72 hours  Urine Studies No results found for this basename: UACOL, UAPR, USPG, UPH, UTP, UGL, UKET, UBIL, UHGB, UNIT, UROB, ULEU, UEPI, UWBC, URBC, UBAC, CAST, CRYS, UCOM, BILUA,  in the last 72 hours  MICROBIOLOGY: Recent Results (from the past 240 hour(s))  MRSA PCR SCREENING     Status: None   Collection Time    01/04/14 11:51 PM      Result Value Ref Range Status   MRSA by PCR NEGATIVE  NEGATIVE Final   Comment:            The GeneXpert MRSA Assay (FDA     approved for NASAL specimens     only), is one component of a     comprehensive MRSA colonization     surveillance program. It is not     intended to diagnose MRSA     infection nor to guide or     monitor treatment for     MRSA infections.    RADIOLOGY STUDIES/RESULTS: Dg Chest 2 View  01/04/2014   CLINICAL DATA:  Fall, confusion  EXAM: CHEST  2 VIEW  COMPARISON:  03/04/2013  FINDINGS: The heart size and mediastinal contours are within normal limits. Both lungs are clear. The visualized skeletal structures are unremarkable.  IMPRESSION: No active cardiopulmonary disease.   Electronically Signed   By: Charlett Nose M.D.   On: 01/04/2014 20:04   Ct Head Wo Contrast  01/04/2014   CLINICAL DATA:  Confusion, fall, found of bottom of stairs, history diabetes, hypertension  EXAM: CT HEAD WITHOUT CONTRAST  CT CERVICAL SPINE WITHOUT CONTRAST  TECHNIQUE: Multidetector CT imaging of the head and cervical spine was performed following the standard protocol without intravenous contrast. Multiplanar CT image reconstructions of the cervical spine  were also generated.  COMPARISON:  03/04/2013  FINDINGS: CT HEAD FINDINGS  Minimal atrophy.  Normal ventricular morphology.  No midline shift or mass effect.  Otherwise normal appearance brain parenchyma.  No intracranial hemorrhage, mass lesion or evidence acute infarction.  No extra-axial fluid collections.  Bones and sinuses unremarkable.  CT CERVICAL SPINE FINDINGS  Visualized skullbase intact.  Osseous mineralization normal.  Prevertebral  soft tissues normal thickness.  Disc space narrowing at C5-C6 and C6-C7.  Vertebral body heights maintained without fracture or subluxation.  Minimal scattered facet degenerative changes.  Old healed fracture posterior RIGHT first rib.  Lung apices demonstrate minimal emphysematous changes but are otherwise clear.  IMPRESSION: No acute intracranial abnormalities.  No acute cervical spine abnormalities.  Minimal degenerative disc and facet disease changes of the cervical spine.   Electronically Signed   By: Ulyses SouthwardMark  Boles M.D.   On: 01/04/2014 19:47   Ct Cervical Spine Wo Contrast  01/04/2014   CLINICAL DATA:  Confusion, fall, found of bottom of stairs, history diabetes, hypertension  EXAM: CT HEAD WITHOUT CONTRAST  CT CERVICAL SPINE WITHOUT CONTRAST  TECHNIQUE: Multidetector CT imaging of the head and cervical spine was performed following the standard protocol without intravenous contrast. Multiplanar CT image reconstructions of the cervical spine were also generated.  COMPARISON:  03/04/2013  FINDINGS: CT HEAD FINDINGS  Minimal atrophy.  Normal ventricular morphology.  No midline shift or mass effect.  Otherwise normal appearance brain parenchyma.  No intracranial hemorrhage, mass lesion or evidence acute infarction.  No extra-axial fluid collections.  Bones and sinuses unremarkable.  CT CERVICAL SPINE FINDINGS  Visualized skullbase intact.  Osseous mineralization normal.  Prevertebral soft tissues normal thickness.  Disc space narrowing at C5-C6 and C6-C7.  Vertebral body  heights maintained without fracture or subluxation.  Minimal scattered facet degenerative changes.  Old healed fracture posterior RIGHT first rib.  Lung apices demonstrate minimal emphysematous changes but are otherwise clear.  IMPRESSION: No acute intracranial abnormalities.  No acute cervical spine abnormalities.  Minimal degenerative disc and facet disease changes of the cervical spine.   Electronically Signed   By: Ulyses SouthwardMark  Boles M.D.   On: 01/04/2014 19:47   Mr Brain Wo Contrast  01/05/2014   CLINICAL DATA:  Acute confusion.  Right medial rectus palsy.  EXAM: MRI HEAD WITHOUT CONTRAST  TECHNIQUE: Multiplanar, multiecho pulse sequences of the brain and surrounding structures were obtained without intravenous contrast.  COMPARISON:  Head CT 01/04/2014  FINDINGS: Images are mildly degraded by motion artifact.  There is no evidence of acute infarct, intracranial hemorrhage, mass, midline shift, or extra-axial fluid collection. There is mild generalized cerebral atrophy, within normal limits for age. There is no significant white matter disease. There is the suggestion of symmetric, mildly increased T2 signal intensity in the globi pallidi.  Orbits are unremarkable. Small right maxillary sinus mucous retention cyst is noted. Major intracranial vascular flow voids are preserved. Trace right mastoid fluid is noted.  IMPRESSION: 1. No acute infarct or mass. 2. Subtly increased, symmetric T2 signal in the globi pallidi, nonspecific but can be seen in both metabolic and neurodegenerative disorders.   Electronically Signed   By: Sebastian AcheAllen  Grady   On: 01/05/2014 11:39    Jeoffrey MassedGHIMIRE,SHANKER, MD  [facility] Pager:[phone]  If 7PM-7AM, please contact night-coverage [email or web address] Password [email or web address] 01/05/2014, 3:24 PM   LOS: 1 day   **Disclaimer: This note may have been dictated with voice recognition software. Similar sounding words can inadvertently be transcribed and this note may contain transcription errors  which may not have been corrected upon publication of note.**

## 2014-01-05 NOTE — Progress Notes (Signed)
ANTICOAGULATION CONSULT NOTE - Initial Consult  Pharmacy Consult for Xarelto Indication:History of  DVT  Allergies  Allergen Reactions  . Penicillins Anaphylaxis and Swelling  . Codeine Itching    Benadryl is given to combat symptoms    Patient Measurements: Height: 5\' 6"  (167.6 cm) Weight: 229 lb 8 oz (104.1 kg) IBW/kg (Calculated) : 59.3  Vital Signs: Temp: 98.4 F (36.9 C) (08/15 2343) Temp src: Oral (08/15 2343) BP: 103/70 mmHg (08/15 2343) Pulse Rate: 75 (08/15 2343)  Labs:  Recent Labs  01/04/14 1920  HGB 16.5*  HCT 45.3  PLT 166  LABPROT 18.4*  INR 1.53*  CREATININE 0.95  TROPONINI <0.30    Estimated Creatinine Clearance: 78.7 ml/min (by C-G formula based on Cr of 0.95).   Medical History: Past Medical History  Diagnosis Date  . Diabetes mellitus   . Hypertension   . Anxiety   . Chronic pain   . Fibromyalgia   . Chronic neck pain   . Chronic back pain   . Hemochromatosis   . Alcoholic hepatitis   . Hepatic encephalopathy   . Thrombocytopenia     Medications:  Home medication list is pending - pharmacy to call patients pharmacy in am when open  Assessment: 58 year old female admitted s/p fall with confusion. CT was negative for any bleeding/hemorrhages. Patient has history of DVT and appears to have received xarelto samples from doctors office, will restart xarelto now. No dose adjustments needed, cbc normal.  Goal of Therapy:  Therapeutic anticoagulation Monitor platelets by anticoagulation protocol: Yes   Plan:  Xarelto 20mg  daily - first dose now  Sheppard CoilFrank Ashleynicole Mcclees PharmD., BCPS Clinical Pharmacist Pager [phone] 01/05/2014 12:43 AM

## 2014-01-05 NOTE — Progress Notes (Signed)
Utilization Review Completed.Burwell Bethel T8/16/2015  

## 2014-01-06 ENCOUNTER — Observation Stay (HOSPITAL_COMMUNITY): Payer: BC Managed Care – PPO

## 2014-01-06 LAB — GLUCOSE, CAPILLARY
Glucose-Capillary: 114 mg/dL — ABNORMAL HIGH (ref 70–99)
Glucose-Capillary: 74 mg/dL (ref 70–99)

## 2014-01-06 MED ORDER — ALPRAZOLAM 0.25 MG PO TABS: 0.2500 mg | ORAL_TABLET | Freq: Two times a day (BID) | ORAL | Status: DC

## 2014-01-06 MED ORDER — OXYCODONE HCL 10 MG PO TABS: 10.0000 mg | ORAL_TABLET | Freq: Four times a day (QID) | ORAL | Status: DC | PRN

## 2014-01-06 MED ORDER — INSULIN GLARGINE 100 UNIT/ML ~~LOC~~ SOLN: 23.0000 [IU] | Freq: Every day | SUBCUTANEOUS | Status: DC

## 2014-01-06 NOTE — Progress Notes (Signed)
EEG completed; results pending.    

## 2014-01-06 NOTE — Evaluation (Signed)
Physical Therapy Evaluation Patient Details Name: Katelyn BailiffJan E Stevenson MRN: [id number] DOB: March 05, 1956 Today's Date: 01/06/2014   History of Present Illness  Patient is a 58 y/o female admitted with confusion s/p fall down the stairs at home diagnosed with acute encephalopathy. PMH significant for DM, HTN, fibromyalgia, anxiety, alcoholic abuse, liver disease with cirrhosis, DVT 04/2013 on anticoagulation and falls.    Clinical Impression  Patient presents with functional limitations due to deficits listed in PT problem list (see below). Pt with balance deficits limiting safe mobility. Pt declined using RW for support during gait training today, which will assist with safety and balance. Pt with fall history and multiple steps to climb at home to get to kitchen/bedroom etc. Pt would benefit from acute PT and HHPT at discharge to improve safe mobility and maximize independence. Pt declining HHPT at this time, "let's save it for later."     Follow Up Recommendations Home health PT;Supervision for mobility/OOB (Pt reluctant to have HHPT, "let's just save it for later.")    Equipment Recommendations  None recommended by PT    Recommendations for Other Services OT consult     Precautions / Restrictions Precautions Precautions: Fall Restrictions Weight Bearing Restrictions: No      Mobility  Bed Mobility Overal bed mobility: Needs Assistance;Modified Independent Bed Mobility: Supine to Sit     Supine to sit: Modified independent (Device/Increase time)     General bed mobility comments: Pt long sitting in bed upon arrival. Use of rails for assist.  Transfers Overall transfer level: Needs assistance Equipment used: None Transfers: Sit to/from UGI CorporationStand;Stand Pivot Transfers Sit to Stand: Min guard Stand pivot transfers: Min guard       General transfer comment: Stood from EOB x1. Mildly unsteady upon standing. Min guard for safety. Ambulated and transferred to  chair.  Ambulation/Gait Ambulation/Gait assistance: Min guard Ambulation Distance (Feet): 100 Feet (+16' with seated rest break.) Assistive device: None Gait Pattern/deviations: Antalgic;Step-through pattern;Trunk flexed;Drifts right/left;Wide base of support;Decreased stance time - left Gait velocity: 1.3 ft/sec Gait velocity interpretation: <1.8 ft/sec, indicative of risk for recurrent falls General Gait Details: Pt unsteady with occasional drifting right/left, no arm swing during gait as pt holding UEs close to chest. Min guard for safety. Pt limited ambulation distance.  Stairs            Wheelchair Mobility    Modified Rankin (Stroke Patients Only)       Balance Overall balance assessment: Needs assistance   Sitting balance-Leahy Scale: Good     Standing balance support: During functional activity Standing balance-Leahy Scale: Fair Standing balance comment: Unsteady during dynamic standing and gait. Able to reach forward and scratch RLE on a few occasions without LOB anteriorly.                             Pertinent Vitals/Pain Pain Assessment: No/denies pain    Home Living Family/patient expects to be discharged to:: Private residence Living Arrangements: Spouse/significant other Available Help at Discharge: Family;Available PRN/intermittently (Husband works.) Type of Home: House Home Access: Level entry     Home Layout: Multi-level Home Equipment: Environmental consultantWalker - 2 wheels      Prior Function Level of Independence: Independent with assistive device(s)         Comments: Pt reports using RW PRN for mobility. Reports (I) with ADLs, does sponge baths. Does some cooking/cleaning. Husband drives. Reports multiple falls at home.     Hand Dominance  Extremity/Trunk Assessment   Upper Extremity Assessment: Overall WFL for tasks assessed           Lower Extremity Assessment: Overall WFL for tasks assessed;LLE deficits/detail   LLE  Deficits / Details: Numbness reported in left foot, "ever since the DVT" per pt report. Grossly ~4+/5 knee extension.     Communication   Communication: No difficulties  Cognition Arousal/Alertness: Awake/alert Behavior During Therapy: WFL for tasks assessed/performed Overall Cognitive Status: Within Functional Limits for tasks assessed                      General Comments General comments (skin integrity, edema, etc.): Discoloration of skin BLEs distal to knee. Right eye deviated to right.    Exercises        Assessment/Plan    PT Assessment Patient needs continued PT services  PT Diagnosis Difficulty walking   PT Problem List Decreased activity tolerance;Decreased balance;Decreased safety awareness;Decreased mobility  PT Treatment Interventions DME instruction;Balance training;Gait training;Stair training;Patient/family education;Functional mobility training;Therapeutic activities;Therapeutic exercise;Cognitive remediation   PT Goals (Current goals can be found in the Care Plan section) Acute Rehab PT Goals Patient Stated Goal: to get back to swimming. PT Goal Formulation: With patient Time For Goal Achievement: 01/20/14 Potential to Achieve Goals: Fair    Frequency Min 3X/week   Barriers to discharge Decreased caregiver support Pt is home alone during the day as husband works.    Co-evaluation               End of Session Equipment Utilized During Treatment: Gait belt Activity Tolerance: Patient limited by fatigue Patient left: in chair;with nursing/sitter in room (Pt left in w/c to go down for test with transporter.) Nurse Communication: Mobility status    Functional Assessment Tool Used: Clinical judgment. Functional Limitation: Mobility: Walking and moving around Mobility: Walking and Moving Around Current Status [phone]): At least 1 percent but less than 20 percent impaired, limited or restricted Mobility: Walking and Moving Around Goal Status  [phone]): At least 1 percent but less than 20 percent impaired, limited or restricted    Time: 3086-5784 PT Time Calculation (min): 17 min   Charges:   PT Evaluation $Initial PT Evaluation Tier I: 1 Procedure PT Treatments $Therapeutic Activity: 8-22 mins   PT G Codes:   Functional Assessment Tool Used: Clinical judgment. Functional Limitation: Mobility: Walking and moving around    [place], [place] A 01/06/2014, 10:18 AM Alvie Heidelberg, PT, DPT [phone]

## 2014-01-06 NOTE — Progress Notes (Signed)
Katelyn BailiffJan E Stevenson to be D/C'd Home per MD order.  Discussed with the patient and all questions fully answered.    Medication List    STOP taking these medications       insulin glargine 100 unit/mL Sopn  Commonly known as:  LANTUS  Replaced by:  insulin glargine 100 UNIT/ML injection      TAKE these medications       ALPRAZolam 0.25 MG tablet  Commonly known as:  XANAX  Take 1 tablet (0.25 mg total) by mouth 2 (two) times daily.     B-1 PO  Take 1 tablet by mouth daily.     furosemide 40 MG tablet  Commonly known as:  LASIX  Take 40 mg by mouth daily.     insulin glargine 100 UNIT/ML injection  Commonly known as:  LANTUS  Inject 0.23 mLs (23 Units total) into the skin at bedtime.     lactulose 10 GM/15ML solution  Commonly known as:  CHRONULAC  Take 30 g by mouth 3 (three) times daily.     LYRICA 50 MG capsule  Generic drug:  pregabalin  Take 50 mg by mouth every 8 (eight) hours.     omeprazole 20 MG capsule  Commonly known as:  PRILOSEC  Take 20 mg by mouth daily.     Oxycodone HCl 10 MG Tabs  Commonly known as:  ROXICODONE  Take 1 tablet (10 mg total) by mouth every 6 (six) hours as needed for severe pain.     rifaximin 550 MG Tabs tablet  Commonly known as:  XIFAXAN  Take 550 mg by mouth 2 (two) times daily.     rivaroxaban 20 MG Tabs tablet  Commonly known as:  XARELTO  Take 20 mg by mouth daily.     spironolactone 100 MG tablet  Commonly known as:  ALDACTONE  Take 100 mg by mouth daily.        VVS, Skin clean, dry and intact without evidence of skin break down, no evidence of skin tears noted. IV catheter discontinued intact. Site without signs and symptoms of complications. Dressing and pressure applied.  An After Visit Summary was printed and given to the patient.  D/c education completed with patient/family including follow up instructions, medication list, d/c activities limitations if indicated, with other d/c instructions as indicated by MD -  patient able to verbalize understanding, all questions fully answered.   Patient instructed to return to ED, call 911, or call MD for any changes in condition.   Patient escorted via WC, and D/C home via private auto.  Katelyn Stevenson, Katelyn Stevenson 01/06/2014 12:58 PM

## 2014-01-06 NOTE — Procedures (Signed)
ELECTROENCEPHALOGRAM REPORT  Date of Study: 01/06/2014  Patient's Name: Katelyn Stevenson MRN: [id number] Date of Birth: August 26, 1955  Referring Provider: Dr. Jeoffrey MassedShanker Ghimire  Clinical History: This is a 58 year old woman admitted for confusion and fall.  Medications: Xanax, folic acid, Lasix,insulin, Roxicodone, Protonix, Rifaximin, Xarelto, spironolactone, thiamine  Technical Summary: A multichannel digital EEG recording measured by the international 10-20 system with electrodes applied with paste and impedances below 5000 ohms performed in our laboratory with EKG monitoring in an awake and drowsy patient.  Hyperventilation and photic stimulation were performed.  The digital EEG was referentially recorded, reformatted, and digitally filtered in a variety of bipolar and referential montages for optimal display.    Description: The patient is awake and drowsy during the recording.  During maximal wakefulness, there is a symmetric, medium voltage 10 Hz posterior dominant rhythm that attenuates with eye opening.  The record is symmetric.  During drowsiness, there is a slight increase in theta slowing of the background.  Hyperventilation and photic stimulation did not elicit any abnormalities.  There were no epileptiform discharges or electrographic seizures seen.    EKG lead was unremarkable.  Impression: This awake and drowsy EEG is normal.    Clinical Correlation: A normal EEG does not exclude a clinical diagnosis of epilepsy. Clinical correlation is advised.   Patrcia DollyKaren Samirah Scarpati, M.D.

## 2014-01-06 NOTE — Discharge Summary (Signed)
PATIENT DETAILS Name: Katelyn Stevenson Age: 58 y.o. Sex: female Date of Birth: 1955-10-12 MRN: [id number]. Admit Date: 01/04/2014 Admitting Physician: Lynden Oxford, MD YQM:VHQIONG,EXBMW A, MD  Recommendations for Outpatient Follow-up:  1. Restarted on Benzos and narcotics (at a lower dose).  Needs to be managed by PCP 2. Continue following up with PCP for management of diabetes and liver cirrhosis.  Mildly hyponatremic 3. CBC / BMET at follow up.  PRIMARY DISCHARGE DIAGNOSIS:  Principal Problem:   Altered mental status Active Problems:   Acute confusional state   Hypertension   Fibromyalgia   Hemochromatosis   Alcoholic hepatitis   DVT (deep venous thrombosis)      PAST MEDICAL HISTORY: Past Medical History  Diagnosis Date  . Diabetes mellitus   . Hypertension   . Anxiety   . Chronic pain   . Fibromyalgia   . Chronic neck pain   . Chronic back pain   . Hemochromatosis   . Alcoholic hepatitis   . Hepatic encephalopathy   . Thrombocytopenia   . Thrombus DVT    DISCHARGE MEDICATIONS:   Medication List    STOP taking these medications       insulin glargine 100 unit/mL Sopn  Commonly known as:  LANTUS  Replaced by:  insulin glargine 100 UNIT/ML injection      TAKE these medications       ALPRAZolam 0.25 MG tablet  Commonly known as:  XANAX  Take 1 tablet (0.25 mg total) by mouth 2 (two) times daily.     B-1 PO  Take 1 tablet by mouth daily.     furosemide 40 MG tablet  Commonly known as:  LASIX  Take 40 mg by mouth daily.     insulin glargine 100 UNIT/ML injection  Commonly known as:  LANTUS  Inject 0.23 mLs (23 Units total) into the skin at bedtime.     lactulose 10 GM/15ML solution  Commonly known as:  CHRONULAC  Take 30 g by mouth 3 (three) times daily.     LYRICA 50 MG capsule  Generic drug:  pregabalin  Take 50 mg by mouth every 8 (eight) hours.     omeprazole 20 MG capsule  Commonly known as:  PRILOSEC  Take 20 mg by mouth daily.       Oxycodone HCl 10 MG Tabs  Commonly known as:  ROXICODONE  Take 1 tablet (10 mg total) by mouth every 6 (six) hours as needed for severe pain.     rifaximin 550 MG Tabs tablet  Commonly known as:  XIFAXAN  Take 550 mg by mouth 2 (two) times daily.     rivaroxaban 20 MG Tabs tablet  Commonly known as:  XARELTO  Take 20 mg by mouth daily.     spironolactone 100 MG tablet  Commonly known as:  ALDACTONE  Take 100 mg by mouth daily.        ALLERGIES:   Allergies  Allergen Reactions  . Penicillins Anaphylaxis and Swelling  . Codeine Itching    Benadryl is given to combat symptoms    BRIEF HPI:  See H&P, Labs, Consult and Test reports for all details.  In brief, patient is a 58 y.o. 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 Patient was found at the bottom of the stairs after  a fall, but was unable to define the reason for her fall.  Denied being dizzy, lightheaded, passing out, or tripping over something. EMS arrived at the scene and called the patient's husband.  Patient was described by EMS as being confused, talking to herself, and talking out of context.  Husband stated that the patient had been out of her pain and anxiety medications for 2 weeks and was unable to fill them.  Patient is independent fo rmost of her ADLs.  Patient denied complaints of fever, headaches, chest pain, SOB, PND, N/V/D, and any urinary symptoms.   CONSULTATIONS:   None  PERTINENT RADIOLOGIC STUDIES: Dg Chest 2 View  01/04/2014   CLINICAL DATA:  Fall, confusion  EXAM: CHEST  2 VIEW  COMPARISON:  03/04/2013  FINDINGS: The heart size and mediastinal contours are within normal limits. Both lungs are clear. The visualized skeletal structures are unremarkable.  IMPRESSION: No active cardiopulmonary  disease.   Electronically Signed   By: Charlett Nose M.D.   On: 01/04/2014 20:04   Ct Head Wo Contrast  01/04/2014   CLINICAL DATA:  Confusion, fall, found of bottom of stairs, history diabetes, hypertension  EXAM: CT HEAD WITHOUT CONTRAST  CT CERVICAL SPINE WITHOUT CONTRAST  TECHNIQUE: Multidetector CT imaging of the head and cervical spine was performed following the standard protocol without intravenous contrast. Multiplanar CT image reconstructions of the cervical spine were also generated.  COMPARISON:  03/04/2013  FINDINGS: CT HEAD FINDINGS  Minimal atrophy.  Normal ventricular morphology.  No midline shift or mass effect.  Otherwise normal appearance brain parenchyma.  No intracranial hemorrhage, mass lesion or evidence acute infarction.  No extra-axial fluid collections.  Bones and sinuses unremarkable.  CT CERVICAL SPINE FINDINGS  Visualized skullbase intact.  Osseous mineralization normal.  Prevertebral soft tissues normal thickness.  Disc space narrowing at C5-C6 and C6-C7.  Vertebral body heights maintained without fracture or subluxation.  Minimal scattered facet degenerative changes.  Old healed fracture posterior RIGHT first rib.  Lung apices demonstrate minimal emphysematous changes but are otherwise clear.  IMPRESSION: No acute intracranial abnormalities.  No acute cervical spine abnormalities.  Minimal degenerative disc and facet disease changes of the cervical spine.   Electronically Signed   By: Ulyses Southward M.D.   On: 01/04/2014 19:47   Ct Cervical Spine Wo Contrast  01/04/2014   CLINICAL DATA:  Confusion, fall, found of bottom of stairs, history diabetes, hypertension  EXAM: CT HEAD WITHOUT CONTRAST  CT CERVICAL SPINE WITHOUT CONTRAST  TECHNIQUE: Multidetector CT imaging of the head and cervical spine was performed following the standard protocol without intravenous contrast. Multiplanar CT image reconstructions of the cervical spine were also generated.  COMPARISON:  03/04/2013  FINDINGS:  CT HEAD FINDINGS  Minimal atrophy.  Normal ventricular morphology.  No midline shift or mass effect.  Otherwise normal appearance brain parenchyma.  No intracranial hemorrhage, mass lesion or evidence acute infarction.  No extra-axial fluid collections.  Bones and sinuses unremarkable.  CT CERVICAL SPINE FINDINGS  Visualized skullbase intact.  Osseous mineralization normal.  Prevertebral soft tissues normal thickness.  Disc space narrowing at C5-C6 and C6-C7.  Vertebral body heights maintained without fracture or subluxation.  Minimal scattered facet degenerative changes.  Old healed fracture posterior RIGHT first rib.  Lung apices demonstrate minimal emphysematous changes but are otherwise clear.  IMPRESSION: No acute intracranial abnormalities.  No acute cervical spine abnormalities.  Minimal degenerative disc and facet disease changes of the cervical spine.   Electronically Signed  By: Ulyses SouthwardMark  Boles M.D.   On: 01/04/2014 19:47   Mr Brain Wo Contrast  01/05/2014   CLINICAL DATA:  Acute confusion.  Right medial rectus palsy.  EXAM: MRI HEAD WITHOUT CONTRAST  TECHNIQUE: Multiplanar, multiecho pulse sequences of the brain and surrounding structures were obtained without intravenous contrast.  COMPARISON:  Head CT 01/04/2014  FINDINGS: Images are mildly degraded by motion artifact.  There is no evidence of acute infarct, intracranial hemorrhage, mass, midline shift, or extra-axial fluid collection. There is mild generalized cerebral atrophy, within normal limits for age. There is no significant white matter disease. There is the suggestion of symmetric, mildly increased T2 signal intensity in the globi pallidi.  Orbits are unremarkable. Small right maxillary sinus mucous retention cyst is noted. Major intracranial vascular flow voids are preserved. Trace right mastoid fluid is noted.  IMPRESSION: 1. No acute infarct or mass. 2. Subtly increased, symmetric T2 signal in the globi pallidi, nonspecific but can be seen  in both metabolic and neurodegenerative disorders.   Electronically Signed   By: Sebastian AcheAllen  Grady   On: 01/05/2014 11:39     PERTINENT LAB RESULTS: CBC:  Recent Labs  01/04/14 1920  WBC 10.1  HGB 16.5*  HCT 45.3  PLT 166   CMET CMP     Component Value Date/Time   NA 131* 01/05/2014 1111   K 4.4 01/05/2014 1111   CL 98 01/05/2014 1111   CO2 22 01/05/2014 1111   GLUCOSE 152* 01/05/2014 1111   BUN 17 01/05/2014 1111   CREATININE 0.90 01/05/2014 1111   CALCIUM 10.0 01/05/2014 1111   PROT 7.2 01/05/2014 1111   ALBUMIN 3.8 01/05/2014 1111   AST 17 01/05/2014 1111   ALT 14 01/05/2014 1111   ALKPHOS 82 01/05/2014 1111   BILITOT 1.0 01/05/2014 1111   GFRNONAA 69* 01/05/2014 1111   GFRAA 80* 01/05/2014 1111    GFR Estimated Creatinine Clearance: 94.9 ml/min (by C-G formula based on Cr of 0.9).  Recent Labs  01/04/14 1920  TROPONINI <0.30     Recent Labs  01/04/14 1920  INR 1.53*   Microbiology: Recent Results (from the past 240 hour(s))  URINE CULTURE     Status: None   Collection Time    01/04/14  8:09 PM      Result Value Ref Range Status   Specimen Description URINE, RANDOM   Final   Special Requests NONE   Final   Culture  Setup Time     Final   Value: 01/05/2014 03:29     Performed at [facility]   Colony Count     Final   Value: >=100,000 COLONIES/ML     Performed at [facility]   Culture     Final   Value: ESCHERICHIA COLI     Performed at [facility]   Report Status PENDING   Incomplete  MRSA PCR SCREENING     Status: None   Collection Time    01/04/14 11:51 PM      Result Value Ref Range Status   MRSA by PCR NEGATIVE  NEGATIVE Final   Comment:            The GeneXpert MRSA Assay (FDA     approved for NASAL specimens     only), is one component of a     comprehensive MRSA colonization     surveillance program. It is not     intended to diagnose MRSA     infection  nor to guide or     monitor treatment for     MRSA infections.      BRIEF HOSPITAL COURSE:  Principal Problem:   Acute Encephalopathy  -ED eval done that included urine drug screen, urine culture, urine analysis, tylenol level, ammonia level, lactic acid, troponin, proBNP, CT head and C-spine.  All did not show any acute abnormalities. -CT head/MRI Brain was done and was negative. EEG negative as well -No foci of infection apparent, neck was supple. Has hx of cirrhoses-but NH3 level were normal  -?cause? drug withdrawal- had not been taking her Benzo's and Narcotics for more than 2 weeks  -improved spontaneously with supportive care- back to usual baseline  -was restarted on Benzo's and narcotics   Active Problems:   Fall  -Did not remember what precipitated the fall -PT eval was ordered and orthostatics were checked. HHPT ordered -CT head/MRI Brain done and were negative  Hx of DVT  -no leg swelling was seen on exam  -was continued on Xarelto   Hx of cirrhoses  -was restarted on diuretics  -Lactulose and Rifaximin were continued  Hx of DM  -CBG's were stable  -was started on SSI,  -resumed Lantus in pm of 08/17 but with reduced dose   TODAY-DAY OF DISCHARGE:  Subjective:   Katelyn Stevenson today has no headache, no chest or abdominal pain, no new weakness, tingling or numbness, and feels much better wants to go home today. States she does not feel confused and feels "her normal self."  Still not able to remember what caused her to fall.  Her appetite is fine and ambulating without any problems.  No urinary complaints, N/V/D.  Last BM was yesterday and it was normal for her.    Objective:   Blood pressure 105/71, pulse 64, temperature 98.2 F (36.8 C), temperature source Oral, resp. rate 20, height 5\' 6"  (1.676 m), weight 131.452 kg (289 lb 12.8 oz), SpO2 96.00%.  Intake/Output Summary (Last 24 hours) at 01/06/14 1217 Last data filed at 01/06/14 0900  Gross per 24 hour  Intake    222 ml  Output      0 ml  Net    222 ml   Filed  Weights   01/04/14 2343 01/05/14 0422 01/06/14 0421  Weight: 104.1 kg (229 lb 8 oz) 104 kg (229 lb 4.5 oz) 131.452 kg (289 lb 12.8 oz)    Exam General: Awake, Alert, Oriented *3, No new F.N deficits, Normal affect Eyes: [place].AT, PERRLA Neck: Supple, No JVD, No cervical lymphadenopathy appriciated.  Chest: Symmetrical chest wall movement, good air movement bilaterally, CTAB Heart: RRR, No gallops, rubs or new murmurs. No parasternal heave Abd: +ve B.Sounds, Abd Soft, Non tender, No organomegaly appriciated, No rebound -guarding or rigidity. Ext: No Cyanosis, Clubbing or edema, No new Rash or bruise Neurologic: Grossly intact, no focal deficits  DISCHARGE CONDITION: Stable  DISPOSITION: Home  DISCHARGE INSTRUCTIONS:    Activity:  As tolerated without any restrictions  Diet recommendation: Diabetic Diet Heart Healthy diet Discharge Instructions   Activity as tolerated - No restrictions    Complete by:  As directed      Call MD for:  difficulty breathing, headache or visual disturbances    Complete by:  As directed      Call MD for:  extreme fatigue    Complete by:  As directed      Call MD for:  persistant dizziness or light-headedness    Complete by:  As directed  Diet - low sodium heart healthy    Complete by:  As directed      Diet Carb Modified    Complete by:  As directed            Follow-up Information   Follow up with BURNETT,BRENT A, MD In 1 week. ( As needed, If symptoms worsen)    Specialty:  Family Medicine   Contact information:   [address] [place] [address] [phone]      Total Time spent on discharge equals 45 minutes.  Signed: Collene Leyden, PA-S, [address] [place], [place] - [facility] [phone] 01/06/2014 12:17 PM  **Disclaimer: This note may have been dictated with voice recognition software. Similar sounding words can inadvertently be transcribed and this note may contain transcription  errors which may not have been corrected upon publication of note.**  Attending Patient was seen, examined,treatment plan was discussed with the Physician extender. I have directly reviewed the clinical findings, lab, imaging studies and management of this patient in detail. I have made the necessary changes to the above noted documentation, and agree with the documentation, as recorded by the Physician extender.  Windell Norfolk MD [facility].

## 2014-01-06 NOTE — Care Management Note (Signed)
    Page 1 of 1   01/06/2014     12:36:33 PM CARE MANAGEMENT NOTE 01/06/2014  Patient:  Katelyn Stevenson,Katelyn Stevenson   Account Number:  [id number]  Date Initiated:  01/06/2014  Documentation initiated by:  Katelyn Stevenson,Katelyn Stevenson  Subjective/Objective Assessment:   dx ams, cirrohosis , hx bipolar  admit- lives with spouse.     Action/Plan:   pt eval- rec hhpt. patient refuses.   Anticipated DC Date:  01/06/2014   Anticipated DC Plan:  HOME W HOME HEALTH SERVICES      DC Planning Services  CM consult      [facility]   Choice offered to / List presented to:  C-1 Patient        HH arranged  HH-2 PT  HH - 11 Patient Refused      Status of service:  Completed, signed off Medicare Important Message given?  NO (If response is "NO", the following Medicare IM given date fields will be blank) Date Medicare IM given:   Medicare IM given by:   Date Additional Medicare IM given:   Additional Medicare IM given by:    Discharge Disposition:  HOME/SELF CARE  Per UR Regulation:  Reviewed for med. necessity/level of care/duration of stay  If discussed at Long Length of Stay Meetings, dates discussed:    Comments:  01/06/14 1235 Katelyn Capeeborah Lysa Livengood RN, BSN [phone] patient for dc today, [facility].

## 2014-01-07 LAB — URINE CULTURE: Colony Count: >=100000

## 2014-08-25 ENCOUNTER — Encounter (HOSPITAL_COMMUNITY): Payer: Self-pay | Admitting: *Deleted

## 2014-08-25 ENCOUNTER — Emergency Department (HOSPITAL_COMMUNITY)
Admission: EM | Admit: 2014-08-25 | Discharge: 2014-08-25 | Disposition: A | Discharge status: Home / Self Care | Payer: BLUE CROSS/BLUE SHIELD | Attending: Emergency Medicine | Admitting: Emergency Medicine

## 2014-08-25 ENCOUNTER — Emergency Department (HOSPITAL_COMMUNITY): Payer: BLUE CROSS/BLUE SHIELD

## 2014-08-25 DIAGNOSIS — I10 Essential (primary) hypertension: Secondary | ICD-10-CM | POA: Insufficient documentation

## 2014-08-25 DIAGNOSIS — Z8719 Personal history of other diseases of the digestive system: Secondary | ICD-10-CM | POA: Insufficient documentation

## 2014-08-25 DIAGNOSIS — F419 Anxiety disorder, unspecified: Secondary | ICD-10-CM | POA: Diagnosis not present

## 2014-08-25 DIAGNOSIS — R5383 Other fatigue: Secondary | ICD-10-CM | POA: Insufficient documentation

## 2014-08-25 DIAGNOSIS — E119 Type 2 diabetes mellitus without complications: Secondary | ICD-10-CM | POA: Insufficient documentation

## 2014-08-25 DIAGNOSIS — R5381 Other malaise: Secondary | ICD-10-CM

## 2014-08-25 DIAGNOSIS — Z862 Personal history of diseases of the blood and blood-forming organs and certain disorders involving the immune mechanism: Secondary | ICD-10-CM | POA: Insufficient documentation

## 2014-08-25 DIAGNOSIS — Z880 Allergy status to penicillin: Secondary | ICD-10-CM | POA: Diagnosis not present

## 2014-08-25 DIAGNOSIS — Z630 Problems in relationship with spouse or partner: Secondary | ICD-10-CM

## 2014-08-25 DIAGNOSIS — Z79899 Other long term (current) drug therapy: Secondary | ICD-10-CM | POA: Insufficient documentation

## 2014-08-25 DIAGNOSIS — Z794 Long term (current) use of insulin: Secondary | ICD-10-CM | POA: Insufficient documentation

## 2014-08-25 DIAGNOSIS — Z720 Tobacco use: Secondary | ICD-10-CM | POA: Insufficient documentation

## 2014-08-25 DIAGNOSIS — G8929 Other chronic pain: Secondary | ICD-10-CM | POA: Diagnosis not present

## 2014-08-25 LAB — COMPREHENSIVE METABOLIC PANEL
ALT: 19 U/L (ref 0–35)
AST: 35 U/L (ref 0–37)
Albumin: 4.9 g/dL (ref 3.5–5.2)
Alkaline Phosphatase: 76 U/L (ref 39–117)
Anion gap: 11 (ref 5–15)
BUN: 25 mg/dL — ABNORMAL HIGH (ref 6–23)
CO2: 24 mmol/L (ref 19–32)
Calcium: 9.8 mg/dL (ref 8.4–10.5)
Chloride: 101 mmol/L (ref 96–112)
Creatinine, Ser: 1.12 mg/dL — ABNORMAL HIGH (ref 0.50–1.10)
GFR calc Af Amer: 61 mL/min — ABNORMAL LOW (ref >90)
GFR calc non Af Amer: 53 mL/min — ABNORMAL LOW (ref >90)
Glucose, Bld: 160 mg/dL — ABNORMAL HIGH (ref 70–99)
Potassium: 4.2 mmol/L (ref 3.5–5.1)
Sodium: 136 mmol/L (ref 135–145)
Total Bilirubin: 1.1 mg/dL (ref 0.3–1.2)
Total Protein: 9.3 g/dL — ABNORMAL HIGH (ref 6.0–8.3)

## 2014-08-25 LAB — CBC WITH DIFFERENTIAL/PLATELET
Basophils Absolute: 0 10*3/uL (ref 0.0–0.1)
Basophils Relative: 0 % (ref 0–1)
Eosinophils Absolute: 0.2 10*3/uL (ref 0.0–0.7)
Eosinophils Relative: 2 % (ref 0–5)
HCT: 44.9 % (ref 36.0–46.0)
Hemoglobin: 15.2 g/dL — ABNORMAL HIGH (ref 12.0–15.0)
Lymphocytes Relative: 16 % (ref 12–46)
Lymphs Abs: 1.4 10*3/uL (ref 0.7–4.0)
MCH: 32.1 pg (ref 26.0–34.0)
MCHC: 33.9 g/dL (ref 30.0–36.0)
MCV: 94.7 fL (ref 78.0–100.0)
Monocytes Absolute: 0.5 10*3/uL (ref 0.1–1.0)
Monocytes Relative: 5 % (ref 3–12)
Neutro Abs: 7.2 10*3/uL (ref 1.7–7.7)
Neutrophils Relative %: 77 % (ref 43–77)
Platelets: 189 10*3/uL (ref 150–400)
RBC: 4.74 MIL/uL (ref 3.87–5.11)
RDW: 13.5 % (ref 11.5–15.5)
WBC: 9.3 10*3/uL (ref 4.0–10.5)

## 2014-08-25 LAB — AMMONIA: Ammonia: 12 umol/L (ref 11–32)

## 2014-08-25 LAB — I-STAT TROPONIN, ED: Troponin i, poc: 0 ng/mL (ref 0.00–0.08)

## 2014-08-25 LAB — ETHANOL: Alcohol, Ethyl (B): <5 mg/dL (ref 0–9)

## 2014-08-25 LAB — TSH: TSH: 0.694 u[IU]/mL (ref 0.350–4.500)

## 2014-08-25 LAB — LIPASE, BLOOD: Lipase: 26 U/L (ref 11–59)

## 2014-08-25 LAB — T4, FREE: Free T4: 1.15 ng/dL (ref 0.80–1.80)

## 2014-08-25 NOTE — ED Provider Notes (Signed)
CSN: [id number]     Arrival date & time 08/25/14  1138 History   First MD Initiated Contact with Patient 08/25/14 1317     Chief Complaint  Patient presents with  . multiple complaints     (Consider location/radiation/quality/duration/timing/severity/associated sxs/prior Treatment) HPI The patient reports that she has a lot of problems at home. She states that several family members have significant problems with drug and alcohol abuse. She states sometimes her husband leaves and she doesn't know when he is coming back and other times when he is at home she thinks he may be suicidal. She reports that she feels tired and has pain all over her body. She reports she has a very itchy rash that she thinks is due to bed bugs. She has not had a fever. She reports she does continue to take her medications as prescribed. She denies she's had any suicidal or homicidal thoughts. She denies she abuses alcohol or drugs. Past Medical History  Diagnosis Date  . Diabetes mellitus   . Hypertension   . Anxiety   . Chronic pain   . Fibromyalgia   . Chronic neck pain   . Chronic back pain   . Hemochromatosis   . Alcoholic hepatitis   . Hepatic encephalopathy   . Thrombocytopenia   . Thrombus DVT   Past Surgical History  Procedure Laterality Date  . Rotator cuff repair     No family history on file. History  Substance Use Topics  . Smoking status: Current Every Day Smoker    Types: Cigarettes  . Smokeless tobacco: Not on file  . Alcohol Use: Yes   OB History    No data available     Review of Systems 10 Systems reviewed and are negative for acute change except as noted in the HPI.    Allergies  Penicillins and Codeine  Home Medications   Prior to Admission medications   Medication Sig Start Date End Date Taking? Authorizing Provider  cyclobenzaprine (FLEXERIL) 10 MG tablet Take 10 mg by mouth 3 (three) times daily as needed for muscle spasms (muscle spasms).   Yes Historical  Provider, MD  furosemide (LASIX) 40 MG tablet Take 40 mg by mouth daily.   Yes Historical Provider, MD  insulin glargine (LANTUS) 100 UNIT/ML injection Inject 0.23 mLs (23 Units total) into the skin at bedtime. Patient taking differently: Inject 50 Units into the skin at bedtime.  01/06/14  Yes Marianne L York, PA-C  lactulose (CHRONULAC) 10 GM/15ML solution Take 30 g by mouth 3 (three) times daily.   Yes Historical Provider, MD  omeprazole (PRILOSEC) 20 MG capsule Take 20 mg by mouth daily.   Yes Historical Provider, MD  Oxycodone HCl (ROXICODONE) 10 MG TABS Take 1 tablet (10 mg total) by mouth every 6 (six) hours as needed for severe pain. 01/06/14  Yes Tora Kindred York, PA-C  rivaroxaban (XARELTO) 20 MG TABS tablet Take 20 mg by mouth daily.   Yes Historical Provider, MD  spironolactone (ALDACTONE) 100 MG tablet Take 100 mg by mouth daily.   Yes Historical Provider, MD  ALPRAZolam (XANAX) 0.25 MG tablet Take 1 tablet (0.25 mg total) by mouth 2 (two) times daily. Patient not taking: Reported on 08/25/2014 01/06/14   Stephani Police, PA-C  rifaximin (XIFAXAN) 550 MG TABS Take 550 mg by mouth 2 (two) times daily.    Historical Provider, MD   BP 111/72 mmHg  Pulse 89  Temp(Src) 98.6 F (37 C) (Oral)  Resp 26  SpO2 98% Physical Exam  Constitutional: She is oriented to person, place, and time.  The patient is alert. She is nontoxic. She is sitting up at the edge of the bed without appearance of acute distress. She is moderately obese. The patient is appropriately dressed and has her medications with her.  HENT:  Head: Normocephalic and atraumatic.  Eyes: Pupils are equal, round, and reactive to light. Right eye exhibits no discharge. Left eye exhibits no discharge.  The patient has a external strabismus on the right.  Neck: Neck supple.  Cardiovascular: Normal rate, regular rhythm, normal heart sounds and intact distal pulses.   Pulmonary/Chest: Effort normal and breath sounds normal.  Abdominal:  Soft. Bowel sounds are normal. She exhibits no distension. There is no tenderness.  Musculoskeletal: Normal range of motion. She exhibits no edema or tenderness.  Lower extremities have no peripheral edema. The calves are soft and nontender. She does have chronic varicosities.  Neurological: She is alert and oriented to person, place, and time. She has normal strength. Coordination normal. GCS eye subscore is 4. GCS verbal subscore is 5. GCS motor subscore is 6.  Skin: Skin is warm, dry and intact.  The patient does have small papular lesions on her arms and legs consistent with insect bites. None of these appear secondarily infected.  Psychiatric: She has a normal mood and affect.    ED Course  Procedures (including critical care time) Labs Review Labs Reviewed  COMPREHENSIVE METABOLIC PANEL - Abnormal; Notable for the following:    Glucose, Bld 160 (*)    BUN 25 (*)    Creatinine, Ser 1.12 (*)    Total Protein 9.3 (*)    GFR calc non Af Amer 53 (*)    GFR calc Af Amer 61 (*)    All other components within normal limits  CBC WITH DIFFERENTIAL/PLATELET - Abnormal; Notable for the following:    Hemoglobin 15.2 (*)    All other components within normal limits  ETHANOL  LIPASE, BLOOD  TSH  AMMONIA  URINALYSIS, ROUTINE W REFLEX MICROSCOPIC  URINE RAPID DRUG SCREEN (HOSP PERFORMED)  T4, FREE  I-STAT TROPOININ, ED    Imaging Review Dg Chest 2 View  08/25/2014   CLINICAL DATA:  Cough for 1 month, sinus drainage  EXAM: CHEST  2 VIEW  COMPARISON:  01/04/2014  FINDINGS: The heart size and mediastinal contours are within normal limits. Both lungs are clear. The visualized skeletal structures are unremarkable.  IMPRESSION: No active cardiopulmonary disease.   Electronically Signed   By: Elige Ko   On: 08/25/2014 14:09     EKG Interpretation   Date/Time:  Monday August 25 2014 14:32:31 EDT Ventricular Rate:  90 PR Interval:  165 QRS Duration: 88 QT Interval:  386 QTC Calculation:  472 R Axis:   -14 Text Interpretation:  Sinus rhythm Probable left atrial enlargement Low  voltage, precordial leads normal Confirmed by Donnald Garre, MD, Lebron Conners [phone]  on 08/25/2014 3:49:00 PM      MDM   Final diagnoses:  Malaise and fatigue  Anxiety  Stress due to marital problems   At this time the patient does not appear to have any acute medical conditions. She does have multiple chronic conditions. There is no metabolic derangement at this time. By physical examination diagnostic workup I do not identify any focus of infection. The patient does seem to have a fairly complicated home life however she herself does not appear to be acutely intoxicated or impaired due to medications.  She also does not express suicidal or homicidal thoughts for herself. She has concern for family members. At this time I have offered the patient consultation with social work for further discussion regarding her home situation. Currently the patient declines any further evaluation. She is counseled to follow-up with her family physician to assist both in ongoing medical care as well as referrals as needed for her personal and home life. At this time there are no family members or friends present to give any additional information regarding the patient's home life.    Arby BarretteMarcy Jamoni Broadfoot, MD 08/25/14 [phone]

## 2014-08-25 NOTE — ED Notes (Signed)
Patient constantly rambling about husband and dog at home. Patient didn't want to remove clothing. This tech was only able to get shirt off patient. Patient asking for water and doesnt want to lay back in bed.

## 2014-08-25 NOTE — ED Notes (Signed)
Pt was brought in by EMS; she has multiple complaints, she sts "I'm so tires I can't deal with it anymore. I hurt all over, my body is non stop in pain, I have diabetes and fibromyalgia. I also have DVT in my legs and I am tired. My liver quit working and I was told I only have 70 hours to live, and my husband brought home bedbugs so now I'm itchy all over. And I am so tired, I can't deal with it."

## 2014-08-25 NOTE — ED Notes (Signed)
Patient stated that she cannot give urine sample at this time, she needs food and drink to be able to go to the restroom. RN aware

## 2014-08-25 NOTE — ED Notes (Signed)
Bed: WA01 Expected date:  Expected time:  Means of arrival:  Comments: EMS- sick for years

## 2014-08-25 NOTE — ED Notes (Signed)
Pt is insisting on taking her home medications. Attempted to explain to her why she should not do that, however she keep saying "no you don't understand, I need my medications". Will notify EDP.

## 2014-08-25 NOTE — ED Notes (Signed)
Went in to collect labs patient is not in the room.

## 2014-08-25 NOTE — Discharge Instructions (Signed)
Weakness °Weakness is a lack of strength. It may be felt all over the body (generalized) or in one specific part of the body (focal). Some causes of weakness can be serious. You may need further medical evaluation, especially if you are elderly or you have a history of immunosuppression (such as chemotherapy or HIV), kidney disease, heart disease, or diabetes. °CAUSES  °Weakness can be caused by many different things, including: °· Infection. °· Physical exhaustion. °· Internal bleeding or other blood loss that results in a lack of red blood cells (anemia). °· Dehydration. This cause is more common in elderly people. °· Side effects or electrolyte abnormalities from medicines, such as pain medicines or sedatives. °· Emotional distress, anxiety, or depression. °· Circulation problems, especially severe peripheral arterial disease. °· Heart disease, such as rapid atrial fibrillation, bradycardia, or heart failure. °· Nervous system disorders, such as Guillain-Barré syndrome, multiple sclerosis, or stroke. °DIAGNOSIS  °To find the cause of your weakness, your caregiver will take your history and perform a physical exam. Lab tests or X-rays may also be ordered, if needed. °TREATMENT  °Treatment of weakness depends on the cause of your symptoms and can vary greatly. °HOME CARE INSTRUCTIONS  °· Rest as needed. °· Eat a well-balanced diet. °· Try to get some exercise every day. °· Only take over-the-counter or prescription medicines as directed by your caregiver. °SEEK MEDICAL CARE IF:  °· Your weakness seems to be getting worse or spreads to other parts of your body. °· You develop new aches or pains. °SEEK IMMEDIATE MEDICAL CARE IF:  °· You cannot perform your normal daily activities, such as getting dressed and feeding yourself. °· You cannot walk up and down stairs, or you feel exhausted when you do so. °· You have shortness of breath or chest pain. °· You have difficulty moving parts of your body. °· You have weakness  in only one area of the body or on only one side of the body. °· You have a fever. °· You have trouble speaking or swallowing. °· You cannot control your bladder or bowel movements. °· You have black or bloody vomit or stools. °MAKE SURE YOU: °· Understand these instructions. °· Will watch your condition. °· Will get help right away if you are not doing well or get worse. °Document Released: 05/09/2005 Document Revised: 11/08/2011 Document Reviewed: 07/08/2011 °ExitCare® Patient Information ©2015 [employer]. This information is not intended to replace advice given to you by your health care provider. Make sure you discuss any questions you have with your health care provider. ° ° °Emergency Department Resource Guide °1) Find a Doctor and Pay Out of Pocket °Although you won't have to find out who is covered by your insurance plan, it is a good idea to ask around and get recommendations. You will then need to call the office and see if the doctor you have chosen will accept you as a new patient and what types of options they offer for patients who are self-pay. Some doctors offer discounts or will set up payment plans for their patients who do not have insurance, but you will need to ask so you aren't surprised when you get to your appointment. ° °2) Contact Your Local Health Department °Not all health departments have doctors that can see patients for sick visits, but many do, so it is worth a call to see if yours does. If you don't know where your local health department is, you can check in your phone book. The   CDC also has a tool to help you locate your state's health department, and many state websites also have listings of all of their local health departments. ° °3) Find a Walk-in Clinic °If your illness is not likely to be very severe or complicated, you may want to try a walk in clinic. These are popping up all over the country in pharmacies, drugstores, and shopping centers. They're usually staffed by  nurse practitioners or physician assistants that have been trained to treat common illnesses and complaints. They're usually fairly quick and inexpensive. However, if you have serious medical issues or chronic medical problems, these are probably not your best option. ° °No Primary Care Doctor: °- Call [facility] at  832-8000 - they can help you locate a primary care doctor that  accepts your insurance, provides certain services, etc. °- Physician Referral Service- 1-800-533-3463 ° °Chronic Pain Problems: °Organization         Address  Phone   Notes  °[facility]  (336) 297-2271 Patients need to be referred by their primary care doctor.  ° °Medication Assistance: °Organization         Address  Phone   Notes  °[place] [facility] [address] °[place], [place] [address] (336) 641-8030 --Must be a resident of [place] °-- Must have NO insurance coverage whatsoever (no Medicaid/ Medicare, etc.) °-- The pt. MUST have a primary care doctor that directs their care regularly and follows them in the community °  °[facility]  (866) 331-1348   °[employer]  (888) 892-1162   ° °Agencies that provide inexpensive medical care: °Organization         Address  Phone   Notes  °[facility]  (336) 832-8035   °[facility]    (336) 832-7272   °[facility] [address] °[place], [place] [address] (336) 832-4777   °[facility] [address], °[place] (336) 271-4999   °[facility]    (336) 373-0678   °[facility]    (336) 272-1050   °[facility] ° [address], [place] Phone:  (336) 832-4444, Fax:  (336) 832-4440 Hours of Operation:  9 am - 6 pm, M-F.  Also accepts Medicaid/Medicare and self-pay.  °[facility] ° [address], [place] Phone: (336) 832-3150, Fax: (336) 832-3151. Hours of Operation:  8:30  am - 5:30 pm, M-F.  Also accepts Medicaid and self-pay.  °[facility] [place] [address], [place] Phone: (336) 878-6027   °[facility] [address], [place], [place] (336)723-1848, Ext. 123 Mondays & Thursdays: 7-9 AM.  First 15 patients are seen on a first come, first serve basis. °  ° °Medicaid-accepting [place] Providers: ° °Organization         Address  Phone   Notes  °[facility] [address], [place] (336) 641-2100 Also accepts self-pay patients.  °[facility] [address], [place] ° (336) 856-9996   °[facility] [address], [place] (336) 288-8857   °[facility] [address] [place] [address], [place] (336) 299-7000   °Veita Bland [address], [place]  ° (336) 373-1557 Only accepts [place] Access Medicaid patients after they have their name applied to their card.  ° °Self-Pay (no   insurance) in [place]: ° °Organization         Address  Phone   Notes  °Sickle Cell Patients, [facility] [address], [place] (336) 832-1970   °[facility] [address], [place] (336) 832-4400   °[facility] ° [address], [place] (336) 992-4800   °[facility]/Dr. Osei-Bonsu ° [address] [place] [address], [place] or [address], [place] (336) 841-8500 Phone number for both [place] and [place] locations is the same.  °[facility] [address], [place] (336) 299-0000   °[facility] [address] [place] [address], [place] or [address] (336) 852-7530 °(336) 878-2260   °[facility] [address], [place] (336) 350-1642, phone; (336) 294-5005, fax Sees patients 1st and 3rd Saturday of every month.  Must not qualify for public or private insurance (i.e. Medicaid, Medicare, [place] [facility]  Choice, Veterans' Benefits) • Household income should be no more than 200% of the poverty level •The clinic cannot treat you if you are pregnant or think you are pregnant • Sexually transmitted diseases are not treated at the clinic.  ° ° °Dental Care: °Organization         Address  Phone  Notes  °[facility] [address], [place] (336) 641-6152 Accepts children up to age 21 who are enrolled in Medicaid or [place] Health Choice; pregnant women with a Medicaid card; and children who have applied for Medicaid or [place] Health Choice, but were declined, whose parents can pay a reduced fee at time of service.  °[facility] [place]  [address], [place] (336) 641-7733 Accepts children up to age 21 who are enrolled in Medicaid or [place] Health Choice; pregnant women with a Medicaid card; and children who have applied for Medicaid or [place] Health Choice, but were declined, whose parents can pay a reduced fee at time of service.  °[facility] ° [address], [place] (336) 641-4533 Patients are seen by appointment only. Walk-ins are not accepted. [facility] will see patients 18 years of age and older. °Monday - Tuesday (8am-5pm) °Most Wednesdays (8:30-5pm) °$30 per visit, cash only  °[facility] ° [address], [place] (336) 641-4533 Patients are seen by appointment only. Walk-ins are not accepted. [facility] will see patients 18 years of age and older. °One Wednesday Evening (Monthly: Volunteer Based).  $30 per visit, cash only  °[facility]  (919) 537-3737 for adults; Children under age 4, call Graduate Pediatric Dentistry at ([phone]) [phone]. Children aged 4-14, please call (919) [phone] to request a pediatric application. ° Dental services are provided in all areas of dental care including fillings, crowns and bridges, complete  and partial dentures, implants, gum treatment, root canals, and extractions. Preventive care is also provided. Treatment is provided to both adults and children. °Patients are selected via a lottery and there is often a waiting list. °  °[facility] [address], ° ° (336) 763-8833 [email or web address] °  °[facility] [address], [place], [place] (336)723-1848, Ext. 123 Second and Fourth Thursday of each month, opens at 6:30 AM; Clinic ends at 9 AM.  Patients are seen on a first-come first-served basis, and a limited number are seen   during each clinic.  ° °[facility] ° [address], [place], [place] (336) 723-7904   Eligibility Requirements °You must have lived in [place], [place], or [place] counties for at least the last three months. °  You cannot be eligible for state or federal sponsored healthcare insurance, including Veterans Administration, Medicaid, or Medicare. °  You generally cannot be eligible for healthcare insurance through your employer.  °  How to apply: °Eligibility screenings are held every Tuesday and Wednesday afternoon from 1:00 pm until 4:00 pm. You do not need an appointment for the interview!  °[facility] [address], [place], [place] 336-631-2330   °[facility]  336-342-8273   °[facility]  336-703-3100   °[facility]  336-570-6415   ° °Behavioral Health Resources in the Community: °Intensive Outpatient Programs °Organization         Address  Phone  Notes  °[facility] [address], [place], [place] 336-878-6098   °[facility] [address], [place], [place] 336-832-9800   °[facility] [address], [place], [place] ° 336-882-2125   °[facility] [address],  °[place], [place] 1-800-853-5163 or 336-641-4981   °Substance Abuse Resources °Organization          Address  Phone  Notes  °[facility]  336-882-2125   °[facility]  336-784-9470   °[facility]  336-285-9073   °[facility]  336-845-3988   °Residential & Outpatient Substance Abuse Program  1-800-659-3381   °Psychological Services °Organization         Address  Phone  Notes  °[facility]  336- 832-9600   °[facility]  336- 378-7881   °[facility] [address], [place] 1-800-853-5163 or 336-641-4981   ° °Mobile Crisis Teams °Organization         Address  Phone  Notes  °[facility], Mobile Crisis Care Unit  1-877-626-1772   °Assertive °[facility] ° [address] [place], [place] 336-834-9664   °Sharon DeEsch [address] °[place] 336-554-5454   ° °Self-Help/Support Groups °Organization         Address  Phone             Notes  °[facility] - variety of support groups  336- 373-1402 Call for more information  °Narcotics Anonymous (NA), [facility] [address], °[place]  2 meetings at this location  ° °Residential Treatment Programs °Organization         Address  Phone  Notes  °[facility] [address],    °[place]  1-866-801-8205   °[facility] ° [address], [place], [place] 704-293-8524   °[facility] [address], [place] 336-845-3988 Admissions: 8am-3pm M-F  °[facility] [address]    °[place], [place] 336-841-1104   °[facility] [address], [place], [place] 336-379-7146   °[facility] [address]  °[place], [place] 336-285-9073   °[facility] - Intensive Outpatient [address], [place], [place] 336-852-3033   °[facility] [address]  °[place], [place] 1-877-615-2722 or 336-784-9470   °[facility] [address] [place], [place]  336-227-7417 Accepts Medicaid  °[facility] [address]  °  [place] 1-800-659-3381 Substance Abuse/Addiction Treatment  ° °[place] Behavioral Health Resources °Organization         Address  Phone  Notes  °[facility]  (888) 581-9988   °Julie Brannon, PhD [address] [place], [place]   (336) 349-5553 or (336) 951-0000   °[facility]   [address] °[place], [place] (336) 349-4454   °[facility] [address], [place], [place] (336) 342-8316 Insurance/Medicaid/sponsorship through [facility]  °[facility] [address]                                    [place], [place] (336) 342-8316 Therapy/tele-psych/case  °[facility] [address]  ° [place], [place] (336) 349-2233    °Dr. Arfeen  (336) 349-4544   °[facility] [place]  [employer] [place] [facility] 1) [address], [place] °2) [address], [place] °3)  [address], [place] (336) 349-3220 °(336) 342-7768 ° °(336) 342-8140   °[place] [facility] (336) 342-1394 or (336) 342-3537 (After Hours)    ° ° ° °

## 2014-08-26 ENCOUNTER — Encounter (HOSPITAL_COMMUNITY): Payer: Self-pay

## 2014-08-26 ENCOUNTER — Emergency Department (HOSPITAL_COMMUNITY)
Admission: EM | Admit: 2014-08-26 | Discharge: 2014-08-26 | Disposition: A | Discharge status: Home / Self Care | Payer: BLUE CROSS/BLUE SHIELD | Attending: Emergency Medicine | Admitting: Emergency Medicine

## 2014-08-26 DIAGNOSIS — Z8719 Personal history of other diseases of the digestive system: Secondary | ICD-10-CM | POA: Diagnosis not present

## 2014-08-26 DIAGNOSIS — M797 Fibromyalgia: Secondary | ICD-10-CM | POA: Insufficient documentation

## 2014-08-26 DIAGNOSIS — Z046 Encounter for general psychiatric examination, requested by authority: Secondary | ICD-10-CM | POA: Diagnosis present

## 2014-08-26 DIAGNOSIS — Z794 Long term (current) use of insulin: Secondary | ICD-10-CM | POA: Insufficient documentation

## 2014-08-26 DIAGNOSIS — Z79899 Other long term (current) drug therapy: Secondary | ICD-10-CM | POA: Diagnosis not present

## 2014-08-26 DIAGNOSIS — R609 Edema, unspecified: Secondary | ICD-10-CM | POA: Insufficient documentation

## 2014-08-26 DIAGNOSIS — F419 Anxiety disorder, unspecified: Secondary | ICD-10-CM | POA: Diagnosis not present

## 2014-08-26 DIAGNOSIS — R21 Rash and other nonspecific skin eruption: Secondary | ICD-10-CM | POA: Insufficient documentation

## 2014-08-26 DIAGNOSIS — Z880 Allergy status to penicillin: Secondary | ICD-10-CM | POA: Diagnosis not present

## 2014-08-26 DIAGNOSIS — Z7901 Long term (current) use of anticoagulants: Secondary | ICD-10-CM | POA: Diagnosis not present

## 2014-08-26 DIAGNOSIS — I10 Essential (primary) hypertension: Secondary | ICD-10-CM | POA: Diagnosis not present

## 2014-08-26 DIAGNOSIS — G8929 Other chronic pain: Secondary | ICD-10-CM | POA: Diagnosis not present

## 2014-08-26 DIAGNOSIS — Z720 Tobacco use: Secondary | ICD-10-CM | POA: Insufficient documentation

## 2014-08-26 DIAGNOSIS — Z86718 Personal history of other venous thrombosis and embolism: Secondary | ICD-10-CM | POA: Diagnosis not present

## 2014-08-26 DIAGNOSIS — F32A Depression, unspecified: Secondary | ICD-10-CM

## 2014-08-26 DIAGNOSIS — Z862 Personal history of diseases of the blood and blood-forming organs and certain disorders involving the immune mechanism: Secondary | ICD-10-CM | POA: Insufficient documentation

## 2014-08-26 DIAGNOSIS — E119 Type 2 diabetes mellitus without complications: Secondary | ICD-10-CM | POA: Diagnosis not present

## 2014-08-26 DIAGNOSIS — F329 Major depressive disorder, single episode, unspecified: Secondary | ICD-10-CM | POA: Insufficient documentation

## 2014-08-26 LAB — RAPID URINE DRUG SCREEN, HOSP PERFORMED
Amphetamines: NOT DETECTED
Barbiturates: NOT DETECTED
Benzodiazepines: POSITIVE — AB
Cocaine: POSITIVE — AB
Opiates: POSITIVE — AB
Tetrahydrocannabinol: NOT DETECTED

## 2014-08-26 LAB — CBC WITH DIFFERENTIAL/PLATELET
Basophils Absolute: 0 10*3/uL (ref 0.0–0.1)
Basophils Relative: 0 % (ref 0–1)
Eosinophils Absolute: 0.4 10*3/uL (ref 0.0–0.7)
Eosinophils Relative: 4 % (ref 0–5)
HCT: 45 % (ref 36.0–46.0)
Hemoglobin: 15.4 g/dL — ABNORMAL HIGH (ref 12.0–15.0)
Lymphocytes Relative: 17 % (ref 12–46)
Lymphs Abs: 1.7 10*3/uL (ref 0.7–4.0)
MCH: 32.2 pg (ref 26.0–34.0)
MCHC: 34.2 g/dL (ref 30.0–36.0)
MCV: 93.9 fL (ref 78.0–100.0)
Monocytes Absolute: 0.9 10*3/uL (ref 0.1–1.0)
Monocytes Relative: 9 % (ref 3–12)
Neutro Abs: 6.6 10*3/uL (ref 1.7–7.7)
Neutrophils Relative %: 70 % (ref 43–77)
Platelets: 200 10*3/uL (ref 150–400)
RBC: 4.79 MIL/uL (ref 3.87–5.11)
RDW: 13.4 % (ref 11.5–15.5)
WBC: 9.5 10*3/uL (ref 4.0–10.5)

## 2014-08-26 LAB — COMPREHENSIVE METABOLIC PANEL
ALT: 20 U/L (ref 0–35)
AST: 32 U/L (ref 0–37)
Albumin: 4.8 g/dL (ref 3.5–5.2)
Alkaline Phosphatase: 75 U/L (ref 39–117)
Anion gap: 12 (ref 5–15)
BUN: 24 mg/dL — ABNORMAL HIGH (ref 6–23)
CO2: 23 mmol/L (ref 19–32)
Calcium: 9.4 mg/dL (ref 8.4–10.5)
Chloride: 100 mmol/L (ref 96–112)
Creatinine, Ser: 1.12 mg/dL — ABNORMAL HIGH (ref 0.50–1.10)
GFR calc Af Amer: 61 mL/min — ABNORMAL LOW (ref >90)
GFR calc non Af Amer: 53 mL/min — ABNORMAL LOW (ref >90)
Glucose, Bld: 129 mg/dL — ABNORMAL HIGH (ref 70–99)
Potassium: 4.1 mmol/L (ref 3.5–5.1)
Sodium: 135 mmol/L (ref 135–145)
Total Bilirubin: 0.9 mg/dL (ref 0.3–1.2)
Total Protein: 9.1 g/dL — ABNORMAL HIGH (ref 6.0–8.3)

## 2014-08-26 LAB — ETHANOL: Alcohol, Ethyl (B): <5 mg/dL (ref 0–9)

## 2014-08-26 MED ORDER — PERMETHRIN 5 % EX CREA: TOPICAL_CREAM | CUTANEOUS | Status: DC

## 2014-08-26 MED ORDER — ONDANSETRON HCL 4 MG PO TABS: 4.0000 mg | ORAL_TABLET | Freq: Three times a day (TID) | ORAL | Status: DC | PRN

## 2014-08-26 MED ORDER — ALUM & MAG HYDROXIDE-SIMETH 200-200-20 MG/5ML PO SUSP: 30.0000 mL | ORAL | Status: DC | PRN

## 2014-08-26 MED ORDER — ACETAMINOPHEN 325 MG PO TABS: 650.0000 mg | ORAL_TABLET | ORAL | Status: DC | PRN
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
## 2014-08-26 NOTE — Discharge Instructions (Signed)
Return here as needed.  Follow-up with your primary care doctor °
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
## 2014-08-26 NOTE — ED Provider Notes (Signed)
CSN: [id number]     Arrival date & time 08/26/14  1707 History   First MD Initiated Contact with Patient 08/26/14 1734     Chief Complaint  Patient presents with  . Depression  . Medical Clearance  . Rash     (Consider location/radiation/quality/duration/timing/severity/associated sxs/prior Treatment) HPI Patient presents to the emergency department with depression and anxiety.  The patient states that she is not suicidal or homicidal, but has increasing depression and feels like her husband is a danger to her.  Patient states she does not have any other symptoms at this time.  She is denying chest pain, shortness breath, weakness, dizziness, headache, blurred vision, back pain, neck pain, nausea, vomiting, or hallucinations Past Medical History  Diagnosis Date  . Diabetes mellitus   . Hypertension   . Anxiety   . Chronic pain   . Fibromyalgia   . Chronic neck pain   . Chronic back pain   . Hemochromatosis   . Alcoholic hepatitis   . Hepatic encephalopathy   . Thrombocytopenia   . Thrombus DVT   Past Surgical History  Procedure Laterality Date  . Rotator cuff repair     History reviewed. No pertinent family history. History  Substance Use Topics  . Smoking status: Current Every Day Smoker    Types: Cigarettes  . Smokeless tobacco: Not on file  . Alcohol Use: No   OB History    No data available     Review of Systems  All other systems negative except as documented in the HPI. All pertinent positives and negatives as reviewed in the HPI.  Allergies  Penicillins and Codeine  Home Medications   Prior to Admission medications   Medication Sig Start Date End Date Taking? Authorizing Provider  ALPRAZolam Prudy Feeler(XANAX) 1 MG tablet Take 1 tablet by mouth 3 (three) times daily as needed for anxiety.  07/14/14  Yes Historical Provider, MD  cyclobenzaprine (FLEXERIL) 10 MG tablet Take 10 mg by mouth every 12 (twelve) hours as needed for muscle spasms (muscle spasms).    Yes  Historical Provider, MD  furosemide (LASIX) 40 MG tablet Take 40 mg by mouth daily.   Yes Historical Provider, MD  insulin glargine (LANTUS) 100 UNIT/ML injection Inject 0.23 mLs (23 Units total) into the skin at bedtime. Patient taking differently: Inject 50 Units into the skin at bedtime.  01/06/14  Yes Marianne L York, PA-C  omeprazole (PRILOSEC) 20 MG capsule Take 20 mg by mouth daily.   Yes Historical Provider, MD  Oxycodone HCl (ROXICODONE) 10 MG TABS Take 1 tablet (10 mg total) by mouth every 6 (six) hours as needed for severe pain. 01/06/14  Yes Tora KindredMarianne L York, PA-C  rifaximin (XIFAXAN) 550 MG TABS Take 550 mg by mouth 2 (two) times daily.   Yes Historical Provider, MD  rivaroxaban (XARELTO) 20 MG TABS tablet Take 20 mg by mouth daily.   Yes Historical Provider, MD  spironolactone (ALDACTONE) 100 MG tablet Take 100 mg by mouth daily.   Yes Historical Provider, MD  ALPRAZolam (XANAX) 0.25 MG tablet Take 1 tablet (0.25 mg total) by mouth 2 (two) times daily. Patient not taking: Reported on 08/25/2014 01/06/14   Stephani PoliceMarianne L York, PA-C  lactulose (CHRONULAC) 10 GM/15ML solution Take 30 g by mouth 3 (three) times daily.    Historical Provider, MD  Oxycodone HCl 10 MG TABS Take 10 mg by mouth.    Historical Provider, MD   BP 135/81 mmHg  Pulse 90  Temp(Src) 98.7  F (37.1 C) (Oral)  Resp 16  SpO2 97% Physical Exam  Constitutional: She is oriented to person, place, and time. She appears well-developed and well-nourished. No distress.  HENT:  Head: Normocephalic and atraumatic.  Mouth/Throat: Oropharynx is clear and moist.  Eyes: Pupils are equal, round, and reactive to light.  Neck: Normal range of motion. Neck supple.  Cardiovascular: Normal rate and regular rhythm.   Pulmonary/Chest: Effort normal and breath sounds normal. No respiratory distress.  Musculoskeletal: She exhibits edema.  Neurological: She is alert and oriented to person, place, and time. She exhibits normal muscle tone.  Coordination normal.  Skin: Skin is warm and dry. Rash noted.  Nursing note and vitals reviewed.   ED Course  Procedures (including critical care time) Labs Review Labs Reviewed  CBC WITH DIFFERENTIAL/PLATELET - Abnormal; Notable for the following:    Hemoglobin 15.4 (*)    All other components within normal limits  COMPREHENSIVE METABOLIC PANEL - Abnormal; Notable for the following:    Glucose, Bld 129 (*)    BUN 24 (*)    Creatinine, Ser 1.12 (*)    Total Protein 9.1 (*)    GFR calc non Af Amer 53 (*)    GFR calc Af Amer 61 (*)    All other components within normal limits  URINE RAPID DRUG SCREEN (HOSP PERFORMED) - Abnormal; Notable for the following:    Opiates POSITIVE (*)    Cocaine POSITIVE (*)    Benzodiazepines POSITIVE (*)    All other components within normal limits  ETHANOL    Imaging Review Dg Chest 2 View  08/25/2014   CLINICAL DATA:  Cough for 1 month, sinus drainage  EXAM: CHEST  2 VIEW  COMPARISON:  01/04/2014  FINDINGS: The heart size and mediastinal contours are within normal limits. Both lungs are clear. The visualized skeletal structures are unremarkable.  IMPRESSION: No active cardiopulmonary disease.   Electronically Signed   By: Elige Ko   On: 08/25/2014 14:09     Patient is referred back to her primary care doctor.  Also treat her for the rash.  Told to return here as needed.  Patient was seen by TTS, and they felt she was stable for discharge MDM   Final diagnoses:  None       Charlestine Night, PA-C 08/26/14 2325  Purvis Sheffield, MD 08/27/14 0003

## 2014-08-26 NOTE — ED Notes (Signed)
Patiaent states she thinks she has bed bug bites.
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
## 2014-08-26 NOTE — ED Notes (Signed)
Brother at bedside, pending final dispo.

## 2014-08-27 NOTE — BHH Counselor (Signed)
Per Patriciaann Clan, PA, pt does not meet inpt criteria. Social Work consult recommended to assist pt with referral to [employer] or other housing.  TTS Counselor informed Education officer, museum at [employer]) of need for consult. LCSW met with pt.  Pt to be d/c with resources for housing.   Ramond Dial, [facility] Triage Specialist

## 2014-09-01 ENCOUNTER — Other Ambulatory Visit: Payer: Self-pay | Admitting: Family Medicine

## 2014-09-01 DIAGNOSIS — Z1231 Encounter for screening mammogram for malignant neoplasm of breast: Secondary | ICD-10-CM

## 2014-09-12 ENCOUNTER — Ambulatory Visit: Payer: BLUE CROSS/BLUE SHIELD

## 2015-02-02 ENCOUNTER — Encounter (HOSPITAL_COMMUNITY): Payer: Self-pay | Admitting: Emergency Medicine

## 2015-02-02 ENCOUNTER — Emergency Department (HOSPITAL_COMMUNITY)
Admission: EM | Admit: 2015-02-02 | Discharge: 2015-02-02 | Disposition: A | Discharge status: Home / Self Care | Payer: BLUE CROSS/BLUE SHIELD | Attending: Emergency Medicine | Admitting: Emergency Medicine

## 2015-02-02 DIAGNOSIS — Z7901 Long term (current) use of anticoagulants: Secondary | ICD-10-CM | POA: Diagnosis not present

## 2015-02-02 DIAGNOSIS — Z862 Personal history of diseases of the blood and blood-forming organs and certain disorders involving the immune mechanism: Secondary | ICD-10-CM | POA: Insufficient documentation

## 2015-02-02 DIAGNOSIS — Z794 Long term (current) use of insulin: Secondary | ICD-10-CM | POA: Diagnosis not present

## 2015-02-02 DIAGNOSIS — E119 Type 2 diabetes mellitus without complications: Secondary | ICD-10-CM | POA: Diagnosis not present

## 2015-02-02 DIAGNOSIS — G8929 Other chronic pain: Secondary | ICD-10-CM | POA: Diagnosis not present

## 2015-02-02 DIAGNOSIS — Z880 Allergy status to penicillin: Secondary | ICD-10-CM | POA: Diagnosis not present

## 2015-02-02 DIAGNOSIS — Z720 Tobacco use: Secondary | ICD-10-CM | POA: Insufficient documentation

## 2015-02-02 DIAGNOSIS — F419 Anxiety disorder, unspecified: Secondary | ICD-10-CM | POA: Insufficient documentation

## 2015-02-02 DIAGNOSIS — I10 Essential (primary) hypertension: Secondary | ICD-10-CM | POA: Diagnosis not present

## 2015-02-02 DIAGNOSIS — Z8719 Personal history of other diseases of the digestive system: Secondary | ICD-10-CM | POA: Insufficient documentation

## 2015-02-02 DIAGNOSIS — M797 Fibromyalgia: Secondary | ICD-10-CM | POA: Diagnosis present

## 2015-02-02 DIAGNOSIS — Z79899 Other long term (current) drug therapy: Secondary | ICD-10-CM | POA: Diagnosis not present

## 2015-02-02 MED ORDER — OXYCODONE HCL 5 MG PO TABS: 10.0000 mg | ORAL_TABLET | Freq: Once | ORAL | Status: DC

## 2015-02-02 MED ORDER — OXYCODONE HCL 5 MG PO TABS
10.0000 mg | ORAL_TABLET | Freq: Once | ORAL | Status: AC
Start: 2015-02-02 — End: 2015-02-02
  Administered 2015-02-02: 10 mg via ORAL
  Filled 2015-02-02: qty 2

## 2015-02-02 NOTE — ED Provider Notes (Signed)
CSN: [id number]     Arrival date & time 02/02/15  0547 History   First MD Initiated Contact with Patient 02/02/15 0602     Chief Complaint  Patient presents with  . Fibromyalgia     (Consider location/radiation/quality/duration/timing/severity/associated sxs/prior Treatment) The history is provided by the patient.    59 y.o. F with hx of HTN, DM, anxiety, chronic pain (neck/back), fibromyalgia, presenting to the ED for pain.  Patient states she missed her appt with pain management on august 18th because she had bed bugs and did not want to spread it to the other patients.  She states she has been without her home oxycodone for about 1 week now.  States she has been taking this for almost 7 years no-- sees [facility].  She denies any changes in her pain, just is not controlled without her meds.  No new numbness, weakness, or paresthesias.  No bowel or bladder incontinence.  No fever, chills, sweats.  States she does not go back to pain management until September 19th.  VSS.  Past Medical History  Diagnosis Date  . Diabetes mellitus   . Hypertension   . Anxiety   . Chronic pain   . Fibromyalgia   . Chronic neck pain   . Chronic back pain   . Hemochromatosis   . Alcoholic hepatitis   . Hepatic encephalopathy   . Thrombocytopenia   . Thrombus DVT   Past Surgical History  Procedure Laterality Date  . Rotator cuff repair     No family history on file. Social History  Substance Use Topics  . Smoking status: Current Every Day Smoker    Types: Cigarettes  . Smokeless tobacco: None  . Alcohol Use: No   OB History    No data available     Review of Systems  Musculoskeletal: Positive for back pain, arthralgias and neck pain.  All other systems reviewed and are negative.     Allergies  Penicillins and Codeine  Home Medications   Prior to Admission medications   Medication Sig Start Date End Date Taking? Authorizing Provider  ALPRAZolam (XANAX) 0.25 MG  tablet Take 1 tablet (0.25 mg total) by mouth 2 (two) times daily. Patient not taking: Reported on 08/25/2014 01/06/14   Stephani Police, PA-C  ALPRAZolam Prudy Feeler) 1 MG tablet Take 1 tablet by mouth 3 (three) times daily as needed for anxiety.  07/14/14   Historical Provider, MD  cyclobenzaprine (FLEXERIL) 10 MG tablet Take 10 mg by mouth every 12 (twelve) hours as needed for muscle spasms (muscle spasms).     Historical Provider, MD  furosemide (LASIX) 40 MG tablet Take 40 mg by mouth daily.    Historical Provider, MD  insulin glargine (LANTUS) 100 UNIT/ML injection Inject 0.23 mLs (23 Units total) into the skin at bedtime. Patient taking differently: Inject 50 Units into the skin at bedtime.  01/06/14   Tora Kindred York, PA-C  lactulose (CHRONULAC) 10 GM/15ML solution Take 30 g by mouth 3 (three) times daily.    Historical Provider, MD  omeprazole (PRILOSEC) 20 MG capsule Take 20 mg by mouth daily.    Historical Provider, MD  Oxycodone HCl (ROXICODONE) 10 MG TABS Take 1 tablet (10 mg total) by mouth every 6 (six) hours as needed for severe pain. 01/06/14   Tora Kindred York, PA-C  Oxycodone HCl 10 MG TABS Take 10 mg by mouth.    Historical Provider, MD  permethrin (ELIMITE) 5 % cream Apply to affected  area once and repeat in 2 weeks. 08/26/14   Charlestine Night, PA-C  rifaximin (XIFAXAN) 550 MG TABS Take 550 mg by mouth 2 (two) times daily.    Historical Provider, MD  rivaroxaban (XARELTO) 20 MG TABS tablet Take 20 mg by mouth daily.    Historical Provider, MD  spironolactone (ALDACTONE) 100 MG tablet Take 100 mg by mouth daily.    Historical Provider, MD   BP 94/67 mmHg  Pulse 82  Temp(Src) 97 F (36.1 C) (Oral)  Resp 17  Ht  (1.676 m)  Wt 230 lb (104.327 kg)  BMI 37.14 kg/m2  SpO2 100%   Physical Exam  Constitutional: She is oriented to person, place, and time. She appears well-developed and well-nourished.  HENT:  Head: Normocephalic and atraumatic.  Mouth/Throat: Oropharynx is clear  and moist.  Eyes: Conjunctivae and EOM are normal. Pupils are equal, round, and reactive to light.  Strabismus noted  Neck: Normal range of motion.  Cardiovascular: Normal rate, regular rhythm and normal heart sounds.   Pulmonary/Chest: Effort normal and breath sounds normal. No respiratory distress. She has no wheezes.  Abdominal: Soft. Bowel sounds are normal.  Musculoskeletal: Normal range of motion.  Neurological: She is alert and oriented to person, place, and time.  AAOx3, answering questions appropriately; equal strength UE and LE bilaterally; CN grossly intact; moves all extremities appropriately without ataxia; no focal neuro deficits or facial asymmetry appreciated  Skin: Skin is warm and dry.  Psychiatric: She has a normal mood and affect.  Intermittently tearful during exam  Nursing note and vitals reviewed.   ED Course  Procedures (including critical care time) Labs Review Labs Reviewed - No data to display  Imaging Review No results found. I have personally reviewed and evaluated these images and lab results as part of my medical decision-making.   EKG Interpretation None      MDM   Final diagnoses:  Chronic pain   59 year old female here with chronic pain. Missed her pain management appointment last month and is currently out of her oxycodone. Patient is afebrile, nontoxic. She is intermittently moaning and tearful during exam. No acute changes in pain, just remains uncontrolled. No focal neurologic deficits to suggest central cord or cauda equina. Low suspicion for spinal abscess.  Discussed with patient that will not write for narcotic scripts as this will violate her pain contract.  Pain was treated here, husband at bedside to drive patient home.  FU with pain management on 9/19 as scheduled.  May see PCP in interim if needed.  Discussed plan with patient, he/she acknowledged understanding and agreed with plan of care.  Return precautions given for new or  worsening symptoms.     Garlon Hatchet, PA-C 02/02/15 4098  Dione Booze, MD 02/02/15 2256

## 2015-02-02 NOTE — Discharge Instructions (Signed)
Follow-up with your pain management clinic as scheduled. Follow-up with your primary care physician in the interim. Return here for new concerns.

## 2015-02-02 NOTE — ED Notes (Signed)
Husband at bedside to transport home.

## 2015-02-02 NOTE — ED Notes (Signed)
Pt and husband yelling and cussing in exam room. GPD at bedside.

## 2015-02-02 NOTE — ED Notes (Addendum)
Patient coming from home with c/o of chronic generalized pain due to Fibromyalgia and other chronic conditions (see history).  Patient needs pain medication until she can see her pain doctor on September 19 with [facility].

## 2015-02-02 NOTE — ED Notes (Addendum)
Pt to have pain medication, but states "I'm going to drive no matter what." Husband at bedside. Provider notified.

## 2015-02-09 ENCOUNTER — Emergency Department (HOSPITAL_COMMUNITY)
Admission: EM | Admit: 2015-02-09 | Discharge: 2015-02-10 | Disposition: A | Discharge status: Home / Self Care | Payer: BLUE CROSS/BLUE SHIELD | Attending: Emergency Medicine | Admitting: Emergency Medicine

## 2015-02-09 ENCOUNTER — Encounter (HOSPITAL_COMMUNITY): Payer: Self-pay | Admitting: *Deleted

## 2015-02-09 ENCOUNTER — Emergency Department (HOSPITAL_COMMUNITY): Payer: BLUE CROSS/BLUE SHIELD

## 2015-02-09 DIAGNOSIS — Z880 Allergy status to penicillin: Secondary | ICD-10-CM | POA: Diagnosis not present

## 2015-02-09 DIAGNOSIS — Z79899 Other long term (current) drug therapy: Secondary | ICD-10-CM | POA: Diagnosis not present

## 2015-02-09 DIAGNOSIS — Z8719 Personal history of other diseases of the digestive system: Secondary | ICD-10-CM | POA: Insufficient documentation

## 2015-02-09 DIAGNOSIS — F10129 Alcohol abuse with intoxication, unspecified: Secondary | ICD-10-CM | POA: Diagnosis not present

## 2015-02-09 DIAGNOSIS — I10 Essential (primary) hypertension: Secondary | ICD-10-CM | POA: Diagnosis not present

## 2015-02-09 DIAGNOSIS — F329 Major depressive disorder, single episode, unspecified: Secondary | ICD-10-CM | POA: Diagnosis not present

## 2015-02-09 DIAGNOSIS — G8929 Other chronic pain: Secondary | ICD-10-CM | POA: Insufficient documentation

## 2015-02-09 DIAGNOSIS — Z7901 Long term (current) use of anticoagulants: Secondary | ICD-10-CM | POA: Insufficient documentation

## 2015-02-09 DIAGNOSIS — Z862 Personal history of diseases of the blood and blood-forming organs and certain disorders involving the immune mechanism: Secondary | ICD-10-CM | POA: Insufficient documentation

## 2015-02-09 DIAGNOSIS — F32A Depression, unspecified: Secondary | ICD-10-CM

## 2015-02-09 DIAGNOSIS — F1020 Alcohol dependence, uncomplicated: Secondary | ICD-10-CM | POA: Diagnosis not present

## 2015-02-09 DIAGNOSIS — IMO0002 Reserved for concepts with insufficient information to code with codable children: Secondary | ICD-10-CM

## 2015-02-09 DIAGNOSIS — M545 Low back pain: Secondary | ICD-10-CM | POA: Diagnosis not present

## 2015-02-09 DIAGNOSIS — E119 Type 2 diabetes mellitus without complications: Secondary | ICD-10-CM | POA: Diagnosis not present

## 2015-02-09 DIAGNOSIS — Z794 Long term (current) use of insulin: Secondary | ICD-10-CM | POA: Insufficient documentation

## 2015-02-09 DIAGNOSIS — R45851 Suicidal ideations: Secondary | ICD-10-CM | POA: Diagnosis present

## 2015-02-09 DIAGNOSIS — F1310 Sedative, hypnotic or anxiolytic abuse, uncomplicated: Secondary | ICD-10-CM | POA: Insufficient documentation

## 2015-02-09 DIAGNOSIS — F1910 Other psychoactive substance abuse, uncomplicated: Secondary | ICD-10-CM

## 2015-02-09 DIAGNOSIS — Z720 Tobacco use: Secondary | ICD-10-CM | POA: Insufficient documentation

## 2015-02-09 LAB — COMPREHENSIVE METABOLIC PANEL
ALT: 11 U/L — ABNORMAL LOW (ref 14–54)
AST: 17 U/L (ref 15–41)
Albumin: 4.2 g/dL (ref 3.5–5.0)
Alkaline Phosphatase: 70 U/L (ref 38–126)
Anion gap: 8 (ref 5–15)
BUN: 24 mg/dL — ABNORMAL HIGH (ref 6–20)
CO2: 24 mmol/L (ref 22–32)
Calcium: 9.4 mg/dL (ref 8.9–10.3)
Chloride: 106 mmol/L (ref 101–111)
Creatinine, Ser: 0.95 mg/dL (ref 0.44–1.00)
GFR calc Af Amer: >60 mL/min (ref >60)
GFR calc non Af Amer: >60 mL/min (ref >60)
Glucose, Bld: 105 mg/dL — ABNORMAL HIGH (ref 65–99)
Potassium: 3.9 mmol/L (ref 3.5–5.1)
Sodium: 138 mmol/L (ref 135–145)
Total Bilirubin: 0.6 mg/dL (ref 0.3–1.2)
Total Protein: 7.9 g/dL (ref 6.5–8.1)

## 2015-02-09 LAB — CBC
HCT: 42.3 % (ref 36.0–46.0)
Hemoglobin: 14.4 g/dL (ref 12.0–15.0)
MCH: 31.9 pg (ref 26.0–34.0)
MCHC: 34 g/dL (ref 30.0–36.0)
MCV: 93.8 fL (ref 78.0–100.0)
Platelets: 164 10*3/uL (ref 150–400)
RBC: 4.51 MIL/uL (ref 3.87–5.11)
RDW: 13.4 % (ref 11.5–15.5)
WBC: 7.6 10*3/uL (ref 4.0–10.5)

## 2015-02-09 LAB — SALICYLATE LEVEL: Salicylate Lvl: <4 mg/dL (ref 2.8–30.0)

## 2015-02-09 LAB — RAPID URINE DRUG SCREEN, HOSP PERFORMED
Amphetamines: NOT DETECTED
Barbiturates: NOT DETECTED
Benzodiazepines: POSITIVE — AB
Cocaine: NOT DETECTED
Opiates: POSITIVE — AB
Tetrahydrocannabinol: NOT DETECTED

## 2015-02-09 LAB — ETHANOL: Alcohol, Ethyl (B): 173 mg/dL — ABNORMAL HIGH (ref <5)

## 2015-02-09 LAB — ACETAMINOPHEN LEVEL: Acetaminophen (Tylenol), Serum: <10 ug/mL — ABNORMAL LOW (ref 10–30)

## 2015-02-09 MED ORDER — PANTOPRAZOLE SODIUM 40 MG PO TBEC
40.0000 mg | DELAYED_RELEASE_TABLET | Freq: Every day | ORAL | Status: DC
  Filled 2015-02-09: qty 1

## 2015-02-09 MED ORDER — RIVAROXABAN 20 MG PO TABS
20.0000 mg | ORAL_TABLET | Freq: Every day | ORAL | Status: DC
  Filled 2015-02-09 (×2): qty 1

## 2015-02-09 MED ORDER — SPIRONOLACTONE 100 MG PO TABS
100.0000 mg | ORAL_TABLET | Freq: Every day | ORAL | Status: DC
  Filled 2015-02-09 (×2): qty 1

## 2015-02-09 MED ORDER — FUROSEMIDE 40 MG PO TABS
40.0000 mg | ORAL_TABLET | Freq: Every day | ORAL | Status: DC
  Filled 2015-02-09 (×2): qty 1

## 2015-02-09 MED ORDER — CYCLOBENZAPRINE HCL 10 MG PO TABS
10.0000 mg | ORAL_TABLET | Freq: Two times a day (BID) | ORAL | Status: DC | PRN
  Administered 2015-02-10: 10 mg via ORAL
  Filled 2015-02-09: qty 1

## 2015-02-09 MED ORDER — RIFAXIMIN 550 MG PO TABS
550.0000 mg | ORAL_TABLET | Freq: Two times a day (BID) | ORAL | Status: DC
  Administered 2015-02-10: 550 mg via ORAL
  Filled 2015-02-09 (×3): qty 1

## 2015-02-09 MED ORDER — PRAVASTATIN SODIUM 20 MG PO TABS
20.0000 mg | ORAL_TABLET | Freq: Every day | ORAL | Status: DC
  Filled 2015-02-09 (×3): qty 1

## 2015-02-09 MED ORDER — INSULIN GLARGINE 100 UNIT/ML ~~LOC~~ SOLN
23.0000 [IU] | Freq: Every day | SUBCUTANEOUS | Status: DC
  Administered 2015-02-10: 23 [IU] via SUBCUTANEOUS
  Filled 2015-02-09 (×2): qty 0.23

## 2015-02-09 MED ORDER — ALPRAZOLAM 1 MG PO TABS
1.0000 mg | ORAL_TABLET | Freq: Three times a day (TID) | ORAL | Status: DC | PRN
  Administered 2015-02-10 (×2): 1 mg via ORAL
  Filled 2015-02-09 (×2): qty 1

## 2015-02-09 MED ORDER — IBUPROFEN 200 MG PO TABS
600.0000 mg | ORAL_TABLET | Freq: Once | ORAL | Status: DC
  Filled 2015-02-09: qty 3
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
## 2015-02-09 NOTE — ED Notes (Signed)
Per EMS, pt from home, pt's daughter's boyfriend called 911.  Etoh on board, reports pt is SI, had a knife.  Pt reports she wanted to cut her wrists with the knife.  Reports that she had found out that her husband has been cheating on her when her mother died and had recently gone to a beach house with a prostitute. Pt is teary and gets loud and curses at staff at times and then apologizes after.

## 2015-02-09 NOTE — ED Notes (Signed)
Bed: [facility] Expected date:  Expected time:  Means of arrival:  Comments: EMS- ETOH/SI

## 2015-02-09 NOTE — ED Notes (Signed)
Staffing called regarding sitter case.  

## 2015-02-09 NOTE — ED Notes (Signed)
Pt urinated with staff before coming to room and did not collect a sample

## 2015-02-09 NOTE — ED Notes (Signed)
Unable to administer medication, pt not easily arousable

## 2015-02-09 NOTE — ED Notes (Signed)
Bed: [facility]10 Expected date:  Expected time:  Means of arrival:  Comments: Triage 3  

## 2015-02-09 NOTE — ED Notes (Signed)
Left attempted to stand by herself when triage chair tilted and pt fell to ground. Pt assisted into restroom with steady.

## 2015-02-09 NOTE — ED Provider Notes (Signed)
CSN: [id number]     Arrival date & time 02/09/15  1651 History   First MD Initiated Contact with Patient 02/09/15 1749     Chief Complaint  Patient presents with  . Suicidal  . Alcohol Intoxication     (Consider location/radiation/quality/duration/timing/severity/associated sxs/prior Treatment) HPI Comments: Patient arrives via EMS after they were called to the home.  This patient was threatening to cut herself with a knife.  She also is visually intoxicated.  She is being belligerent and uncooperative at this point. She is demanding oxycodone stating that she has been out of this medicine for approximately 3 weeks and gives a story that she had bedbugs then she quit the medicine in the garbage can and very convoluted story and why she is out of her medicine and that she had to have 3 forms filled out in  Court to get an appointment with her pain management doctor that will be in 3 weeks and she states she cannot wait 3 weeks to get her pain medicine.  She states she just wants to leave the ED to get drunk.   The history is provided by the patient and the EMS personnel.    Past Medical History  Diagnosis Date  . Diabetes mellitus   . Hypertension   . Anxiety   . Chronic pain   . Fibromyalgia   . Chronic neck pain   . Chronic back pain   . Hemochromatosis   . Alcoholic hepatitis   . Hepatic encephalopathy   . Thrombocytopenia   . Thrombus DVT   Past Surgical History  Procedure Laterality Date  . Rotator cuff repair     No family history on file. Social History  Substance Use Topics  . Smoking status: Current Every Day Smoker    Types: Cigarettes  . Smokeless tobacco: None  . Alcohol Use: No   OB History    No data available     Review of Systems  Constitutional: Negative for fever and chills.  Musculoskeletal: Positive for back pain.  Psychiatric/Behavioral: Positive for suicidal ideas and agitation.  All other systems reviewed and are negative.     Allergies   Penicillins and Codeine  Home Medications   Prior to Admission medications   Medication Sig Start Date End Date Taking? Authorizing Provider  ALPRAZolam Prudy Feeler) 1 MG tablet Take 1 tablet by mouth 3 (three) times daily as needed for anxiety.  07/14/14  Yes Historical Provider, MD  cyclobenzaprine (FLEXERIL) 10 MG tablet Take 10 mg by mouth every 12 (twelve) hours as needed for muscle spasms (muscle spasms).    Yes Historical Provider, MD  diphenhydrAMINE (BENADRYL) 25 MG tablet Take 25 mg by mouth every 8 (eight) hours as needed for allergies.   Yes Historical Provider, MD  furosemide (LASIX) 40 MG tablet Take 40 mg by mouth daily.   Yes Historical Provider, MD  insulin glargine (LANTUS) 100 UNIT/ML injection Inject 0.23 mLs (23 Units total) into the skin at bedtime. Patient taking differently: Inject 50 Units into the skin at bedtime.  01/06/14  Yes Marianne L York, PA-C  LANTUS SOLOSTAR 100 UNIT/ML Solostar Pen INJECT 50 TO 80 UNITS AND INCREASE BY 1 UNIT IF GREATER THAN 140 AND DECREASE BY 1 IF LESS THAN 70 12/29/14  Yes Historical Provider, MD  omeprazole (PRILOSEC) 20 MG capsule Take 20 mg by mouth daily.   Yes Historical Provider, MD  pravastatin (PRAVACHOL) 20 MG tablet Take 20 mg by mouth daily.  01/28/15  Yes Historical  Provider, MD  rifaximin (XIFAXAN) 550 MG TABS Take 550 mg by mouth 2 (two) times daily.   Yes Historical Provider, MD  rivaroxaban (XARELTO) 20 MG TABS tablet Take 20 mg by mouth daily.   Yes Historical Provider, MD  spironolactone (ALDACTONE) 100 MG tablet Take 100 mg by mouth daily.   Yes Historical Provider, MD  ALPRAZolam (XANAX) 0.25 MG tablet Take 1 tablet (0.25 mg total) by mouth 2 (two) times daily. Patient not taking: Reported on 08/25/2014 01/06/14   Stephani Police, PA-C  Oxycodone HCl (ROXICODONE) 10 MG TABS Take 1 tablet (10 mg total) by mouth every 6 (six) hours as needed for severe pain. Patient not taking: Reported on 02/09/2015 01/06/14   Stephani Police, PA-C   permethrin (ELIMITE) 5 % cream Apply to affected area once and repeat in 2 weeks. Patient not taking: Reported on 02/02/2015 08/26/14   Charlestine Night, PA-C   BP 95/65 mmHg  Pulse 94  Temp(Src) 98.8 F (37.1 C) (Oral)  Resp 24  SpO2 96% Physical Exam  Constitutional: She appears well-developed and well-nourished.  HENT:  Head: Normocephalic.  Eyes: Pupils are equal, round, and reactive to light.  Neck: Normal range of motion.  Cardiovascular: Normal rate and regular rhythm.   Pulmonary/Chest: Effort normal.  Musculoskeletal: Normal range of motion.  Neurological: She is alert.  Skin: No rash noted.  Psychiatric: Her affect is angry. Her speech is slurred. She is agitated and aggressive. Cognition and memory are impaired. She expresses impulsivity and inappropriate judgment. She exhibits a depressed mood. She expresses suicidal ideation. She expresses suicidal plans.  Nursing note and vitals reviewed.   ED Course  Procedures (including critical care time) Labs Review Labs Reviewed  COMPREHENSIVE METABOLIC PANEL - Abnormal; Notable for the following:    Glucose, Bld 105 (*)    BUN 24 (*)    ALT 11 (*)    All other components within normal limits  ETHANOL - Abnormal; Notable for the following:    Alcohol, Ethyl (B) 173 (*)    All other components within normal limits  ACETAMINOPHEN LEVEL - Abnormal; Notable for the following:    Acetaminophen (Tylenol), Serum <10 (*)    All other components within normal limits  URINE RAPID DRUG SCREEN, HOSP PERFORMED - Abnormal; Notable for the following:    Opiates POSITIVE (*)    Benzodiazepines POSITIVE (*)    All other components within normal limits  SALICYLATE LEVEL  CBC    Imaging Review No results found. I have personally reviewed and evaluated these images and lab results as part of my medical decision-making.   EKG Interpretation None     Report of threatening to kill himself with a knife by her family pediatrician is  being belligerent and uncooperative, demanding pain medicine and threatening to leave.  I feel that she is a threat to herself.  She is not safe to be in the community and I have initiated commitment papers] Patient has been evaluated.  IT TS and accepted for treatment at the [facility]   Final diagnoses:  Intoxication  Polysubstance abuse  Depression         Earley Favor, NP 02/09/15 1610  Nelva Nay, MD 02/09/15 [phone]

## 2015-02-09 NOTE — BH Assessment (Signed)
Reviewed ED notes prior to initiating assessment. Pt brought in after dtr's boyfriend call 911. Pt was threatening suicide and had a knife. Pt was assessed in ED in April for complaints of SI.   Assessment to commence shortly.    Clista Bernhardt, [facility] Triage Specialist 02/09/2015 7:35 PM

## 2015-02-10 DIAGNOSIS — F32A Depression, unspecified: Secondary | ICD-10-CM | POA: Insufficient documentation

## 2015-02-10 DIAGNOSIS — F329 Major depressive disorder, single episode, unspecified: Secondary | ICD-10-CM | POA: Insufficient documentation

## 2015-02-10 DIAGNOSIS — F1910 Other psychoactive substance abuse, uncomplicated: Secondary | ICD-10-CM | POA: Insufficient documentation

## 2015-02-10 DIAGNOSIS — F1020 Alcohol dependence, uncomplicated: Secondary | ICD-10-CM | POA: Diagnosis not present

## 2015-02-10 LAB — CBG MONITORING, ED: Glucose-Capillary: 91 mg/dL (ref 65–99)

## 2015-02-10 MED ORDER — ACETAMINOPHEN 325 MG PO TABS: 650.0000 mg | ORAL_TABLET | Freq: Four times a day (QID) | ORAL | Status: DC | PRN

## 2015-02-10 MED ORDER — IBUPROFEN 200 MG PO TABS
600.0000 mg | ORAL_TABLET | Freq: Three times a day (TID) | ORAL | Status: DC | PRN
  Administered 2015-02-10: 600 mg via ORAL
  Filled 2015-02-10 (×2): qty 3

## 2015-02-10 MED ORDER — RIVAROXABAN 20 MG PO TABS
20.0000 mg | ORAL_TABLET | Freq: Every day | ORAL | Status: DC
  Filled 2015-02-10 (×2): qty 1

## 2015-02-10 NOTE — BH Assessment (Addendum)
[facility] Assessment Progress Note  Per Thedore Mins, MD, this pt does not require psychiatric hospitalization at this time.  She is to be released from petition and discharged from [facility] without referrals.  IVC has been rescinded.  Pt's nurse has been notified.  Doylene Canning, MA Triage Specialist [phone]

## 2015-02-10 NOTE — Consult Note (Signed)
[facility] Consult   Reason for Consult:  Alcohol use disorder, Alcohol intoxication Referring Physician:  EDP Patient Identification: Katelyn Stevenson MRN:  [id number] Principal Diagnosis: Alcohol use disorder, moderate, dependence Diagnosis:   Patient Active Problem List   Diagnosis Date Noted  . Alcohol use disorder, moderate, dependence [F10.20] 02/10/2015    Priority: High  . DVT (deep venous thrombosis) [I82.409] 01/05/2014  . Altered mental status [R41.82] 01/04/2014  . Acute confusional state [F05] 01/04/2014  . Hypertension [I10]   . Fibromyalgia [M79.7]   . Hemochromatosis [E83.119]   . Alcoholic hepatitis [O87.86]     Total Time spent with patient: 1 hour  Subjective:   Katelyn Stevenson is a 59 y.o. female patient admitted with Alcohol use disorder Alcohol intoxication.Marland Kitchen  HPI:  Caucasian female, 59 years old was evaluated for voicing suicidal ideation after she drank 3-4 OZ Liquor.   Patient reports that she drank Alcohol after she could not receive her Narcotic Pain medications.  Patient reports that her Pain clinic providers refused to renew her pain medications.  Patient reports that she last drank Alcohol 8 years ago but started drinking when she could not get her pain medication.  Patient denied suicide feeling and stated that she was intoxicated when she stated she wanted to kill herself.   She stated"Alcohol was speaking for me"  Patient vehemently denied  SI/HI/AVH,  She denies ever seeing a mental health provider and have never taken any mental illness.  Patient is discharged home.   HPI Elements:   Location:  Alcohol use disorder, moderate use, Alcohol intoxication. Quality:  moderate-severe. Severity:  moderate-severe. Timing:  Acute. Duration:  sudden. Context:  seeking treatment for Alcohol intoxication..  Past Medical History:  Past Medical History  Diagnosis Date  . Diabetes mellitus   . Hypertension   . Anxiety   . Chronic pain   .  Fibromyalgia   . Chronic neck pain   . Chronic back pain   . Hemochromatosis   . Alcoholic hepatitis   . Hepatic encephalopathy   . Thrombocytopenia   . Thrombus DVT    Past Surgical History  Procedure Laterality Date  . Rotator cuff repair     Family History: No family history on file. Social History:  History  Alcohol Use No     History  Drug Use No    Social History   Social History  . Marital Status: Married    Spouse Name: N/A  . Number of Children: N/A  . Years of Education: N/A   Social History Main Topics  . Smoking status: Current Every Day Smoker    Types: Cigarettes  . Smokeless tobacco: None  . Alcohol Use: No  . Drug Use: No  . Sexual Activity: Not Asked   Other Topics Concern  . None   Social History Narrative   Additional Social History:    Pain Medications: See PTA, denies abuse, reports she ran out on 01-17-15 but still tested positive for opiates Prescriptions: See PTA, reprots takes as prescribed Over the Counter: See PTA  History of alcohol / drug use?: Yes Longest period of sobriety (when/how long): reports 10 years for etoh, denies hx of seizures Negative Consequences of Use:  (denies) Withdrawal Symptoms:  (none at this time) Name of Substance 1: etoh 1 - Age of First Use: teens 1 - Amount (size/oz): 1 gallon every couple of days 1 - Frequency: every 2-3 days 1 - Duration: years, but reports she had  stopped in her 15s due to liver failure and drank today due to pain condition  1 - Last Use / Amount: 02-09-15 a pint                    Allergies:   Allergies  Allergen Reactions  . Penicillins Anaphylaxis and Swelling  . Codeine Itching    Benadryl is given to combat symptoms    Labs:  Results for orders placed or performed during the hospital encounter of 02/09/15 (from the past 48 hour(s))  Comprehensive metabolic panel     Status: Abnormal   Collection Time: 02/09/15  5:18 PM  Result Value Ref Range   Sodium 138 135 -  145 mmol/L   Potassium 3.9 3.5 - 5.1 mmol/L   Chloride 106 101 - 111 mmol/L   CO2 24 22 - 32 mmol/L   Glucose, Bld 105 (H) 65 - 99 mg/dL   BUN 24 (H) 6 - 20 mg/dL   Creatinine, Ser 0.95 0.44 - 1.00 mg/dL   Calcium 9.4 8.9 - 10.3 mg/dL   Total Protein 7.9 6.5 - 8.1 g/dL   Albumin 4.2 3.5 - 5.0 g/dL   AST 17 15 - 41 U/L   ALT 11 (L) 14 - 54 U/L   Alkaline Phosphatase 70 38 - 126 U/L   Total Bilirubin 0.6 0.3 - 1.2 mg/dL   GFR calc non Af Amer >60 >60 mL/min   GFR calc Af Amer >60 >60 mL/min    Comment: (NOTE) The eGFR has been calculated using the CKD EPI equation. This calculation has not been validated in all clinical situations. eGFR's persistently <60 mL/min signify possible Chronic Kidney Disease.    Anion gap 8 5 - 15  Ethanol (ETOH)     Status: Abnormal   Collection Time: 02/09/15  5:18 PM  Result Value Ref Range   Alcohol, Ethyl (B) 173 (H) <5 mg/dL    Comment:        LOWEST DETECTABLE LIMIT FOR SERUM ALCOHOL IS 5 mg/dL FOR MEDICAL PURPOSES ONLY   Salicylate level     Status: None   Collection Time: 02/09/15  5:18 PM  Result Value Ref Range   Salicylate Lvl <3.3 2.8 - 30.0 mg/dL  Acetaminophen level     Status: Abnormal   Collection Time: 02/09/15  5:18 PM  Result Value Ref Range   Acetaminophen (Tylenol), Serum <10 (L) 10 - 30 ug/mL    Comment:        THERAPEUTIC CONCENTRATIONS VARY SIGNIFICANTLY. A RANGE OF 10-30 ug/mL MAY BE AN EFFECTIVE CONCENTRATION FOR MANY PATIENTS. HOWEVER, SOME ARE BEST TREATED AT CONCENTRATIONS OUTSIDE THIS RANGE. ACETAMINOPHEN CONCENTRATIONS >150 ug/mL AT 4 HOURS AFTER INGESTION AND >50 ug/mL AT 12 HOURS AFTER INGESTION ARE OFTEN ASSOCIATED WITH TOXIC REACTIONS.   CBC     Status: None   Collection Time: 02/09/15  5:18 PM  Result Value Ref Range   WBC 7.6 4.0 - 10.5 K/uL   RBC 4.51 3.87 - 5.11 MIL/uL   Hemoglobin 14.4 12.0 - 15.0 g/dL   HCT 42.3 36.0 - 46.0 %   MCV 93.8 78.0 - 100.0 fL   MCH 31.9 26.0 - 34.0 pg   MCHC  34.0 30.0 - 36.0 g/dL   RDW 13.4 11.5 - 15.5 %   Platelets 164 150 - 400 K/uL  Urine rapid drug screen (hosp performed)     Status: Abnormal   Collection Time: 02/09/15  6:08 PM  Result Value Ref Range  Opiates POSITIVE (A) NONE DETECTED   Cocaine NONE DETECTED NONE DETECTED   Benzodiazepines POSITIVE (A) NONE DETECTED   Amphetamines NONE DETECTED NONE DETECTED   Tetrahydrocannabinol NONE DETECTED NONE DETECTED   Barbiturates NONE DETECTED NONE DETECTED    Comment:        DRUG SCREEN FOR MEDICAL PURPOSES ONLY.  IF CONFIRMATION IS NEEDED FOR ANY PURPOSE, NOTIFY LAB WITHIN 5 DAYS.        LOWEST DETECTABLE LIMITS FOR URINE DRUG SCREEN Drug Class       Cutoff (ng/mL) Amphetamine      1000 Barbiturate      200 Benzodiazepine   465 Tricyclics       035 Opiates          300 Cocaine          300 THC              50   POC CBG, ED     Status: None   Collection Time: 02/10/15  8:04 AM  Result Value Ref Range   Glucose-Capillary 91 65 - 99 mg/dL   Comment 1 Document in Chart     Vitals: Blood pressure 107/69, pulse 82, temperature 98.5 F (36.9 C), temperature source Oral, resp. rate 18, SpO2 98 %.  Risk to Self: Suicidal Ideation: Yes-Currently Present Suicidal Intent: Yes-Currently Present Is patient at risk for suicide?: Yes Suicidal Plan?: Yes-Currently Present Specify Current Suicidal Plan: threatened to cut herself with pin from glasses, said she could [place] " think of something" Access to Means: Yes Specify Access to Suicidal Means: items to cut herself with  What has been your use of drugs/alcohol within the last 12 months?: Pt is intoxicated at time of interview but denies use of drugs, regular [place] of etoh or misuse of her medications How many times?: 0 Other Self Harm Risks: none Triggers for Past Attempts: None known Intentional Self Injurious Behavior: None Risk to Others: Homicidal Ideation: No Thoughts of Harm to Others: No Current Homicidal Intent:  No Current Homicidal Plan: No Access to Homicidal Means: No Identified Victim: none History of harm to others?: No Assessment of Violence: None Noted Violent Behavior Description: none Does patient have access to weapons?: No Criminal Charges Pending?: No Does patient have a court date: No Prior Inpatient Therapy: Prior Inpatient Therapy: No Prior Therapy Dates: NA Prior Therapy Facilty/Provider(s): NA Reason for Treatment: NA Prior Outpatient Therapy: Prior Outpatient Therapy: Yes Prior Therapy Dates: ongoing  Prior Therapy Facilty/Provider(s): pain clinic Reason for Treatment: medication management Does patient have an ACCT team?: No Does patient have Intensive In-House Services?  : No Does patient have [facility] services? : No Does patient have P4CC services?: No  Current Facility-Administered Medications  Medication Dose Route Frequency Provider Last Rate Last Dose  . acetaminophen (TYLENOL) tablet 650 mg  650 mg Oral Q6H PRN Jola Schmidt, MD      . ALPRAZolam Duanne Moron) tablet 1 mg  1 mg Oral TID PRN Junius Creamer, NP   1 mg at 02/10/15 0931  . cyclobenzaprine (FLEXERIL) tablet 10 mg  10 mg Oral Q12H PRN Junius Creamer, NP   10 mg at 02/10/15 0035  . furosemide (LASIX) tablet 40 mg  40 mg Oral Daily Junius Creamer, NP   40 mg at 02/10/15 0032  . ibuprofen (ADVIL,MOTRIN) tablet 600 mg  600 mg Oral Once Junius Creamer, NP   600 mg at 02/10/15 0031  . ibuprofen (ADVIL,MOTRIN) tablet 600 mg  600 mg Oral  Q8H PRN Jola Schmidt, MD   600 mg at 02/10/15 0456  . insulin glargine (LANTUS) injection 23 Units  23 Units Subcutaneous QHS Junius Creamer, NP   23 Units at 02/10/15 0034  . pantoprazole (PROTONIX) EC tablet 40 mg  40 mg Oral Daily Junius Creamer, NP   40 mg at 02/10/15 0036  . pravastatin (PRAVACHOL) tablet 20 mg  20 mg Oral Daily Junius Creamer, NP      . rifaximin Doreene Nest) tablet 550 mg  550 mg Oral BID Junius Creamer, NP   550 mg at 02/10/15 0035  . rivaroxaban (XARELTO) tablet 20 mg  20 mg Oral Q  breakfast Junius Creamer, NP      . spironolactone (ALDACTONE) tablet 100 mg  100 mg Oral Daily Junius Creamer, NP   100 mg at 02/10/15 0030   Current Outpatient Prescriptions  Medication Sig Dispense Refill  . ALPRAZolam (XANAX) 1 MG tablet Take 1 tablet by mouth 3 (three) times daily as needed for anxiety.   0  . cyclobenzaprine (FLEXERIL) 10 MG tablet Take 10 mg by mouth every 12 (twelve) hours as needed for muscle spasms (muscle spasms).     . diphenhydrAMINE (BENADRYL) 25 MG tablet Take 25 mg by mouth every 8 (eight) hours as needed for allergies.    . furosemide (LASIX) 40 MG tablet Take 40 mg by mouth daily.    . insulin glargine (LANTUS) 100 UNIT/ML injection Inject 0.23 mLs (23 Units total) into the skin at bedtime. (Patient taking differently: Inject 50 Units into the skin at bedtime. ) 10 mL 11  . LANTUS SOLOSTAR 100 UNIT/ML Solostar Pen INJECT 50 TO 80 UNITS AND INCREASE BY 1 UNIT IF GREATER THAN 140 AND DECREASE BY 1 IF LESS THAN 70  0  . omeprazole (PRILOSEC) 20 MG capsule Take 20 mg by mouth daily.    . pravastatin (PRAVACHOL) 20 MG tablet Take 20 mg by mouth daily.   0  . rifaximin (XIFAXAN) 550 MG TABS Take 550 mg by mouth 2 (two) times daily.    . rivaroxaban (XARELTO) 20 MG TABS tablet Take 20 mg by mouth daily.    Marland Kitchen spironolactone (ALDACTONE) 100 MG tablet Take 100 mg by mouth daily.    Marland Kitchen ALPRAZolam (XANAX) 0.25 MG tablet Take 1 tablet (0.25 mg total) by mouth 2 (two) times daily. (Patient not taking: Reported on 08/25/2014) 30 tablet 0  . Oxycodone HCl (ROXICODONE) 10 MG TABS Take 1 tablet (10 mg total) by mouth every 6 (six) hours as needed for severe pain. (Patient not taking: Reported on 02/09/2015) 30 tablet 0  . permethrin (ELIMITE) 5 % cream Apply to affected area once and repeat in 2 weeks. (Patient not taking: Reported on 02/02/2015) 60 g 0    Musculoskeletal: Strength & Muscle Tone: within normal limits Gait & Station: normal Patient leans: N/A  Psychiatric Specialty  Exam: Physical Exam  Review of Systems  Constitutional: Negative.   HENT: Negative.   Eyes: Negative.   Respiratory: Negative.   Cardiovascular: Negative.   Gastrointestinal: Negative.   Genitourinary: Negative.   Musculoskeletal:       Hx of chronic back, hip and neck pain  Skin: Negative.   Neurological: Negative.   Endo/Heme/Allergies: Negative.        Hx of diabetes    Blood pressure 107/69, pulse 82, temperature 98.5 F (36.9 C), temperature source Oral, resp. rate 18, SpO2 98 %.There is no weight on file to calculate BMI.  General Appearance: Casual  Eye Contact::  Good  Speech:  Clear and Coherent and Normal Rate  Volume:  Normal  Mood:  Euthymic  Affect:  Congruent  Thought Process:  Coherent, Goal Directed and Intact  Orientation:  Full (Time, Place, and Person)  Thought Content:  WDL  Suicidal Thoughts:  No  Homicidal Thoughts:  No  Memory:  Immediate;   Good Recent;   Good Remote;   Good  Judgement:  Fair  Insight:  Fair  Psychomotor Activity:  Normal  Concentration:  Good  Recall:  NA  Fund of Knowledge:Fair  Language: Good  Akathisia:  NA  Handed:  Right  AIMS (if indicated):     Assets:  Desire for Improvement  ADL's:  Intact  Cognition: WNL  Sleep:      Medical Decision Making: Established Problem, Stable/Improving (1)  Treatment Plan Summary:  Disposition: Discharge home  Delfin Gant   PMHNP-BC 02/10/2015 10:58 AM Patient seen face-to-face for psychiatric evaluation, chart reviewed and case discussed with the physician extender and developed treatment plan. Reviewed the information documented and agree with the treatment plan. Corena Pilgrim, MD

## 2015-02-10 NOTE — ED Notes (Signed)
Pt presents with SI, plan to cut self with knife or take eye glasses and use lens to cut self.  Denies HI or AV  Hallucinations, adamantly stating she is ready to be discharged home. Pt AAO x 3, no distress noted, anxious and cooperative.  Monitoring for safety, Q 15 min checks in effect.

## 2015-02-10 NOTE — ED Notes (Signed)
Pt discharged in wheelchair as she said she could not walk far.  [employer] called.  All belongings were returned to pt.  Pt was safe and in no distress at discharge.

## 2015-02-10 NOTE — [facility] Counselor (Signed)
Referral packet sent to [facility] Sours, Berton Lan, Turner Daniels, and [place] on 02/10/15 at 1:50 a.m. Shean K. Harris, MS, NCC, LCAS-A  Counselor 02/10/2015 2:15 AM

## 2015-02-10 NOTE — [facility] Suicide Risk Assessment (Cosign Needed)
Suicide Risk Assessment  Discharge Assessment   [facility] Discharge Suicide Risk Assessment   Demographic Factors:  Caucasian, Low socioeconomic status and Unemployed  Total Time spent with patient: 20 minutes  Musculoskeletal: Strength & Muscle Tone: within normal limits Gait & Station: normal Patient leans: N/A  Psychiatric Specialty Exam:     Blood pressure 121/66, pulse 103, temperature 98.3 F (36.8 C), temperature source Oral, resp. rate 18, SpO2 100 %.There is no weight on file to calculate BMI.  General Appearance: Casual  Eye Contact:: Good  Speech: Clear and Coherent and Normal Rate  Volume: Normal  Mood: Euthymic  Affect: Congruent  Thought Process: Coherent, Goal Directed and Intact  Orientation: Full (Time, Place, and Person)  Thought Content: WDL  Suicidal Thoughts: No  Homicidal Thoughts: No  Memory: Immediate; Good Recent; Good Remote; Good  Judgement: Fair  Insight: Fair  Psychomotor Activity: Normal  Concentration: Good  Recall: NA  Fund of Knowledge:Fair  Language: Good  Akathisia: NA  Handed: Right  AIMS (if indicated):    Assets: Desire for Improvement  ADL's: Intact  Cognition: WNL             Has this patient used any form of tobacco in the last 30 days? (Cigarettes, Smokeless Tobacco, Cigars, and/or Pipes) Yes, Prescription not provided because: Patient reports not smoking at the moment  Mental Status Per Nursing Assessment::   On Admission:     Current Mental Status by Physician: NA  Loss Factors: NA  Historical Factors: NA and denies  Risk Reduction Factors:   Sense of responsibility to family, Religious beliefs about death and Living with another person, especially a relative  Continued Clinical Symptoms:  Alcohol/Substance Abuse/Dependencies  Cognitive Features That Contribute To Risk:  Polarized thinking    Suicide Risk:  Minimal: No identifiable suicidal ideation.   Patients presenting with no risk factors but with morbid ruminations; may be classified as minimal risk based on the severity of the depressive symptoms  Principal Problem: Alcohol use disorder, moderate, dependence Discharge Diagnoses:  Patient Active Problem List   Diagnosis Date Noted  . Alcohol use disorder, moderate, dependence [F10.20] 02/10/2015    Priority: High  . Depression [F32.9]   . Polysubstance abuse [F19.10]   . DVT (deep venous thrombosis) [I82.409] 01/05/2014  . Altered mental status [R41.82] 01/04/2014  . Acute confusional state [F05] 01/04/2014  . Hypertension [I10]   . Fibromyalgia [M79.7]   . Hemochromatosis [E83.119]   . Alcoholic hepatitis [K70.10]       Plan Of Care/Follow-up recommendations:  Activity:  as tolerated Diet:  Regular  Is patient on multiple antipsychotic therapies at discharge:  No   Has Patient had three or more failed trials of antipsychotic monotherapy by history:  No  Recommended Plan for Multiple Antipsychotic Therapies: NA    Katelyn Stevenson C    PMHNP-BC 02/10/2015, 11:31 AM

## 2015-03-23 ENCOUNTER — Ambulatory Visit: Payer: Self-pay

## 2015-08-13 ENCOUNTER — Encounter (HOSPITAL_COMMUNITY): Payer: Self-pay

## 2015-08-13 DIAGNOSIS — I10 Essential (primary) hypertension: Secondary | ICD-10-CM | POA: Insufficient documentation

## 2015-08-13 DIAGNOSIS — G8929 Other chronic pain: Secondary | ICD-10-CM | POA: Diagnosis not present

## 2015-08-13 DIAGNOSIS — E119 Type 2 diabetes mellitus without complications: Secondary | ICD-10-CM | POA: Diagnosis not present

## 2015-08-13 DIAGNOSIS — I82402 Acute embolism and thrombosis of unspecified deep veins of left lower extremity: Secondary | ICD-10-CM | POA: Diagnosis not present

## 2015-08-13 DIAGNOSIS — F17210 Nicotine dependence, cigarettes, uncomplicated: Secondary | ICD-10-CM | POA: Insufficient documentation

## 2015-08-13 NOTE — ED Notes (Signed)
Pt states she is unable to afford her xarelto. Last dose 5 days ago. Her LLE is swollen and she is concerned about having a DVT because she has had one before. She went to [facility] earlier today and was told to come here. She also reports back pain.

## 2015-08-14 ENCOUNTER — Emergency Department (HOSPITAL_COMMUNITY)
Admission: EM | Admit: 2015-08-14 | Discharge: 2015-08-14 | Disposition: A | Discharge status: Home / Self Care | Payer: BLUE CROSS/BLUE SHIELD | Attending: Emergency Medicine | Admitting: Emergency Medicine

## 2015-08-14 ENCOUNTER — Emergency Department (HOSPITAL_BASED_OUTPATIENT_CLINIC_OR_DEPARTMENT_OTHER)
Admit: 2015-08-14 | Discharge: 2015-08-14 | Disposition: A | Discharge status: Home / Self Care | Payer: BLUE CROSS/BLUE SHIELD | Attending: Emergency Medicine | Admitting: Emergency Medicine

## 2015-08-14 ENCOUNTER — Encounter (HOSPITAL_COMMUNITY): Payer: Self-pay

## 2015-08-14 DIAGNOSIS — R609 Edema, unspecified: Secondary | ICD-10-CM

## 2015-08-14 DIAGNOSIS — G8929 Other chronic pain: Secondary | ICD-10-CM | POA: Insufficient documentation

## 2015-08-14 DIAGNOSIS — Z760 Encounter for issue of repeat prescription: Secondary | ICD-10-CM | POA: Insufficient documentation

## 2015-08-14 DIAGNOSIS — Z86718 Personal history of other venous thrombosis and embolism: Secondary | ICD-10-CM | POA: Insufficient documentation

## 2015-08-14 DIAGNOSIS — R600 Localized edema: Secondary | ICD-10-CM | POA: Insufficient documentation

## 2015-08-14 DIAGNOSIS — I10 Essential (primary) hypertension: Secondary | ICD-10-CM | POA: Insufficient documentation

## 2015-08-14 DIAGNOSIS — L03116 Cellulitis of left lower limb: Secondary | ICD-10-CM | POA: Insufficient documentation

## 2015-08-14 DIAGNOSIS — Z79899 Other long term (current) drug therapy: Secondary | ICD-10-CM | POA: Insufficient documentation

## 2015-08-14 DIAGNOSIS — Z880 Allergy status to penicillin: Secondary | ICD-10-CM | POA: Insufficient documentation

## 2015-08-14 DIAGNOSIS — Z792 Long term (current) use of antibiotics: Secondary | ICD-10-CM | POA: Insufficient documentation

## 2015-08-14 DIAGNOSIS — Z8719 Personal history of other diseases of the digestive system: Secondary | ICD-10-CM | POA: Insufficient documentation

## 2015-08-14 DIAGNOSIS — F17210 Nicotine dependence, cigarettes, uncomplicated: Secondary | ICD-10-CM | POA: Insufficient documentation

## 2015-08-14 DIAGNOSIS — Z862 Personal history of diseases of the blood and blood-forming organs and certain disorders involving the immune mechanism: Secondary | ICD-10-CM | POA: Insufficient documentation

## 2015-08-14 DIAGNOSIS — Z8659 Personal history of other mental and behavioral disorders: Secondary | ICD-10-CM | POA: Insufficient documentation

## 2015-08-14 DIAGNOSIS — Z7901 Long term (current) use of anticoagulants: Secondary | ICD-10-CM | POA: Insufficient documentation

## 2015-08-14 DIAGNOSIS — E119 Type 2 diabetes mellitus without complications: Secondary | ICD-10-CM | POA: Insufficient documentation

## 2015-08-14 MED ORDER — GLIMEPIRIDE 1 MG PO TABS: 1.0000 mg | ORAL_TABLET | Freq: Every day | ORAL | Status: DC

## 2015-08-14 MED ORDER — RIVAROXABAN 20 MG PO TABS: 20.0000 mg | ORAL_TABLET | Freq: Every day | ORAL | Status: DC

## 2015-08-14 MED ORDER — METFORMIN HCL 1000 MG PO TABS: 1000.0000 mg | ORAL_TABLET | Freq: Two times a day (BID) | ORAL | Status: DC

## 2015-08-14 MED ORDER — ENOXAPARIN SODIUM 120 MG/0.8ML ~~LOC~~ SOLN
1.0000 mg/kg | Freq: Once | SUBCUTANEOUS | Status: AC
  Administered 2015-08-14: 115 mg via SUBCUTANEOUS
  Filled 2015-08-14: qty 0.8

## 2015-08-14 MED ORDER — CLINDAMYCIN HCL 150 MG PO CAPS: 450.0000 mg | ORAL_CAPSULE | Freq: Three times a day (TID) | ORAL | Status: DC

## 2015-08-14 NOTE — Care Management (Signed)
ED CM met patient and husband regarding medication assistance. Patient is without insurance at this time.  Discussed [place] program, patient is agreeable with plan Patient enrolled in [facility] program, letter printed with instructions and given to patient. Teach back done patient verbalized understanding. No further questions or concerns verbalized. No further CM needs identified.

## 2015-08-14 NOTE — Discharge Instructions (Signed)
1. Medications: clindamycin, Xarelto, Metformin, Amaryl, usual home medications 2. Treatment: rest, drink plenty of fluids,  3. Follow Up: Please followup with your primary doctor in 2-3 days for discussion of your diagnoses and further evaluation after today's visit; if you do not have a primary care doctor use the resource guide provided to find one; Please return to the ER for chest pain, shortness of breath or other concerns    Cellulitis Cellulitis is an infection of the skin and the tissue beneath it. The infected area is usually red and tender. Cellulitis occurs most often in the arms and lower legs.  CAUSES  Cellulitis is caused by bacteria that enter the skin through cracks or cuts in the skin. The most common types of bacteria that cause cellulitis are staphylococci and streptococci. SIGNS AND SYMPTOMS   Redness and warmth.  Swelling.  Tenderness or pain.  Fever. DIAGNOSIS  Your health care provider can usually determine what is wrong based on a physical exam. Blood tests may also be done. TREATMENT  Treatment usually involves taking an antibiotic medicine. HOME CARE INSTRUCTIONS   Take your antibiotic medicine as directed by your health care provider. Finish the antibiotic even if you start to feel better.  Keep the infected arm or leg elevated to reduce swelling.  Apply a warm cloth to the affected area up to 4 times per day to relieve pain.  Take medicines only as directed by your health care provider.  Keep all follow-up visits as directed by your health care provider. SEEK MEDICAL CARE IF:   You notice red streaks coming from the infected area.  Your red area gets larger or turns dark in color.  Your bone or joint underneath the infected area becomes painful after the skin has healed.  Your infection returns in the same area or another area.  You notice a swollen bump in the infected area.  You develop new symptoms.  You have a fever. SEEK IMMEDIATE  MEDICAL CARE IF:   You feel very sleepy.  You develop vomiting or diarrhea.  You have a general ill feeling (malaise) with muscle aches and pains.   This information is not intended to replace advice given to you by your health care provider. Make sure you discuss any questions you have with your health care provider.   Document Released: 02/16/2005 Document Revised: 01/28/2015 Document Reviewed: 07/25/2011 Elsevier Interactive Patient Education [employer]2016 [employer]

## 2015-08-14 NOTE — ED Notes (Signed)
Pt states she is unable to afford her xarelto. Last dose 5 days ago. Her LLE is swollen and she is concerned about having a DVT because she has had one before. She came yesterday for same complaint but LWBS after triage.

## 2015-08-14 NOTE — ED Provider Notes (Signed)
CSN: [id number]     Arrival date & time 08/14/15  1636 History   First MD Initiated Contact with Patient 08/14/15 2019     Chief Complaint  Patient presents with  . Leg Swelling     (Consider location/radiation/quality/duration/timing/severity/associated sxs/prior Treatment) The history is provided by the patient and medical records. No language interpreter was used.   Katelyn Stevenson is a 60 y.o. female  with a hx of diabetes, hypertension, chronic pain, alcoholic hepatitis, thrombocytopenia, DVT presents to the Emergency Department complaining of gradual, persistent, progressively worsening bilateral lower leg swelling and erythema, left worse than right onset 3 days ago. Patient reports she has been unable to afford her Xarelto, with her last dose being 5 days ago. She reports a previous history of DVT. She reports right calf pain with palpation but more swelling with the left leg.  She denies fever, chills, headache, neck pain, chest pain, shortness of breath, abdominal pain, nausea, vomiting, diarrhea, weakness, dizziness, syncope, dysuria. No treatments prior to arrival. Nothing makes the symptoms better or worse.    Past Medical History  Diagnosis Date  . Diabetes mellitus   . Hypertension   . Anxiety   . Chronic pain   . Fibromyalgia   . Chronic neck pain   . Chronic back pain   . Hemochromatosis   . Alcoholic hepatitis   . Hepatic encephalopathy (HCC)   . Thrombocytopenia (HCC)   . Thrombus DVT   Past Surgical History  Procedure Laterality Date  . Rotator cuff repair     No family history on file. Social History  Substance Use Topics  . Smoking status: Current Every Day Smoker    Types: Cigarettes  . Smokeless tobacco: None  . Alcohol Use: No   OB History    No data available     Review of Systems  Constitutional: Negative for fever, diaphoresis, appetite change, fatigue and unexpected weight change.  HENT: Negative for mouth sores.   Eyes: Negative for visual  disturbance.  Respiratory: Negative for cough, chest tightness, shortness of breath and wheezing.   Cardiovascular: Positive for leg swelling. Negative for chest pain.  Gastrointestinal: Negative for nausea, vomiting, abdominal pain, diarrhea and constipation.  Endocrine: Negative for polydipsia, polyphagia and polyuria.  Genitourinary: Negative for dysuria, urgency, frequency and hematuria.  Musculoskeletal: Positive for arthralgias (bilateral lower legs). Negative for back pain and neck stiffness.  Skin: Positive for color change. Negative for rash.  Allergic/Immunologic: Negative for immunocompromised state.  Neurological: Negative for syncope, light-headedness and headaches.  Hematological: Does not bruise/bleed easily.  Psychiatric/Behavioral: Negative for sleep disturbance. The patient is not nervous/anxious.       Allergies  Penicillins and Codeine  Home Medications   Prior to Admission medications   Medication Sig Start Date End Date Taking? Authorizing Provider  furosemide (LASIX) 40 MG tablet Take 40 mg by mouth daily.   Yes Historical Provider, MD  omeprazole (PRILOSEC) 20 MG capsule Take 20 mg by mouth daily.   Yes Historical Provider, MD  pravastatin (PRAVACHOL) 20 MG tablet Take 20 mg by mouth daily.  01/28/15  Yes Historical Provider, MD  rifaximin (XIFAXAN) 550 MG TABS Take 550 mg by mouth 2 (two) times daily.   Yes Historical Provider, MD  spironolactone (ALDACTONE) 100 MG tablet Take 100 mg by mouth daily.   Yes Historical Provider, MD  clindamycin (CLEOCIN) 150 MG capsule Take 3 capsules (450 mg total) by mouth 3 (three) times daily. 08/14/15   Dahlia Client Jamey Harman, PA-C  glimepiride (AMARYL) 1 MG tablet Take 1 tablet (1 mg total) by mouth daily. 08/14/15   Estefanny Moler, PA-C  metFORMIN (GLUCOPHAGE) 1000 MG tablet Take 1 tablet (1,000 mg total) by mouth 2 (two) times daily with a meal. 08/14/15   Nesbit Michon, PA-C  rivaroxaban (XARELTO) 20 MG TABS tablet  Take 1 tablet (20 mg total) by mouth daily. 08/14/15   Odyssey Vasbinder, PA-C   BP 109/75 mmHg  Pulse 88  Temp(Src) 98.3 F (36.8 C) (Oral)  Resp 18  SpO2 100% Physical Exam  Constitutional: She appears well-developed and well-nourished. No distress.  Awake, alert, nontoxic appearance  HENT:  Head: Normocephalic and atraumatic.  Mouth/Throat: Oropharynx is clear and moist. No oropharyngeal exudate.  Eyes: Conjunctivae are normal. No scleral icterus.  Neck: Normal range of motion. Neck supple.  Cardiovascular: Normal rate, regular rhythm and intact distal pulses.   Pulses:      Radial pulses are 2+ on the right side, and 2+ on the left side.       Dorsalis pedis pulses are 1+ on the right side.  DP pulses 1+ in the right and not palpable in the left but found with Doppler to be strong  Pulmonary/Chest: Effort normal and breath sounds normal. No tachypnea. No respiratory distress. She has no wheezes.  Equal chest expansion Clear and equal breath sounds  Abdominal: Soft. Bowel sounds are normal. She exhibits no mass. There is no tenderness. There is no rebound and no guarding.  Musculoskeletal: Normal range of motion. She exhibits edema.  Bilateral nonpitting edema of the lower legs from proximal ankle to proximal calf; left more swollen than right Left also more erythematous and with increased warmth, no induration or evidence of abscess; mild tenderness to palpation along the anterior left leg Right leg with tenderness to palpation of the calf, some nonpitting edema but less than the left leg, minimal erythema and no increased warmth or induration.  Neurological: She is alert.  Speech is clear and goal oriented Moves extremities without ataxia  Skin: Skin is warm and dry. She is not diaphoretic.  Psychiatric: She has a normal mood and affect.  Nursing note and vitals reviewed.   ED Course  Procedures (including critical care time)  VASCULAR LAB PRELIMINARY PRELIMINARY  PRELIMINARY PRELIMINARY  Left lower extremity venous duplex completed.   Left: No evidence of DVT, superficial thrombosis, or Baker's cyst.  MDM   Final diagnoses:  Peripheral edema  Cellulitis of left lower leg  Long term (current) use of anticoagulants  Medication refill   Katelyn Stevenson presents with bilateral lower leg swelling.  Venous duplex completed at the left lower extremity without evidence of DVT, superficial thrombosis or Baker cyst. Her exam shows some erythema and increased warmth of the left lower leg concerning for mild cellulitis.  Will be on clindamycin. Patient's right leg has calf tenderness.  We'll plan for venous duplex in the a.m.  Patient has been seen by case management who will assist with her medications including Xarelto, metformin, glyburide and clindamycin.  Patient has been given one dose of Lovenox here in the emergency department tonight.  Highly doubt PE as patient is without chest pain, shortness of breath or tachycardia.  Discussed findings with patient and husband. They state understanding. Discussed reasons to return immediately to the emergency department including chest pain, shortness of breath, worsening infection or other concerns.  Dahlia ClientHannah Clay Solum, PA-C 08/15/15 0115  Benjiman CoreNathan Pickering, MD 08/15/15 2206

## 2015-08-14 NOTE — Progress Notes (Signed)
VASCULAR LAB PRELIMINARY  PRELIMINARY  PRELIMINARY  PRELIMINARY  Left lower extremity venous duplex completed.    Left:  No evidence of DVT, superficial thrombosis, or Baker's cyst.   Katelyn Stevenson, RVT, RDMS 08/14/2015, 6:20 PM

## 2015-08-16 ENCOUNTER — Telehealth: Payer: Self-pay | Admitting: *Deleted

## 2019-04-19 ENCOUNTER — Emergency Department (HOSPITAL_COMMUNITY)
Admission: EM | Admit: 2019-04-19 | Discharge: 2019-04-19 | Disposition: A | Discharge status: Home / Self Care | Payer: Self-pay | Attending: Emergency Medicine | Admitting: Emergency Medicine

## 2019-04-19 ENCOUNTER — Other Ambulatory Visit: Payer: Self-pay

## 2019-04-19 ENCOUNTER — Encounter (HOSPITAL_COMMUNITY): Payer: Self-pay | Admitting: Emergency Medicine

## 2019-04-19 DIAGNOSIS — Y929 Unspecified place or not applicable: Secondary | ICD-10-CM | POA: Insufficient documentation

## 2019-04-19 DIAGNOSIS — S51801A Unspecified open wound of right forearm, initial encounter: Secondary | ICD-10-CM | POA: Insufficient documentation

## 2019-04-19 DIAGNOSIS — T148XXA Other injury of unspecified body region, initial encounter: Secondary | ICD-10-CM

## 2019-04-19 DIAGNOSIS — X58XXXA Exposure to other specified factors, initial encounter: Secondary | ICD-10-CM | POA: Insufficient documentation

## 2019-04-19 DIAGNOSIS — Y999 Unspecified external cause status: Secondary | ICD-10-CM | POA: Insufficient documentation

## 2019-04-19 DIAGNOSIS — L089 Local infection of the skin and subcutaneous tissue, unspecified: Secondary | ICD-10-CM | POA: Insufficient documentation

## 2019-04-19 DIAGNOSIS — F17210 Nicotine dependence, cigarettes, uncomplicated: Secondary | ICD-10-CM | POA: Insufficient documentation

## 2019-04-19 DIAGNOSIS — Z7901 Long term (current) use of anticoagulants: Secondary | ICD-10-CM | POA: Insufficient documentation

## 2019-04-19 DIAGNOSIS — I10 Essential (primary) hypertension: Secondary | ICD-10-CM | POA: Insufficient documentation

## 2019-04-19 DIAGNOSIS — E119 Type 2 diabetes mellitus without complications: Secondary | ICD-10-CM | POA: Insufficient documentation

## 2019-04-19 DIAGNOSIS — Y939 Activity, unspecified: Secondary | ICD-10-CM | POA: Insufficient documentation

## 2019-04-19 MED ORDER — DOXYCYCLINE HYCLATE 100 MG PO CAPS: 100.0000 mg | ORAL_CAPSULE | Freq: Two times a day (BID) | ORAL | 0 refills | Status: DC

## 2019-04-19 MED ORDER — SULFAMETHOXAZOLE-TRIMETHOPRIM 800-160 MG PO TABS: 1.0000 | ORAL_TABLET | Freq: Once | ORAL | Status: DC

## 2019-04-19 MED ORDER — DOXYCYCLINE HYCLATE 100 MG PO TABS
100.0000 mg | ORAL_TABLET | Freq: Once | ORAL | Status: AC
  Administered 2019-04-19: 100 mg via ORAL
  Filled 2019-04-19: qty 1

## 2019-04-19 NOTE — ED Provider Notes (Signed)
[facility] EMERGENCY DEPARTMENT Provider Note   CSN: [id number] Arrival date & time: 04/19/19  2706     History   Chief Complaint No chief complaint on file.   HPI Katelyn Stevenson is a 63 y.o. female.     Pt reports she thinks something bit her right arm.  Pt reports area has been swollen and now draining.   The history is provided by the patient. No language interpreter was used.  Arm Injury Location:  Arm Arm location:  R forearm Pain details:    Quality:  Aching   Radiates to:  Does not radiate   Severity:  Mild Prior injury to area:  No Relieved by:  Nothing Worsened by:  Nothing   Past Medical History:  Diagnosis Date  . Alcoholic hepatitis   . Anxiety   . Chronic back pain   . Chronic neck pain   . Chronic pain   . Diabetes mellitus   . Fibromyalgia   . Hemochromatosis   . Hepatic encephalopathy ([place])   . Hypertension   . Thrombocytopenia ([place])   . Thrombus DVT    Patient Active Problem List   Diagnosis Date Noted  . Alcohol use disorder, moderate, dependence ([place]) 02/10/2015  . Depression   . Polysubstance abuse ([place])   . DVT (deep venous thrombosis) ([place]) 01/05/2014  . Altered mental status 01/04/2014  . Acute confusional state 01/04/2014  . Hypertension   . Fibromyalgia   . Hemochromatosis   . Alcoholic hepatitis     Past Surgical History:  Procedure Laterality Date  . ROTATOR CUFF REPAIR       OB History   No obstetric history on file.      Home Medications    Prior to Admission medications   Medication Sig Start Date End Date Taking? Authorizing Provider  clindamycin (CLEOCIN) 150 MG capsule Take 3 capsules (450 mg total) by mouth 3 (three) times daily. 08/14/15   Muthersbaugh, Jarrett Soho, PA-C  doxycycline (VIBRAMYCIN) 100 MG capsule Take 1 capsule (100 mg total) by mouth 2 (two) times daily. 04/19/19   Fransico Meadow, PA-C  furosemide (LASIX) 40 MG tablet Take 40 mg by mouth daily.    [provider]   glimepiride (AMARYL) 1 MG tablet Take 1 tablet (1 mg total) by mouth daily. 08/14/15   Muthersbaugh, Jarrett Soho, PA-C  metFORMIN (GLUCOPHAGE) 1000 MG tablet Take 1 tablet (1,000 mg total) by mouth 2 (two) times daily with a meal. 08/14/15   Muthersbaugh, Jarrett Soho, PA-C  omeprazole (PRILOSEC) 20 MG capsule Take 20 mg by mouth daily.    [provider]  pravastatin (PRAVACHOL) 20 MG tablet Take 20 mg by mouth daily.  01/28/15   [provider]  rifaximin (XIFAXAN) 550 MG TABS Take 550 mg by mouth 2 (two) times daily.    [provider]  rivaroxaban (XARELTO) 20 MG TABS tablet Take 1 tablet (20 mg total) by mouth daily. 08/14/15   Muthersbaugh, Jarrett Soho, PA-C  spironolactone (ALDACTONE) 100 MG tablet Take 100 mg by mouth daily.    [provider]    Family History No family history on file.  Social History Social History   Tobacco Use  . Smoking status: Current Every Day Smoker    Types: Cigarettes  . Smokeless tobacco: Never Used  Substance Use Topics  . Alcohol use: No  . Drug use: No     Allergies   Penicillins and Codeine   Review of Systems Review of Systems  All other systems reviewed and are negative.    Physical Exam Updated Vital Signs BP 134/85 (BP Location: Left Arm)   Pulse 86   Temp 98.6 F (37 C) (Oral)   Resp 17   SpO2 99%   Physical Exam Vitals signs and nursing note reviewed.  Constitutional:      Appearance: She is well-developed.  HENT:     Head: Normocephalic.     Nose: Nose normal.     Mouth/Throat:     Mouth: Mucous membranes are moist.  Eyes:     Pupils: Pupils are equal, round, and reactive to light.  Neck:     Musculoskeletal: Normal range of motion.  Cardiovascular:     Rate and Rhythm: Normal rate.  Pulmonary:     Effort: Pulmonary effort is normal.  Abdominal:     General: There is no distension.  Musculoskeletal: Normal range of motion.        General: Swelling and tenderness present.     Comments: 1cm  open wound arm,  Small amount of drainage from wound on right forearm  Neurological:     General: No focal deficit present.     Mental Status: She is alert and oriented to person, place, and time.  Psychiatric:        Mood and Affect: Mood normal.      ED Treatments / Results  Labs (all labs ordered are listed, but only abnormal results are displayed) Labs Reviewed - No data to display  EKG None  Radiology No results found.  Procedures Procedures (including critical care time)  Medications Ordered in ED Medications  doxycycline (VIBRA-TABS) tablet 100 mg (100 mg Oral Given 04/19/19 1044)     Initial Impression / Assessment and Plan / ED Course  I have reviewed the triage vital signs and the nursing notes.  Pertinent labs & imaging results that were available during my care of the patient were reviewed by me and considered in my medical decision making (see chart for details).          Final Clinical Impressions(s) / ED Diagnoses   Final diagnoses:  Wound infection    ED Discharge Orders         Ordered    doxycycline (VIBRAMYCIN) 100 MG capsule  2 times daily     04/19/19 1024        An After Visit Summary was printed and given to the patient.    Elson Areas, PA-C 04/19/19 1816    Cathren Laine, MD 04/22/19 [phone]

## 2019-04-19 NOTE — Discharge Instructions (Signed)
Follow up with your Physician next week if symptoms persist

## 2019-04-19 NOTE — ED Triage Notes (Signed)
Has a bite mark rt lower arm  X 3 days  States she scratched it  Now it hurts

## 2020-08-06 ENCOUNTER — Emergency Department (HOSPITAL_COMMUNITY): Payer: Medicare Other

## 2020-08-06 ENCOUNTER — Other Ambulatory Visit: Payer: Self-pay

## 2020-08-06 ENCOUNTER — Emergency Department (HOSPITAL_COMMUNITY)
Admission: EM | Admit: 2020-08-06 | Discharge: 2020-08-06 | Disposition: A | Discharge status: Home / Self Care | Payer: Medicare Other | Attending: Emergency Medicine | Admitting: Emergency Medicine

## 2020-08-06 ENCOUNTER — Encounter (HOSPITAL_COMMUNITY): Payer: Self-pay

## 2020-08-06 DIAGNOSIS — M25561 Pain in right knee: Secondary | ICD-10-CM | POA: Insufficient documentation

## 2020-08-06 DIAGNOSIS — S80212A Abrasion, left knee, initial encounter: Secondary | ICD-10-CM | POA: Diagnosis not present

## 2020-08-06 DIAGNOSIS — S82141A Displaced bicondylar fracture of right tibia, initial encounter for closed fracture: Secondary | ICD-10-CM

## 2020-08-06 DIAGNOSIS — Y92007 Garden or yard of unspecified non-institutional (private) residence as the place of occurrence of the external cause: Secondary | ICD-10-CM | POA: Diagnosis not present

## 2020-08-06 DIAGNOSIS — L0201 Cutaneous abscess of face: Secondary | ICD-10-CM | POA: Diagnosis not present

## 2020-08-06 DIAGNOSIS — S8991XA Unspecified injury of right lower leg, initial encounter: Secondary | ICD-10-CM | POA: Diagnosis present

## 2020-08-06 DIAGNOSIS — Z79899 Other long term (current) drug therapy: Secondary | ICD-10-CM | POA: Insufficient documentation

## 2020-08-06 DIAGNOSIS — M549 Dorsalgia, unspecified: Secondary | ICD-10-CM | POA: Diagnosis not present

## 2020-08-06 DIAGNOSIS — S82201A Unspecified fracture of shaft of right tibia, initial encounter for closed fracture: Secondary | ICD-10-CM | POA: Diagnosis not present

## 2020-08-06 DIAGNOSIS — M542 Cervicalgia: Secondary | ICD-10-CM | POA: Insufficient documentation

## 2020-08-06 DIAGNOSIS — S0083XA Contusion of other part of head, initial encounter: Secondary | ICD-10-CM

## 2020-08-06 DIAGNOSIS — E119 Type 2 diabetes mellitus without complications: Secondary | ICD-10-CM | POA: Diagnosis not present

## 2020-08-06 DIAGNOSIS — Z7901 Long term (current) use of anticoagulants: Secondary | ICD-10-CM | POA: Insufficient documentation

## 2020-08-06 DIAGNOSIS — Z7984 Long term (current) use of oral hypoglycemic drugs: Secondary | ICD-10-CM | POA: Insufficient documentation

## 2020-08-06 LAB — CBC WITH DIFFERENTIAL/PLATELET
Abs Immature Granulocytes: 0.05 10*3/uL (ref 0.00–0.07)
Basophils Absolute: 0 10*3/uL (ref 0.0–0.1)
Basophils Relative: 0 %
Eosinophils Absolute: 0.1 10*3/uL (ref 0.0–0.5)
Eosinophils Relative: 1 %
HCT: 39.8 % (ref 36.0–46.0)
Hemoglobin: 13 g/dL (ref 12.0–15.0)
Immature Granulocytes: 1 %
Lymphocytes Relative: 8 %
Lymphs Abs: 0.6 10*3/uL — ABNORMAL LOW (ref 0.7–4.0)
MCH: 32 pg (ref 26.0–34.0)
MCHC: 32.7 g/dL (ref 30.0–36.0)
MCV: 98 fL (ref 80.0–100.0)
Monocytes Absolute: 0.4 10*3/uL (ref 0.1–1.0)
Monocytes Relative: 6 %
Neutro Abs: 6.4 10*3/uL (ref 1.7–7.7)
Neutrophils Relative %: 84 %
Platelets: 251 10*3/uL (ref 150–400)
RBC: 4.06 MIL/uL (ref 3.87–5.11)
RDW: 13.5 % (ref 11.5–15.5)
WBC: 7.5 10*3/uL (ref 4.0–10.5)
nRBC: 0 % (ref 0.0–0.2)

## 2020-08-06 LAB — BASIC METABOLIC PANEL
Anion gap: 9 (ref 5–15)
BUN: 15 mg/dL (ref 8–23)
CO2: 24 mmol/L (ref 22–32)
Calcium: 9.2 mg/dL (ref 8.9–10.3)
Chloride: 104 mmol/L (ref 98–111)
Creatinine, Ser: 0.74 mg/dL (ref 0.44–1.00)
GFR, Estimated: >60 mL/min (ref >60)
Glucose, Bld: 104 mg/dL — ABNORMAL HIGH (ref 70–99)
Potassium: 4.3 mmol/L (ref 3.5–5.1)
Sodium: 137 mmol/L (ref 135–145)

## 2020-08-06 LAB — PROTIME-INR
INR: 1 (ref 0.8–1.2)
Prothrombin Time: 13.2 seconds (ref 11.4–15.2)

## 2020-08-06 LAB — ETHANOL: Alcohol, Ethyl (B): <10 mg/dL (ref <10)

## 2020-08-06 MED ORDER — CLINDAMYCIN HCL 300 MG PO CAPS: 300.0000 mg | ORAL_CAPSULE | Freq: Four times a day (QID) | ORAL | 0 refills | Status: DC

## 2020-08-06 MED ORDER — CLINDAMYCIN PHOSPHATE 600 MG/50ML IV SOLN
600.0000 mg | Freq: Once | INTRAVENOUS | Status: AC
  Administered 2020-08-06: 600 mg via INTRAVENOUS
  Filled 2020-08-06: qty 50

## 2020-08-06 MED ORDER — OXYCODONE-ACETAMINOPHEN 5-325 MG PO TABS: 1.0000 | ORAL_TABLET | Freq: Four times a day (QID) | ORAL | 0 refills | Status: DC | PRN

## 2020-08-06 MED ORDER — LACTATED RINGERS IV SOLN: INTRAVENOUS | Status: DC

## 2020-08-06 MED ORDER — LACTATED RINGERS IV BOLUS
500.0000 mL | Freq: Once | INTRAVENOUS | Status: AC
  Administered 2020-08-06: 500 mL via INTRAVENOUS

## 2020-08-06 MED ORDER — MORPHINE SULFATE (PF) 4 MG/ML IV SOLN
4.0000 mg | Freq: Once | INTRAVENOUS | Status: AC
  Administered 2020-08-06: 4 mg via INTRAVENOUS
  Filled 2020-08-06: qty 1

## 2020-08-06 MED ORDER — HYDROMORPHONE HCL 1 MG/ML IJ SOLN
1.0000 mg | Freq: Once | INTRAMUSCULAR | Status: AC
  Administered 2020-08-06: 1 mg via INTRAVENOUS
  Filled 2020-08-06: qty 1

## 2020-08-06 NOTE — ED Notes (Signed)
Patient transported to CT 
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
## 2020-08-06 NOTE — ED Notes (Signed)
Ortho notified of knee immobilizer ordered

## 2020-08-08 ENCOUNTER — Telehealth: Payer: Self-pay | Admitting: Surgery

## 2020-08-08 NOTE — Telephone Encounter (Signed)
Received incoming call from [facility] regarding clarification discharge prescription for Antibiotics. Clarification provided.

## 2022-05-04 ENCOUNTER — Other Ambulatory Visit: Payer: Self-pay

## 2022-05-04 ENCOUNTER — Inpatient Hospital Stay (HOSPITAL_COMMUNITY): Payer: Medicare HMO

## 2022-05-04 ENCOUNTER — Emergency Department (HOSPITAL_COMMUNITY): Payer: Medicare HMO

## 2022-05-04 ENCOUNTER — Encounter (HOSPITAL_COMMUNITY): Payer: Self-pay

## 2022-05-04 ENCOUNTER — Inpatient Hospital Stay (HOSPITAL_COMMUNITY)
Admission: EM | Admit: 2022-05-04 | Discharge: 2022-05-23 | DRG: 871 | Disposition: E | Payer: Medicare HMO | Attending: Pulmonary Disease | Admitting: Pulmonary Disease

## 2022-05-04 DIAGNOSIS — J09X1 Influenza due to identified novel influenza A virus with pneumonia: Secondary | ICD-10-CM

## 2022-05-04 DIAGNOSIS — E8770 Fluid overload, unspecified: Secondary | ICD-10-CM | POA: Diagnosis present

## 2022-05-04 DIAGNOSIS — Z880 Allergy status to penicillin: Secondary | ICD-10-CM

## 2022-05-04 DIAGNOSIS — A419 Sepsis, unspecified organism: Secondary | ICD-10-CM | POA: Diagnosis not present

## 2022-05-04 DIAGNOSIS — J9601 Acute respiratory failure with hypoxia: Secondary | ICD-10-CM | POA: Diagnosis not present

## 2022-05-04 DIAGNOSIS — I429 Cardiomyopathy, unspecified: Secondary | ICD-10-CM | POA: Diagnosis present

## 2022-05-04 DIAGNOSIS — J15211 Pneumonia due to Methicillin susceptible Staphylococcus aureus: Secondary | ICD-10-CM | POA: Diagnosis present

## 2022-05-04 DIAGNOSIS — R0603 Acute respiratory distress: Secondary | ICD-10-CM

## 2022-05-04 DIAGNOSIS — J9602 Acute respiratory failure with hypercapnia: Secondary | ICD-10-CM

## 2022-05-04 DIAGNOSIS — B9561 Methicillin susceptible Staphylococcus aureus infection as the cause of diseases classified elsewhere: Secondary | ICD-10-CM | POA: Diagnosis not present

## 2022-05-04 DIAGNOSIS — T886XXA Anaphylactic reaction due to adverse effect of correct drug or medicament properly administered, initial encounter: Secondary | ICD-10-CM | POA: Diagnosis not present

## 2022-05-04 DIAGNOSIS — Z79899 Other long term (current) drug therapy: Secondary | ICD-10-CM

## 2022-05-04 DIAGNOSIS — Y92230 Patient room in hospital as the place of occurrence of the external cause: Secondary | ICD-10-CM | POA: Diagnosis not present

## 2022-05-04 DIAGNOSIS — I10 Essential (primary) hypertension: Secondary | ICD-10-CM | POA: Diagnosis present

## 2022-05-04 DIAGNOSIS — Z515 Encounter for palliative care: Secondary | ICD-10-CM

## 2022-05-04 DIAGNOSIS — R7881 Bacteremia: Secondary | ICD-10-CM | POA: Insufficient documentation

## 2022-05-04 DIAGNOSIS — J101 Influenza due to other identified influenza virus with other respiratory manifestations: Secondary | ICD-10-CM | POA: Diagnosis not present

## 2022-05-04 DIAGNOSIS — J1008 Influenza due to other identified influenza virus with other specified pneumonia: Secondary | ICD-10-CM | POA: Diagnosis present

## 2022-05-04 DIAGNOSIS — I9589 Other hypotension: Secondary | ICD-10-CM | POA: Diagnosis not present

## 2022-05-04 DIAGNOSIS — E872 Acidosis, unspecified: Secondary | ICD-10-CM | POA: Diagnosis present

## 2022-05-04 DIAGNOSIS — R34 Anuria and oliguria: Secondary | ICD-10-CM | POA: Diagnosis present

## 2022-05-04 DIAGNOSIS — Z66 Do not resuscitate: Secondary | ICD-10-CM | POA: Diagnosis not present

## 2022-05-04 DIAGNOSIS — E876 Hypokalemia: Secondary | ICD-10-CM | POA: Diagnosis present

## 2022-05-04 DIAGNOSIS — F419 Anxiety disorder, unspecified: Secondary | ICD-10-CM | POA: Diagnosis present

## 2022-05-04 DIAGNOSIS — A4101 Sepsis due to Methicillin susceptible Staphylococcus aureus: Secondary | ICD-10-CM | POA: Diagnosis present

## 2022-05-04 DIAGNOSIS — R6521 Severe sepsis with septic shock: Secondary | ICD-10-CM | POA: Diagnosis present

## 2022-05-04 DIAGNOSIS — Z885 Allergy status to narcotic agent status: Secondary | ICD-10-CM

## 2022-05-04 DIAGNOSIS — D6489 Other specified anemias: Secondary | ICD-10-CM | POA: Diagnosis present

## 2022-05-04 DIAGNOSIS — G8929 Other chronic pain: Secondary | ICD-10-CM | POA: Diagnosis present

## 2022-05-04 DIAGNOSIS — T361X5A Adverse effect of cephalosporins and other beta-lactam antibiotics, initial encounter: Secondary | ICD-10-CM | POA: Diagnosis not present

## 2022-05-04 DIAGNOSIS — F17210 Nicotine dependence, cigarettes, uncomplicated: Secondary | ICD-10-CM | POA: Diagnosis present

## 2022-05-04 DIAGNOSIS — Z7984 Long term (current) use of oral hypoglycemic drugs: Secondary | ICD-10-CM

## 2022-05-04 DIAGNOSIS — E1165 Type 2 diabetes mellitus with hyperglycemia: Secondary | ICD-10-CM | POA: Diagnosis present

## 2022-05-04 DIAGNOSIS — I4891 Unspecified atrial fibrillation: Secondary | ICD-10-CM

## 2022-05-04 DIAGNOSIS — Z7901 Long term (current) use of anticoagulants: Secondary | ICD-10-CM

## 2022-05-04 DIAGNOSIS — R2231 Localized swelling, mass and lump, right upper limb: Secondary | ICD-10-CM | POA: Diagnosis not present

## 2022-05-04 DIAGNOSIS — M797 Fibromyalgia: Secondary | ICD-10-CM | POA: Diagnosis present

## 2022-05-04 DIAGNOSIS — E11649 Type 2 diabetes mellitus with hypoglycemia without coma: Secondary | ICD-10-CM | POA: Diagnosis not present

## 2022-05-04 DIAGNOSIS — Z9911 Dependence on respirator [ventilator] status: Secondary | ICD-10-CM | POA: Diagnosis not present

## 2022-05-04 DIAGNOSIS — Z20822 Contact with and (suspected) exposure to covid-19: Secondary | ICD-10-CM | POA: Diagnosis present

## 2022-05-04 DIAGNOSIS — N170 Acute kidney failure with tubular necrosis: Secondary | ICD-10-CM | POA: Diagnosis present

## 2022-05-04 DIAGNOSIS — R230 Cyanosis: Secondary | ICD-10-CM | POA: Diagnosis not present

## 2022-05-04 DIAGNOSIS — T380X5A Adverse effect of glucocorticoids and synthetic analogues, initial encounter: Secondary | ICD-10-CM | POA: Diagnosis present

## 2022-05-04 DIAGNOSIS — D6959 Other secondary thrombocytopenia: Secondary | ICD-10-CM | POA: Diagnosis present

## 2022-05-04 DIAGNOSIS — N179 Acute kidney failure, unspecified: Secondary | ICD-10-CM | POA: Diagnosis not present

## 2022-05-04 DIAGNOSIS — M549 Dorsalgia, unspecified: Secondary | ICD-10-CM | POA: Diagnosis present

## 2022-05-04 DIAGNOSIS — R0602 Shortness of breath: Secondary | ICD-10-CM | POA: Diagnosis present

## 2022-05-04 DIAGNOSIS — Z86718 Personal history of other venous thrombosis and embolism: Secondary | ICD-10-CM

## 2022-05-04 DIAGNOSIS — M542 Cervicalgia: Secondary | ICD-10-CM | POA: Diagnosis present

## 2022-05-04 DIAGNOSIS — J9690 Respiratory failure, unspecified, unspecified whether with hypoxia or hypercapnia: Secondary | ICD-10-CM | POA: Diagnosis present

## 2022-05-04 DIAGNOSIS — J80 Acute respiratory distress syndrome: Secondary | ICD-10-CM | POA: Diagnosis present

## 2022-05-04 DIAGNOSIS — J189 Pneumonia, unspecified organism: Secondary | ICD-10-CM | POA: Diagnosis not present

## 2022-05-04 LAB — LACTIC ACID, PLASMA
Lactic Acid, Venous: 5.2 mmol/L (ref 0.5–1.9)
Lactic Acid, Venous: 5.1 mmol/L (ref 0.5–1.9)

## 2022-05-04 LAB — BLOOD GAS, ARTERIAL
Acid-base deficit: 6.9 mmol/L — ABNORMAL HIGH (ref 0.0–2.0)
Bicarbonate: 26.6 mmol/L (ref 20.0–28.0)
O2 Saturation: 99.4 %
Patient temperature: 37.9
pCO2 arterial: 107 mmHg (ref 32–48)
pH, Arterial: 7.01 — CL (ref 7.35–7.45)
pO2, Arterial: 138 mmHg — ABNORMAL HIGH (ref 83–108)

## 2022-05-04 LAB — POCT I-STAT 7, (LYTES, BLD GAS, ICA,H+H)
Acid-base deficit: 1 mmol/L (ref 0.0–2.0)
Acid-base deficit: 7 mmol/L — ABNORMAL HIGH (ref 0.0–2.0)
Bicarbonate: 24.7 mmol/L (ref 20.0–28.0)
Bicarbonate: 30.5 mmol/L — ABNORMAL HIGH (ref 20.0–28.0)
Calcium, Ion: 1.04 mmol/L — ABNORMAL LOW (ref 1.15–1.40)
Calcium, Ion: 1.09 mmol/L — ABNORMAL LOW (ref 1.15–1.40)
HCT: 36 % (ref 36.0–46.0)
HCT: 38 % (ref 36.0–46.0)
Hemoglobin: 12.2 g/dL (ref 12.0–15.0)
Hemoglobin: 12.9 g/dL (ref 12.0–15.0)
O2 Saturation: 92 %
O2 Saturation: 93 %
Patient temperature: 38
Patient temperature: 38.5
Potassium: 3 mmol/L — ABNORMAL LOW (ref 3.5–5.1)
Potassium: 3.2 mmol/L — ABNORMAL LOW (ref 3.5–5.1)
Sodium: 134 mmol/L — ABNORMAL LOW (ref 135–145)
Sodium: 137 mmol/L (ref 135–145)
TCO2: 27 mmol/L (ref 22–32)
TCO2: 33 mmol/L — ABNORMAL HIGH (ref 22–32)
pCO2 arterial: 87.8 mmHg (ref 32–48)
pCO2 arterial: 93.1 mmHg (ref 32–48)
pH, Arterial: 7.063 — CL (ref 7.35–7.45)
pH, Arterial: 7.132 — CL (ref 7.35–7.45)
pO2, Arterial: 100 mmHg (ref 83–108)
pO2, Arterial: 95 mmHg (ref 83–108)

## 2022-05-04 LAB — MRSA NEXT GEN BY PCR, NASAL: MRSA by PCR Next Gen: NOT DETECTED

## 2022-05-04 LAB — I-STAT ARTERIAL BLOOD GAS, ED
Acid-base deficit: 5 mmol/L — ABNORMAL HIGH (ref 0.0–2.0)
Bicarbonate: 27.3 mmol/L (ref 20.0–28.0)
Calcium, Ion: 1.12 mmol/L — ABNORMAL LOW (ref 1.15–1.40)
HCT: 36 % (ref 36.0–46.0)
Hemoglobin: 12.2 g/dL (ref 12.0–15.0)
O2 Saturation: 96 %
Patient temperature: 37.7
Potassium: 3.3 mmol/L — ABNORMAL LOW (ref 3.5–5.1)
Sodium: 139 mmol/L (ref 135–145)
TCO2: 30 mmol/L (ref 22–32)
pCO2 arterial: 104.1 mmHg (ref 32–48)
pH, Arterial: 7.032 — CL (ref 7.35–7.45)
pO2, Arterial: 123 mmHg — ABNORMAL HIGH (ref 83–108)

## 2022-05-04 LAB — BASIC METABOLIC PANEL
Anion gap: 13 (ref 5–15)
BUN: 29 mg/dL — ABNORMAL HIGH (ref 8–23)
CO2: 30 mmol/L (ref 22–32)
Calcium: 6.9 mg/dL — ABNORMAL LOW (ref 8.9–10.3)
Chloride: 96 mmol/L — ABNORMAL LOW (ref 98–111)
Creatinine, Ser: 1.69 mg/dL — ABNORMAL HIGH (ref 0.44–1.00)
GFR, Estimated: 33 mL/min — ABNORMAL LOW (ref >60)
Glucose, Bld: 296 mg/dL — ABNORMAL HIGH (ref 70–99)
Potassium: 2.8 mmol/L — ABNORMAL LOW (ref 3.5–5.1)
Sodium: 139 mmol/L (ref 135–145)

## 2022-05-04 LAB — ECHOCARDIOGRAM COMPLETE
AR max vel: 4.34 cm2
AV Area VTI: 4.22 cm2
AV Area mean vel: 4.15 cm2
AV Mean grad: 5 mmHg
AV Peak grad: 8.8 mmHg
Ao pk vel: 1.48 m/s
Height: 66 in
MV VTI: 3.76 cm2
S' Lateral: 3.2 cm
Weight: 2821.89 oz

## 2022-05-04 LAB — TYPE AND SCREEN
ABO/RH(D): A POS
Antibody Screen: NEGATIVE

## 2022-05-04 LAB — C DIFFICILE QUICK SCREEN W PCR REFLEX
C Diff antigen: NEGATIVE
C Diff interpretation: NOT DETECTED
C Diff toxin: NEGATIVE

## 2022-05-04 LAB — CBC WITH DIFFERENTIAL/PLATELET
Abs Immature Granulocytes: 0.14 10*3/uL — ABNORMAL HIGH (ref 0.00–0.07)
Basophils Absolute: 0 10*3/uL (ref 0.0–0.1)
Basophils Relative: 0 %
Eosinophils Absolute: 0 10*3/uL (ref 0.0–0.5)
Eosinophils Relative: 0 %
HCT: 40.1 % (ref 36.0–46.0)
Hemoglobin: 13.6 g/dL (ref 12.0–15.0)
Immature Granulocytes: 2 %
Lymphocytes Relative: 7 %
Lymphs Abs: 0.6 10*3/uL — ABNORMAL LOW (ref 0.7–4.0)
MCH: 31.2 pg (ref 26.0–34.0)
MCHC: 33.9 g/dL (ref 30.0–36.0)
MCV: 92 fL (ref 80.0–100.0)
Monocytes Absolute: 0.3 10*3/uL (ref 0.1–1.0)
Monocytes Relative: 3 %
Neutro Abs: 7.3 10*3/uL (ref 1.7–7.7)
Neutrophils Relative %: 88 %
Platelets: 350 10*3/uL (ref 150–400)
RBC: 4.36 MIL/uL (ref 3.87–5.11)
RDW: 13.9 % (ref 11.5–15.5)
Smear Review: NORMAL
WBC: 8.4 10*3/uL (ref 4.0–10.5)
nRBC: 0 % (ref 0.0–0.2)

## 2022-05-04 LAB — COMPREHENSIVE METABOLIC PANEL
ALT: 16 U/L (ref 0–44)
AST: 35 U/L (ref 15–41)
Albumin: 1.8 g/dL — ABNORMAL LOW (ref 3.5–5.0)
Alkaline Phosphatase: 52 U/L (ref 38–126)
Anion gap: 14 (ref 5–15)
BUN: 22 mg/dL (ref 8–23)
CO2: 21 mmol/L — ABNORMAL LOW (ref 22–32)
Calcium: 8.1 mg/dL — ABNORMAL LOW (ref 8.9–10.3)
Chloride: 99 mmol/L (ref 98–111)
Creatinine, Ser: 1.13 mg/dL — ABNORMAL HIGH (ref 0.44–1.00)
GFR, Estimated: 54 mL/min — ABNORMAL LOW (ref >60)
Glucose, Bld: 128 mg/dL — ABNORMAL HIGH (ref 70–99)
Potassium: 3.1 mmol/L — ABNORMAL LOW (ref 3.5–5.1)
Sodium: 134 mmol/L — ABNORMAL LOW (ref 135–145)
Total Bilirubin: 2 mg/dL — ABNORMAL HIGH (ref 0.3–1.2)
Total Protein: 6.8 g/dL (ref 6.5–8.1)

## 2022-05-04 LAB — CBG MONITORING, ED: Glucose-Capillary: 90 mg/dL (ref 70–99)

## 2022-05-04 LAB — TROPONIN I (HIGH SENSITIVITY): Troponin I (High Sensitivity): 43 ng/L — ABNORMAL HIGH (ref <18)

## 2022-05-04 LAB — GLUCOSE, CAPILLARY
Glucose-Capillary: 326 mg/dL — ABNORMAL HIGH (ref 70–99)
Glucose-Capillary: 47 mg/dL — ABNORMAL LOW (ref 70–99)
Glucose-Capillary: 248 mg/dL — ABNORMAL HIGH (ref 70–99)

## 2022-05-04 LAB — CREATININE, SERUM
Creatinine, Ser: 1.23 mg/dL — ABNORMAL HIGH (ref 0.44–1.00)
GFR, Estimated: 48 mL/min — ABNORMAL LOW (ref >60)

## 2022-05-04 LAB — RESP PANEL BY RT-PCR (RSV, FLU A&B, COVID)  RVPGX2
Influenza A by PCR: POSITIVE — AB
Influenza B by PCR: NEGATIVE
Resp Syncytial Virus by PCR: NEGATIVE
SARS Coronavirus 2 by RT PCR: NEGATIVE

## 2022-05-04 LAB — PROTIME-INR
INR: 1.4 — ABNORMAL HIGH (ref 0.8–1.2)
Prothrombin Time: 17.3 seconds — ABNORMAL HIGH (ref 11.4–15.2)

## 2022-05-04 LAB — HIV ANTIBODY (ROUTINE TESTING W REFLEX): HIV Screen 4th Generation wRfx: NONREACTIVE

## 2022-05-04 LAB — POC OCCULT BLOOD, ED: Fecal Occult Bld: POSITIVE — AB

## 2022-05-04 MED ORDER — LACTATED RINGERS IV SOLN: INTRAVENOUS | Status: DC

## 2022-05-04 MED ORDER — ALBUTEROL SULFATE (2.5 MG/3ML) 0.083% IN NEBU
10.0000 mg/h | INHALATION_SOLUTION | Freq: Once | RESPIRATORY_TRACT | Status: AC
  Administered 2022-05-04: 10 mg/h via RESPIRATORY_TRACT
  Filled 2022-05-04: qty 12

## 2022-05-04 MED ORDER — PANTOPRAZOLE SODIUM 40 MG IV SOLR
40.0000 mg | INTRAVENOUS | Status: DC
  Administered 2022-05-05 – 2022-05-06 (×2): 40 mg via INTRAVENOUS
  Filled 2022-05-04 (×2): qty 10

## 2022-05-04 MED ORDER — IPRATROPIUM-ALBUTEROL 0.5-2.5 (3) MG/3ML IN SOLN: 3.0000 mL | RESPIRATORY_TRACT | Status: DC | PRN

## 2022-05-04 MED ORDER — METHYLPREDNISOLONE SODIUM SUCC 40 MG IJ SOLR
40.0000 mg | Freq: Two times a day (BID) | INTRAMUSCULAR | Status: DC
  Administered 2022-05-04 – 2022-05-05 (×2): 40 mg via INTRAVENOUS
  Filled 2022-05-04 (×2): qty 1

## 2022-05-04 MED ORDER — FENTANYL CITRATE PF 50 MCG/ML IJ SOSY: 50.0000 ug | PREFILLED_SYRINGE | Freq: Once | INTRAMUSCULAR | Status: DC

## 2022-05-04 MED ORDER — AMIODARONE LOAD VIA INFUSION
150.0000 mg | Freq: Once | INTRAVENOUS | Status: AC
  Administered 2022-05-04: 150 mg via INTRAVENOUS
  Filled 2022-05-04: qty 83.34

## 2022-05-04 MED ORDER — PHENYLEPHRINE 80 MCG/ML (10ML) SYRINGE FOR IV PUSH (FOR BLOOD PRESSURE SUPPORT)
160.0000 ug | PREFILLED_SYRINGE | Freq: Once | INTRAVENOUS | Status: DC
  Administered 2022-05-04: 160 ug via INTRAVENOUS

## 2022-05-04 MED ORDER — HEPARIN SODIUM (PORCINE) 5000 UNIT/ML IJ SOLN
5000.0000 [IU] | Freq: Three times a day (TID) | INTRAMUSCULAR | Status: DC
  Administered 2022-05-04: 5000 [IU] via SUBCUTANEOUS
  Filled 2022-05-04 (×2): qty 1

## 2022-05-04 MED ORDER — NOREPINEPHRINE 4 MG/250ML-% IV SOLN
0.0000 ug/min | INTRAVENOUS | Status: DC
  Administered 2022-05-04: 40 ug/min via INTRAVENOUS
  Administered 2022-05-04: 37 ug/min via INTRAVENOUS
  Administered 2022-05-04: 40 ug/min via INTRAVENOUS
  Filled 2022-05-04 (×4): qty 250

## 2022-05-04 MED ORDER — LORAZEPAM 2 MG/ML IJ SOLN
0.5000 mg | Freq: Once | INTRAMUSCULAR | Status: AC
  Administered 2022-05-04: 0.5 mg via INTRAVENOUS
  Filled 2022-05-04: qty 1

## 2022-05-04 MED ORDER — SODIUM BICARBONATE 8.4 % IV SOLN
50.0000 meq | Freq: Once | INTRAVENOUS | Status: AC
  Administered 2022-05-04: 50 meq via INTRAVENOUS

## 2022-05-04 MED ORDER — ARTIFICIAL TEARS OPHTHALMIC OINT
1.0000 | TOPICAL_OINTMENT | Freq: Three times a day (TID) | OPHTHALMIC | Status: DC
  Filled 2022-05-04: qty 3.5

## 2022-05-04 MED ORDER — NOREPINEPHRINE 4 MG/250ML-% IV SOLN
2.0000 ug/min | INTRAVENOUS | Status: DC
  Administered 2022-05-04: 2 ug/min via INTRAVENOUS
  Filled 2022-05-04: qty 250

## 2022-05-04 MED ORDER — LACTATED RINGERS IV BOLUS (SEPSIS)
1000.0000 mL | Freq: Once | INTRAVENOUS | Status: AC
  Administered 2022-05-04: 1000 mL via INTRAVENOUS

## 2022-05-04 MED ORDER — PROPOFOL 1000 MG/100ML IV EMUL
0.0000 ug/kg/min | INTRAVENOUS | Status: DC
  Administered 2022-05-04: 5 ug/kg/min via INTRAVENOUS
  Filled 2022-05-04 (×2): qty 100

## 2022-05-04 MED ORDER — PROPOFOL 1000 MG/100ML IV EMUL: 5.0000 ug/kg/min | INTRAVENOUS | Status: DC

## 2022-05-04 MED ORDER — AMIODARONE HCL IN DEXTROSE 360-4.14 MG/200ML-% IV SOLN
60.0000 mg/h | INTRAVENOUS | Status: DC
  Administered 2022-05-04 (×2): 30 mg/h via INTRAVENOUS
  Administered 2022-05-05 (×2): 60 mg/h via INTRAVENOUS
  Administered 2022-05-05 (×2): 30 mg/h via INTRAVENOUS
  Administered 2022-05-06 (×2): 60 mg/h via INTRAVENOUS
  Filled 2022-05-04 (×7): qty 200

## 2022-05-04 MED ORDER — DEXTROSE 50 % IV SOLN
INTRAVENOUS | Status: AC
  Administered 2022-05-04: 50 mL
  Filled 2022-05-04: qty 50

## 2022-05-04 MED ORDER — MIDAZOLAM HCL 2 MG/2ML IJ SOLN
1.0000 mg | INTRAMUSCULAR | Status: DC | PRN
  Filled 2022-05-04: qty 2

## 2022-05-04 MED ORDER — VASOPRESSIN 20 UNITS/100 ML INFUSION FOR SHOCK
0.0000 [IU]/min | INTRAVENOUS | Status: DC
  Administered 2022-05-04: 0.03 [IU]/min via INTRAVENOUS
  Administered 2022-05-05: 0.04 [IU]/min via INTRAVENOUS
  Administered 2022-05-05: 0.03 [IU]/min via INTRAVENOUS
  Administered 2022-05-05 – 2022-05-06 (×2): 0.04 [IU]/min via INTRAVENOUS
  Filled 2022-05-04 (×5): qty 100

## 2022-05-04 MED ORDER — SODIUM BICARBONATE 8.4 % IV SOLN
200.0000 meq | Freq: Once | INTRAVENOUS | Status: AC
  Administered 2022-05-04: 200 meq via INTRAVENOUS
  Filled 2022-05-04: qty 200

## 2022-05-04 MED ORDER — OSELTAMIVIR PHOSPHATE 6 MG/ML PO SUSR
75.0000 mg | Freq: Two times a day (BID) | ORAL | Status: DC
  Administered 2022-05-05 (×2): 75 mg
  Filled 2022-05-04 (×5): qty 12.5

## 2022-05-04 MED ORDER — ACETAMINOPHEN 325 MG PO TABS
650.0000 mg | ORAL_TABLET | Freq: Four times a day (QID) | ORAL | Status: DC | PRN
  Administered 2022-05-04 – 2022-05-06 (×4): 650 mg
  Filled 2022-05-04 (×4): qty 2

## 2022-05-04 MED ORDER — ETOMIDATE 2 MG/ML IV SOLN
15.0000 mg | Freq: Once | INTRAVENOUS | Status: AC
  Administered 2022-05-04: 15 mg via INTRAVENOUS

## 2022-05-04 MED ORDER — POTASSIUM CHLORIDE 10 MEQ/50ML IV SOLN
10.0000 meq | INTRAVENOUS | Status: AC
  Administered 2022-05-04 – 2022-05-05 (×6): 10 meq via INTRAVENOUS
  Filled 2022-05-04: qty 50

## 2022-05-04 MED ORDER — SODIUM BICARBONATE 8.4 % IV SOLN
100.0000 meq | Freq: Once | INTRAVENOUS | Status: DC
  Filled 2022-05-04: qty 100

## 2022-05-04 MED ORDER — VITAL HIGH PROTEIN PO LIQD: 1000.0000 mL | ORAL | Status: DC

## 2022-05-04 MED ORDER — ORAL CARE MOUTH RINSE: 15.0000 mL | OROMUCOSAL | Status: DC | PRN

## 2022-05-04 MED ORDER — LEVOFLOXACIN IN D5W 750 MG/150ML IV SOLN: 750.0000 mg | INTRAVENOUS | Status: DC

## 2022-05-04 MED ORDER — ROCURONIUM BROMIDE 50 MG/5ML IV SOLN
60.0000 mg | Freq: Once | INTRAVENOUS | Status: DC
  Administered 2022-05-04: 60 mg via INTRAVENOUS

## 2022-05-04 MED ORDER — VANCOMYCIN HCL 1250 MG/250ML IV SOLN: 1250.0000 mg | INTRAVENOUS | Status: DC

## 2022-05-04 MED ORDER — SODIUM CHLORIDE 0.9 % IV BOLUS
1000.0000 mL | Freq: Once | INTRAVENOUS | Status: AC
  Administered 2022-05-04: 1000 mL via INTRAVENOUS

## 2022-05-04 MED ORDER — NOREPINEPHRINE 16 MG/250ML-% IV SOLN
0.0000 ug/min | INTRAVENOUS | Status: DC
  Administered 2022-05-04 – 2022-05-06 (×6): 40 ug/min via INTRAVENOUS
  Filled 2022-05-04 (×6): qty 250

## 2022-05-04 MED ORDER — SODIUM CHLORIDE 0.9 % IV SOLN: INTRAVENOUS | Status: DC

## 2022-05-04 MED ORDER — INSULIN ASPART 100 UNIT/ML IJ SOLN
0.0000 [IU] | INTRAMUSCULAR | Status: DC
  Administered 2022-05-04: 5 [IU] via SUBCUTANEOUS
  Administered 2022-05-05: 4 [IU] via SUBCUTANEOUS
  Administered 2022-05-05: 3 [IU] via SUBCUTANEOUS

## 2022-05-04 MED ORDER — PROPOFOL 1000 MG/100ML IV EMUL
INTRAVENOUS | Status: AC
  Administered 2022-05-04: 15 ug/kg/min via INTRAVENOUS
  Filled 2022-05-04: qty 100

## 2022-05-04 MED ORDER — DOCUSATE SODIUM 50 MG/5ML PO LIQD
100.0000 mg | Freq: Two times a day (BID) | ORAL | Status: DC
  Administered 2022-05-04 – 2022-05-06 (×4): 100 mg
  Filled 2022-05-04 (×4): qty 10

## 2022-05-04 MED ORDER — PHENYLEPHRINE HCL-NACL 20-0.9 MG/250ML-% IV SOLN
0.0000 ug/min | INTRAVENOUS | Status: DC
  Administered 2022-05-04: 400 ug/min via INTRAVENOUS
  Filled 2022-05-04: qty 250

## 2022-05-04 MED ORDER — CHLORHEXIDINE GLUCONATE CLOTH 2 % EX PADS
6.0000 | MEDICATED_PAD | Freq: Every day | CUTANEOUS | Status: DC
  Administered 2022-05-04 – 2022-05-06 (×2): 6 via TOPICAL

## 2022-05-04 MED ORDER — PROPOFOL 10 MG/ML IV BOLUS
0.5000 mg/kg | Freq: Once | INTRAVENOUS | Status: DC
  Filled 2022-05-04: qty 20

## 2022-05-04 MED ORDER — VASOPRESSIN 20 UNITS/100 ML INFUSION FOR SHOCK
INTRAVENOUS | Status: AC
  Administered 2022-05-04: 0.03 [IU]/min via INTRAVENOUS
  Filled 2022-05-04: qty 100

## 2022-05-04 MED ORDER — SODIUM BICARBONATE 8.4 % IV SOLN
INTRAVENOUS | Status: DC
  Filled 2022-05-04 (×3): qty 1000

## 2022-05-04 MED ORDER — PHENYLEPHRINE 80 MCG/ML (10ML) SYRINGE FOR IV PUSH (FOR BLOOD PRESSURE SUPPORT)
80.0000 ug | PREFILLED_SYRINGE | Freq: Once | INTRAVENOUS | Status: DC
  Administered 2022-05-04: 80 ug via INTRAVENOUS

## 2022-05-04 MED ORDER — FENTANYL 2500MCG IN NS 250ML (10MCG/ML) PREMIX INFUSION
50.0000 ug/h | INTRAVENOUS | Status: DC
  Administered 2022-05-04: 50 ug/h via INTRAVENOUS
  Filled 2022-05-04: qty 250

## 2022-05-04 MED ORDER — DOCUSATE SODIUM 50 MG/5ML PO LIQD: 100.0000 mg | Freq: Two times a day (BID) | ORAL | Status: DC | PRN

## 2022-05-04 MED ORDER — FENTANYL BOLUS VIA INFUSION: 25.0000 ug | INTRAVENOUS | Status: DC | PRN

## 2022-05-04 MED ORDER — SODIUM BICARBONATE 8.4 % IV SOLN
50.0000 meq | Freq: Once | INTRAVENOUS | Status: DC
  Administered 2022-05-04: 50 meq via INTRAVENOUS

## 2022-05-04 MED ORDER — AMIODARONE HCL IN DEXTROSE 360-4.14 MG/200ML-% IV SOLN
60.0000 mg/h | INTRAVENOUS | Status: AC
  Administered 2022-05-04: 60 mg/h via INTRAVENOUS
  Filled 2022-05-04 (×2): qty 200

## 2022-05-04 MED ORDER — MIDAZOLAM HCL 2 MG/2ML IJ SOLN
1.0000 mg | INTRAMUSCULAR | Status: DC | PRN
  Administered 2022-05-04: 2 mg via INTRAVENOUS
  Filled 2022-05-04: qty 2

## 2022-05-04 MED ORDER — DOCUSATE SODIUM 100 MG PO CAPS: 100.0000 mg | ORAL_CAPSULE | Freq: Two times a day (BID) | ORAL | Status: DC | PRN

## 2022-05-04 MED ORDER — SUCCINYLCHOLINE CHLORIDE 200 MG/10ML IV SOSY
100.0000 mg | PREFILLED_SYRINGE | Freq: Once | INTRAVENOUS | Status: AC
  Administered 2022-05-04: 100 mg via INTRAVENOUS

## 2022-05-04 MED ORDER — POLYETHYLENE GLYCOL 3350 17 G PO PACK
17.0000 g | PACK | Freq: Every day | ORAL | Status: DC
  Administered 2022-05-05 – 2022-05-06 (×2): 17 g
  Filled 2022-05-04 (×2): qty 1

## 2022-05-04 MED ORDER — ORAL CARE MOUTH RINSE
15.0000 mL | OROMUCOSAL | Status: DC
  Administered 2022-05-04 – 2022-05-06 (×22): 15 mL via OROMUCOSAL

## 2022-05-04 MED ORDER — NYSTATIN 100000 UNIT/GM EX POWD
Freq: Three times a day (TID) | CUTANEOUS | Status: DC
  Filled 2022-05-04: qty 15

## 2022-05-04 MED ORDER — VANCOMYCIN HCL 1500 MG/300ML IV SOLN
1500.0000 mg | Freq: Once | INTRAVENOUS | Status: AC
  Administered 2022-05-04: 1500 mg via INTRAVENOUS
  Filled 2022-05-04: qty 300

## 2022-05-04 MED ORDER — SODIUM BICARBONATE 8.4 % IV SOLN
INTRAVENOUS | Status: AC
  Administered 2022-05-04: 100 meq via INTRAVENOUS
  Filled 2022-05-04: qty 50

## 2022-05-04 MED ORDER — FREE WATER
100.0000 mL | Freq: Four times a day (QID) | Status: DC
  Administered 2022-05-05: 100 mL

## 2022-05-04 MED ORDER — POLYETHYLENE GLYCOL 3350 17 G PO PACK: 17.0000 g | PACK | Freq: Every day | ORAL | Status: DC | PRN

## 2022-05-04 MED ORDER — FENTANYL BOLUS VIA INFUSION: 50.0000 ug | INTRAVENOUS | Status: DC | PRN

## 2022-05-04 MED ORDER — FENTANYL 2500MCG IN NS 250ML (10MCG/ML) PREMIX INFUSION
25.0000 ug/h | INTRAVENOUS | Status: DC
  Administered 2022-05-04: 50 ug/h via INTRAVENOUS

## 2022-05-04 MED ORDER — LEVOFLOXACIN IN D5W 750 MG/150ML IV SOLN
750.0000 mg | Freq: Once | INTRAVENOUS | Status: AC
  Administered 2022-05-04: 750 mg via INTRAVENOUS
  Filled 2022-05-04: qty 150

## 2022-05-04 MED ORDER — ALBUTEROL SULFATE HFA 108 (90 BASE) MCG/ACT IN AERS: 2.0000 | INHALATION_SPRAY | RESPIRATORY_TRACT | Status: DC | PRN

## 2022-05-04 NOTE — ED Notes (Signed)
During central line insertion, patient blood pressure continued to stay below expected values. Neo and vaso added to patient medication drips to increase MAP.

## 2022-05-04 NOTE — Progress Notes (Signed)
eLink Physician-Brief Progress Note Patient Name: Katelyn Stevenson DOB: March 04, 1956 MRN: [id number]   Date of Service  2022-05-27  HPI/Events of Note  ABG on 100%/PRVC 35/TV 470/P 12 = 7.06/87.8/100/24.7. Already on a NaHCO3 IV infusion at 150 mL/hour.   eICU Interventions  Plan: NaHCO3 IV 200 meq IV now. Repeat ABG at 11 PM.     Intervention Category Major Interventions: Acid-Base disturbance - evaluation and management;Respiratory failure - evaluation and management  Lenell Antu 05-27-22, 9:06 PM

## 2022-05-04 NOTE — ED Notes (Signed)
Three nurses attempted at OG/NG placement, unsuccessful.

## 2022-05-04 NOTE — Progress Notes (Signed)
Paged for admission.  Patient presents with acute hypoxic respiratory failure and septic shock from community-acquired pneumonia. When evaluated at bedside, patient had respiratory distress on 10 L of high flow nasal cannula.  She did not tolerate BiPAP.  Discussed with ED provider to critical care team that she is not stable for stepdown unit. On reevaluation, patient was intubated and will be admitted to the ICU.

## 2022-05-04 NOTE — H&P (Signed)
NAME:  Katelyn Stevenson, MRN:  [id number], DOB:  Apr 01, 1956, LOS: 0 ADMISSION DATE:  May 19, 2022, CONSULTATION DATE: 19-May-2022 REFERRING MD: Emergency department physician, CHIEF COMPLAINT: Acute respiratory distress  History of Present Illness:  66 year old smoker who quit approximately 4 weeks ago presents with 3 days of increasing shortness of breath, general malaise, fever and in the emergency room was found to be positive for influenza A.  She is in very obvious respiratory distress chest x-ray is consistent with ARDS  picture. Urgent intubation transferred to the intensive care unit.  Pertinent  Medical History   Past Medical History:  Diagnosis Date   Alcoholic hepatitis    Anxiety    Chronic back pain    Chronic neck pain    Chronic pain    Diabetes mellitus    Fibromyalgia    Hemochromatosis    Hepatic encephalopathy (HCC)    Hypertension    Thrombocytopenia (HCC)    Thrombus DVT     Significant Hospital Events: Including procedures, antibiotic start and stop dates in addition to other pertinent events     Interim History / Subjective:  In acute respiratory distress will need to be intubated  Objective   Blood pressure (!) 99/48, pulse (!) 138, temperature (!) 97.4 F (36.3 C), temperature source Oral, resp. rate (!) 43, height 5\' 6"  (1.676 m), weight 80 kg, SpO2 90 %.    FiO2 (%):  [50 %] 50 %   Intake/Output Summary (Last 24 hours) at 05-19-2022 1122 Last data filed at 19-May-2022 1112 Gross per 24 hour  Intake 1000 ml  Output --  Net 1000 ml   Filed Weights   05-19-22 0853  Weight: 80 kg    Examination: General: Woman looks much older than stated age is obvious respiratory distress HENT: No JVD or lymphadenopathy is appreciated Lungs: No air movement with respiratory rate of 56 Cardiovascular: Heart sounds are tachycardic Abdomen: Obese soft positive bowel sounds Extremities: 1+ edema Neuro: Follows commands but is lethargic   Resolved  Hospital Problem list     Assessment & Plan:  Acute hypoxic respiratory failure in the setting of lifelong tobacco abuse and influenza A positive with chest x-ray notable for bilateral adult respiratory distress syndrome Urgent intubation being performed by the emergency department physician Admit to the intensive care unit Bronchodilators Antibiotics with vancomycin and Levaquin due to penicillin allergy.  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 Use heparin for prophylactic Questionable restart Xarelto at a later date  Best Practice (right click and "Reselect all SmartList Selections" daily)   Diet/type: NPO DVT prophylaxis: prophylactic heparin  GI prophylaxis: PPI Lines: N/A Foley:  N/A Code Status:  full code Last date of multidisciplinary goals of care discussion [tbd]  Labs   CBC: Recent Labs  Lab May 19, 2022 0813  WBC 8.4  NEUTROABS 7.3  HGB 13.6  HCT 40.1  MCV 92.0  PLT 350    Basic Metabolic Panel: Recent Labs  Lab 2022-05-19 0813  NA 134*  K 3.1*  CL 99  CO2 21*  GLUCOSE 128*  BUN 22  CREATININE 1.13*  CALCIUM 8.1*   GFR: Estimated Creatinine Clearance: 52.3 mL/min (A) (by C-G formula based on SCr of 1.13 mg/dL (H)). Recent Labs  Lab 05-19-22 0813  WBC 8.4  LATICACIDVEN 5.2*    Liver Function Tests:  Recent Labs  Lab 04/24/2022 0813  AST 35  ALT 16  ALKPHOS 52  BILITOT 2.0*  PROT 6.8  ALBUMIN 1.8*   No results for input(s): "LIPASE", "AMYLASE" in the last 168 hours. No results for input(s): "AMMONIA" in the last 168 hours.  ABG    Component Value Date/Time   PHART 7.471 (H) 09/11/2009 1259   PCO2ART 31.5 (L) 09/11/2009 1259   PO2ART 75.6 (L) 09/11/2009 1259   HCO3 22.7 09/11/2009 1259   TCO2 23.7 09/11/2009 1259   ACIDBASEDEF 0.5 09/11/2009 1259   O2SAT  96.0 09/11/2009 1259     Coagulation Profile: Recent Labs  Lab 04/26/2022 0813  INR 1.4*    Cardiac Enzymes: No results for input(s): "CKTOTAL", "CKMB", "CKMBINDEX", "TROPONINI" in the last 168 hours.  HbA1C: Hgb A1c MFr Bld  Date/Time Value Ref Range Status  10/04/2009 04:22 PM (H) <5.7 % Final   6.9 (NOTE)                                                                       According to the ADA Clinical Practice Recommendations for 2011, when HbA1c is used as a screening test:   >=6.5%   Diagnostic of Diabetes Mellitus           (if abnormal result  is confirmed)  5.7-6.4%   Increased risk of developing Diabetes Mellitus  References:Diagnosis and Classification of Diabetes Mellitus,Diabetes Care,2011,34(Suppl 1):S62-S69 and Standards of Medical Care in         Diabetes - 2011,Diabetes [id number]  (Suppl 1):S11-S61.  09/09/2009 06:30 AM  <5.7 % Final   5.3 (NOTE)                                                                       According to the ADA Clinical Practice Recommendations for 2011, when HbA1c is used as a screening test:   >=6.5%   Diagnostic of Diabetes Mellitus           (if abnormal result  is confirmed)  5.7-6.4%   Increased risk of developing Diabetes Mellitus  References:Diagnosis and Classification of Diabetes Mellitus,Diabetes [id number] 1):S62-S69 and Standards of Medical Care in         Diabetes - 2011,Diabetes Care,2011,34  (Suppl 1):S11-S61.    CBG: No results for input(s): "GLUCAP" in the last 168 hours.  Review of Systems:   10 point review of system taken, please see HPI for positives and negatives.   Past Medical History:  She,  has a past medical history of Alcoholic hepatitis, Anxiety, Chronic back pain, Chronic neck pain, Chronic pain, Diabetes mellitus, Fibromyalgia, Hemochromatosis, Hepatic encephalopathy ([place]), Hypertension, Thrombocytopenia ([place]), and Thrombus (DVT).   Surgical History:   Past Surgical History:  Procedure  Laterality Date   ROTATOR CUFF REPAIR       Social History:   reports that she has been smoking cigarettes. She has never used smokeless tobacco. She reports that she does not drink alcohol and does not use drugs.  Family History:  Her family history is not on file.   Allergies Allergies  Allergen Reactions   Penicillins Anaphylaxis and Swelling   Codeine Itching    Benadryl is given to combat symptoms     Home Medications  Prior to Admission medications   Medication Sig Start Date End Date Taking? Authorizing Provider  clindamycin (CLEOCIN) 150 MG capsule Take 3 capsules (450 mg total) by mouth 3 (three) times daily. 08/14/15   Muthersbaugh, Jarrett Soho, PA-C  clindamycin (CLEOCIN) 300 MG capsule Take 1 capsule (300 mg total) by mouth 4 (four) times daily. X 7 days 08/06/20   Charlesetta Shanks, MD  doxycycline (VIBRAMYCIN) 100 MG capsule Take 1 capsule (100 mg total) by mouth 2 (two) times daily. 04/19/19   Fransico Meadow, PA-C  furosemide (LASIX) 40 MG tablet Take 40 mg by mouth daily.    [provider]  glimepiride (AMARYL) 1 MG tablet Take 1 tablet (1 mg total) by mouth daily. 08/14/15   Muthersbaugh, Jarrett Soho, PA-C  metFORMIN (GLUCOPHAGE) 1000 MG tablet Take 1 tablet (1,000 mg total) by mouth 2 (two) times daily with a meal. 08/14/15   Muthersbaugh, Jarrett Soho, PA-C  omeprazole (PRILOSEC) 20 MG capsule Take 20 mg by mouth daily.    [provider]  oxyCODONE-acetaminophen (PERCOCET/ROXICET) 5-325 MG tablet Take 1-2 tablets by mouth every 6 (six) hours as needed for severe pain. 08/06/20   Tegeler, Gwenyth Allegra, MD  pravastatin (PRAVACHOL) 20 MG tablet Take 20 mg by mouth daily.  01/28/15   [provider]  rifaximin (XIFAXAN) 550 MG TABS Take 550 mg by mouth 2 (two) times daily.    [provider]  rivaroxaban (XARELTO) 20 MG TABS tablet Take 1 tablet (20 mg total) by mouth daily. 08/14/15   Muthersbaugh, Jarrett Soho, PA-C  spironolactone (ALDACTONE) 100 MG tablet  Take 100 mg by mouth daily.    [provider]     Critical care time: 35 min     Richardson Landry Yassen Kinnett ACNP Acute Care Nurse Practitioner [place] Please consult Amion 05/11/2022, 11:22 AM    Chest x-ray indicative of airspace disease

## 2022-05-04 NOTE — ED Notes (Signed)
Trauma Event Note  Assisted with ICU tasks totaling 30 minutes including art line, vasopressor titration.  Jill Side Clide Remmers  Trauma Response RN  Please call TRN at [phone] for further assistance.

## 2022-05-04 NOTE — ED Notes (Incomplete)
Pt began to desaturate

## 2022-05-04 NOTE — Progress Notes (Signed)
RT notified MD that patient is not tolerating Bipap at this time.  MD and RT placed pt on [place] 5L, sats 89%.  RT switched patient to a [place] at 10L, sats 93%. RN notified.

## 2022-05-04 NOTE — Progress Notes (Signed)
eLink Physician-Brief Progress Note Patient Name: Katelyn Stevenson DOB: [birth year] MRN: [id number]   Date of Service  05/10/2022  HPI/Events of Note  Hypokalemia - K+ = 2.8 and Creatinine = 1.69.  eICU Interventions  Will replace K+.      Intervention Category Major Interventions: Electrolyte abnormality - evaluation and management  Clarnce Homan Eugene 04/23/2022, 10:44 PM

## 2022-05-04 NOTE — Consult Note (Signed)
Cardiology Consultation:   Patient ID: Katelyn Stevenson; [id number]; 11-01-1955   Admit date: 05/05/2022 Date of Consult: 05/03/2022  Primary Care Provider: Delorse Lek, MD Primary Cardiologist: None  Primary Electrophysiologist:  None   Patient Profile:   Katelyn Stevenson is a 66 y.o. female without a prior cardiac history who is being seen today for the evaluation of atrial fib  at the request of Dr. Alona Bene.  History of Present Illness:   Ms. Holts has no past cardiac history.  I do see an echo from 2011 that was essentially unremarkable.  She did have a history of a DVT in 2017.  She has longstanding tobacco history.  Her husband says she gets around the house somewhat slowly.  However, she never had these kind of respiratory complaints that he can recall.  He reports that she had about 3 or 4 days of increasing shortness of breath and he tried to convince her to let him call 911 and she would not do it.  He said she was not complaining of chest discomfort.  There was no apparent palpitations.  They live together with a roommate.  They have been married for 41 years.  She does have a history of alcohol abuse and chronic pain.   Past Medical History:  Diagnosis Date   Alcoholic hepatitis    Anxiety    Chronic back pain    Chronic neck pain    Chronic pain    Diabetes mellitus    Fibromyalgia    Hemochromatosis    Hepatic encephalopathy (HCC)    Hypertension    Thrombocytopenia (HCC)    Thrombus DVT    Past Surgical History:  Procedure Laterality Date   ROTATOR CUFF REPAIR       Home Medications:  Prior to Admission medications   Medication Sig Start Date End Date Taking? Authorizing Provider  furosemide (LASIX) 40 MG tablet Take 40 mg by mouth daily.    [provider]  glimepiride (AMARYL) 1 MG tablet Take 1 tablet (1 mg total) by mouth daily. 08/14/15   Muthersbaugh, Dahlia Client, PA-C  metFORMIN (GLUCOPHAGE) 1000 MG tablet Take 1 tablet (1,000 mg  total) by mouth 2 (two) times daily with a meal. 08/14/15   Muthersbaugh, Dahlia Client, PA-C  omeprazole (PRILOSEC) 20 MG capsule Take 20 mg by mouth daily.    [provider]  oxyCODONE-acetaminophen (PERCOCET/ROXICET) 5-325 MG tablet Take 1-2 tablets by mouth every 6 (six) hours as needed for severe pain. 08/06/20   Tegeler, Canary Brim, MD  pravastatin (PRAVACHOL) 20 MG tablet Take 20 mg by mouth daily.  01/28/15   [provider]  rifaximin (XIFAXAN) 550 MG TABS Take 550 mg by mouth 2 (two) times daily.    [provider]  rivaroxaban (XARELTO) 20 MG TABS tablet Take 1 tablet (20 mg total) by mouth daily. 08/14/15   Muthersbaugh, Dahlia Client, PA-C  spironolactone (ALDACTONE) 100 MG tablet Take 100 mg by mouth daily.    [provider]    Inpatient Medications: Scheduled Meds:  vasopressin       artificial tears  1 Application Both Eyes Q8H   docusate  100 mg Per Tube BID   free water  100 mL Per Tube Q6H   heparin  5,000 Units Subcutaneous Q8H   insulin aspart  0-15 Units Subcutaneous Q4H   mouth rinse  15 mL Mouth Rinse Q2H   oseltamivir  75 mg Per Tube BID   pantoprazole (PROTONIX) IV  40 mg Intravenous Q24H   polyethylene glycol  17 g Per Tube Daily   rocuronium  60 mg Intravenous Once   sodium bicarbonate       sodium bicarbonate  100 mEq Intravenous Once   sodium bicarbonate  50 mEq Intravenous Once   Continuous Infusions:  amiodarone     Followed by   amiodarone     fentaNYL infusion INTRAVENOUS 50 mcg/hr (05/08/2022 1305)   lactated ringers 150 mL/hr at 05/15/2022 0928   [START ON 05/05/2022] levofloxacin (LEVAQUIN) IV     norepinephrine (LEVOPHED) Adult infusion 5 mcg/min (05/10/2022 1202)   phenylephrine (NEO-SYNEPHRINE) Adult infusion     propofol (DIPRIVAN) infusion 15 mcg/kg/min (05/20/2022 1251)   propofol     [START ON 05/05/2022] vancomycin     vancomycin 1,500 mg (05/05/2022 1310)   vasopressin     PRN Meds: vasopressin, acetaminophen,  docusate, fentaNYL, ipratropium-albuterol, midazolam, mouth rinse, polyethylene glycol, propofol, sodium bicarbonate  Allergies:    Allergies  Allergen Reactions   Penicillins Anaphylaxis and Swelling   Codeine Itching    Benadryl is given to combat symptoms    Social History:   Social History   Socioeconomic History   Marital status: Married    Spouse name: Not on file   Number of children: Not on file   Years of education: Not on file   Highest education level: Not on file  Occupational History   Not on file  Tobacco Use   Smoking status: Every Day    Types: Cigarettes   Smokeless tobacco: Never  Substance and Sexual Activity   Alcohol use: No   Drug use: No   Sexual activity: Not on file  Other Topics Concern   Not on file  Social History Narrative   Not on file   Social Determinants of Health   Financial Resource Strain: Not on file  Food Insecurity: Not on file  Transportation Needs: Not on file  Physical Activity: Not on file  Stress: Not on file  Social Connections: Not on file  Intimate Partner Violence: Not on file    Family History:   Unknown.  Her parents are both deceased.  ROS:  Please see the history of present illness.  The patient is intubated.  Unable to obtain review of systems  Physical Exam/Data:   Vitals:   05/11/2022 1200 04/24/2022 1245 04/30/2022 1300 04/30/2022 1315  BP: (!) 180/85 (!) 140/104 97/76   Pulse: (!) 137 (!) 148 89 (!) 27  Resp: 16 (!) 28 (!) 39 (!) 25  Temp:  (!) 101.2 F (38.4 C) (!) 101.8 F (38.8 C) (!) 102 F (38.9 C)  TempSrc:      SpO2: 99% 90% 93% (!) 65%  Weight:      Height:        Intake/Output Summary (Last 24 hours) at 04/22/2022 1409 Last data filed at 05/13/2022 1249 Gross per 24 hour  Intake 2158.46 ml  Output --  Net 2158.46 ml   Filed Weights   04/25/2022 0853  Weight: 80 kg   Body mass index is 28.47 kg/m.  GENERAL: Critically ill appearing HEENT:   Pupils equal round and reactive, fundi  not visualized, oral mucosa unremarkable, disconjugate gaze NECK:    Unable to assess as jugular venous distention, waveform within normal limits, carotid upstroke brisk and symmetric, no bruits, no thyromegaly LYMPHATICS:  No cervical, inguinal adenopathy LUNGS: Decreased breath sounds bilaterally BACK: Unable to assess CHEST:   Unremarkable HEART:  PMI not  displaced or sustained,S1 and S2 within normal limits, no S3, no clicks, no rubs, no murmurs ABD:  Flat, positive bowel sounds normal in frequency in pitch, no bruits, no rebound, no guarding, no midline pulsatile mass, no hepatomegaly, no splenomegaly EXT:  2 plus pulses throughout, no  edema, no cyanosis no clubbing SKIN:  No rashes no nodules NEURO: Moving all extremities, intubated  PSYCH: Unable to assess   EKG:  The EKG was personally reviewed and demonstrates: Atrial fibrillation, rate 239, rightward axis, nonspecific T wave changes Telemetry:  Telemetry was personally reviewed and demonstrates: Atrial fibrillation with rapid rate  Relevant CV Studies: None  Laboratory Data:  Chemistry Recent Labs  Lab 04/28/2022 0813  NA 134*  K 3.1*  CL 99  CO2 21*  GLUCOSE 128*  BUN 22  CREATININE 1.13*  CALCIUM 8.1*  GFRNONAA 54*  ANIONGAP 14    Recent Labs  Lab 04/27/2022 0813  PROT 6.8  ALBUMIN 1.8*  AST 35  ALT 16  ALKPHOS 52  BILITOT 2.0*   Hematology Recent Labs  Lab 04/26/2022 0813  WBC 8.4  RBC 4.36  HGB 13.6  HCT 40.1  MCV 92.0  MCH 31.2  MCHC 33.9  RDW 13.9  PLT 350   Cardiac EnzymesNo results for input(s): "TROPONINI" in the last 168 hours. No results for input(s): "TROPIPOC" in the last 168 hours.  BNPNo results for input(s): "BNP", "PROBNP" in the last 168 hours.  DDimer No results for input(s): "DDIMER" in the last 168 hours.  Radiology/Studies:  DG Chest Portable 1 View  Result Date: 05/08/2022 CLINICAL DATA:  Intubated. EXAM: PORTABLE CHEST 1 VIEW COMPARISON:  Earlier film, same date.  FINDINGS: The endotracheal tube is 3.6 cm above the carina. Persistent diffuse interstitial and airspace process in the lungs. No pneumothorax or large pleural effusion. IMPRESSION: 1. The endotracheal tube is 3.6 cm above the carina. 2. Persistent diffuse interstitial and airspace process. Electronically Signed   By: Marijo Sanes M.D.   On: 05/05/2022 13:34   DG Chest Portable 1 View  Result Date: 05/19/2022 CLINICAL DATA:  sob EXAM: PORTABLE CHEST 1 VIEW COMPARISON:  Chest x-ray August 06, 2020. FINDINGS: Extensive patchy interstitial airspace opacities throughout both lungs. No visible pleural effusions or pneumothorax. Cardiomediastinal silhouette is within normal limits. Polyarticular degenerative change. IMPRESSION: Extensive patchy interstitial airspace opacities throughout both lungs, concerning for multifocal pneumonia. Followup PA and lateral chest X-ray is recommended in 3-4 weeks following trial of antibiotic therapy to ensure resolution and exclude underlying malignancy. Electronically Signed   By: Margaretha Sheffield M.D.   On: 05/05/2022 08:37    Assessment and Plan:   Atrial fib: The patient is critically ill.  She developed atrial fibrillation in the emergency room.  She is currently in the process of being stabilized with vasopressors, paralysis and ventilation.  I agree with IV amiodarone bolus and infusion.  If she remains severely tachycardic following this and further attempts at stabilizing her respiratory status she should have another attempted cardioversion.  I did discuss prognosis with her husband.  Acute respiratory failure: The patient is flu positive.  She has bilateral diffuse infiltrates and is being managed for multifocal pneumonia.  Sepsis: Management per critical care  For questions or updates, please contact [place] Please consult [email or web address] for contact info under Cardiology/STEMI.   Signed, Minus Breeding, MD  05/22/2022 2:09 PM

## 2022-05-04 NOTE — ED Notes (Signed)
Time out for IJ placement central access, husband provided consent.

## 2022-05-04 NOTE — ED Provider Notes (Signed)
Emergency Department Provider Note   I have reviewed the triage vital signs and the nursing notes.   HISTORY  Chief Complaint Respiratory Distress   HPI Katelyn Stevenson is a 66 y.o. female with past history of diabetes, alcohol hepatitis, hypertension, and smoking history presents to the emergency department with increased shortness of breath and hypoxemia.  She is apparently had 4 days of worsening shortness of breath and cough.  Patient denies any prior diagnosis of COPD or asthma but states that she had been a heavy smoker until 3 weeks ago when she quit.  She is not currently taking any prescription medications.  She has had productive cough.  EMS gave 10 mg of albuterol prior to arrival.    Past Medical History:  Diagnosis Date   Alcoholic hepatitis    Anxiety    Chronic back pain    Chronic neck pain    Chronic pain    Diabetes mellitus    Fibromyalgia    Hemochromatosis    Hepatic encephalopathy (HCC)    Hypertension    Thrombocytopenia (HCC)    Thrombus DVT    Review of Systems  Constitutional: No fever/chills Cardiovascular: Positive left chest pain. Respiratory: Positive shortness of breath. Gastrointestinal: No abdominal pain.  No nausea, no vomiting.  No diarrhea.   Genitourinary: Negative for dysuria. Musculoskeletal: Negative for back pain. Skin: Negative for rash. Neurological: Negative for headaches.   ____________________________________________   PHYSICAL EXAM:  VITAL SIGNS: ED Triage Vitals  Enc Vitals Group     BP 05/15/2022 0804 (!) 107/56     Pulse Rate 04/28/2022 0804 (!) 133     Resp 05/11/2022 0804 (!) 40     Temp 04/28/2022 0804 (!) 97.4 F (36.3 C)     Temp Source 05/17/2022 0804 Oral     SpO2 04/30/2022 0805 93 %     Weight 04/28/2022 0853 176 lb 5.9 oz (80 kg)     Height 05/15/2022 0853 5\' 6"  (1.676 m)   Constitutional: Alert and oriented. Significant increased WOB.  Eyes: Conjunctivae are normal.  Head: Atraumatic. Nose: No  congestion/rhinnorhea. Mouth/Throat: Mucous membranes are moist.  Neck: No stridor.  Cardiovascular: Tachycardia. Good peripheral circulation. Grossly normal heart sounds.   Respiratory: Increased respiratory effort.  No retractions. Lungs with rhonchi throughout.  Gastrointestinal: Soft and nontender. No distention.  Musculoskeletal: No lower extremity tenderness nor edema. No gross deformities of extremities. Neurologic:  Normal speech and language. No gross focal neurologic deficits are appreciated.  Skin:  Skin is warm, dry and intact. No rash noted.   ____________________________________________   LABS (all labs ordered are listed, but only abnormal results are displayed)  Labs Reviewed  RESP PANEL BY RT-PCR (RSV, FLU A&B, COVID)  RVPGX2 - Abnormal; Notable for the following components:      Result Value   Influenza A by PCR POSITIVE (*)    All other components within normal limits  COMPREHENSIVE METABOLIC PANEL - Abnormal; Notable for the following components:   Sodium 134 (*)    Potassium 3.1 (*)    CO2 21 (*)    Glucose, Bld 128 (*)    Creatinine, Ser 1.13 (*)    Calcium 8.1 (*)    Albumin 1.8 (*)    Total Bilirubin 2.0 (*)    GFR, Estimated 54 (*)    All other components within normal limits  LACTIC ACID, PLASMA - Abnormal; Notable for the following components:   Lactic Acid, Venous 5.2 (*)  All other components within normal limits  CBC WITH DIFFERENTIAL/PLATELET - Abnormal; Notable for the following components:   Lymphs Abs 0.6 (*)    Abs Immature Granulocytes 0.14 (*)    All other components within normal limits  PROTIME-INR - Abnormal; Notable for the following components:   Prothrombin Time 17.3 (*)    INR 1.4 (*)    All other components within normal limits  POC OCCULT BLOOD, ED - Abnormal; Notable for the following components:   Fecal Occult Bld POSITIVE (*)    All other components within normal limits  C DIFFICILE QUICK SCREEN W PCR REFLEX    CULTURE,  BLOOD (ROUTINE X 2)  CULTURE, BLOOD (ROUTINE X 2)  EXPECTORATED SPUTUM ASSESSMENT W GRAM STAIN, RFLX TO RESP C  LACTIC ACID, PLASMA  URINALYSIS, ROUTINE W REFLEX MICROSCOPIC  TYPE AND SCREEN  TROPONIN I (HIGH SENSITIVITY)   ____________________________________________  EKG   EKG Interpretation  Date/Time:  Wednesday May 04 2022 08:04:46 EST Ventricular Rate:  137 PR Interval:  129 QRS Duration: 102 QT Interval:  316 QTC Calculation: 469 R Axis:   -12 Text Interpretation: Sinus tachycardia Paired ventricular premature complexes Abnormal R-wave progression, late transition Probable RV involvement, suggest recording right precordial leads Confirmed by Nanda Quinton [phone]) on 04/27/2022 9:10:59 AM        ____________________________________________  RADIOLOGY  DG Chest Portable 1 View  Result Date: 04/26/2022 CLINICAL DATA:  sob EXAM: PORTABLE CHEST 1 VIEW COMPARISON:  Chest x-ray August 06, 2020. FINDINGS: Extensive patchy interstitial airspace opacities throughout both lungs. No visible pleural effusions or pneumothorax. Cardiomediastinal silhouette is within normal limits. Polyarticular degenerative change. IMPRESSION: Extensive patchy interstitial airspace opacities throughout both lungs, concerning for multifocal pneumonia. Followup PA and lateral chest X-ray is recommended in 3-4 weeks following trial of antibiotic therapy to ensure resolution and exclude underlying malignancy. Electronically Signed   By: Margaretha Sheffield M.D.   On: 05/05/2022 08:37    ____________________________________________   PROCEDURES  Procedure(s) performed:   Procedure Name: Intubation Date/Time: 05/10/2022 11:57 AM  Performed by: Margette Fast, MDPre-anesthesia Checklist: Patient identified, Patient being monitored, Timeout performed, Emergency Drugs available and Suction available Oxygen Delivery Method: Ambu bag Preoxygenation: 94% Induction Type: Rapid sequence Ventilation: Mask  ventilation without difficulty Laryngoscope Size: Glidescope and 3 Grade View: Grade II Tube size: 7.5 mm Number of attempts: 1 Airway Equipment and Method: Video-laryngoscopy Placement Confirmation: ETT inserted through vocal cords under direct vision, Breath sounds checked- equal and bilateral and CO2 detector Secured at: 23 cm Tube secured with: ETT holder Dental Injury: Teeth and Oropharynx as per pre-operative assessment     .Critical Care  Performed by: Margette Fast, MD Authorized by: Margette Fast, MD   Critical care provider statement:    Critical care time (minutes):  90   Critical care time was exclusive of:  Separately billable procedures and treating other patients and teaching time   Critical care was necessary to treat or prevent imminent or life-threatening deterioration of the following conditions:  Respiratory failure   Critical care was time spent personally by me on the following activities:  Development of treatment plan with patient or surrogate, discussions with consultants, evaluation of patient's response to treatment, examination of patient, ordering and review of laboratory studies, ordering and review of radiographic studies, ordering and performing treatments and interventions, pulse oximetry, re-evaluation of patient's condition, review of old charts, blood draw for specimens, ventilator management and obtaining history from patient or surrogate  I assumed direction of critical care for this patient from another provider in my specialty: no     Care discussed with: admitting provider   .Cardioversion  Date/Time: 05/05/2022 1:03 PM  Performed by: Margette Fast, MD Authorized by: Margette Fast, MD   Consent:    Consent obtained:  Emergent situation Pre-procedure details:    Cardioversion basis:  Emergent   Rhythm:  Atrial fibrillation   Electrode placement:  Anterior-posterior Patient sedated: Yes. Refer to sedation procedure documentation for  details of sedation.  Attempt one:    Cardioversion mode:  Synchronous   Waveform:  Biphasic   Shock (Joules):  100   Shock outcome:  Conversion to normal sinus rhythm (but then returned to A fib RVR.) Post-procedure details:    Post-procedure mental status: intubated and sedated.   Patient tolerance of procedure:  Tolerated well, no immediate complications   ____________________________________________   INITIAL IMPRESSION / ASSESSMENT AND PLAN / ED COURSE  Pertinent labs & imaging results that were available during my care of the patient were reviewed by me and considered in my medical decision making (see chart for details).   This patient is Presenting for Evaluation of SOB, which does require a range of treatment options, and is a complaint that involves a high risk of morbidity and mortality.  The Differential Diagnoses include CAP, COPD/asthma, COVID/Flu, PE, ACS, etc.  Critical Interventions-    Medications  albuterol (VENTOLIN HFA) 108 (90 Base) MCG/ACT inhaler 2 puff (has no administration in time range)  lactated ringers infusion ( Intravenous New Bag/Given 05/11/2022 0928)  levofloxacin (LEVAQUIN) IVPB 750 mg (has no administration in time range)  norepinephrine (LEVOPHED) 4mg  in 238mL (0.016 mg/mL) premix infusion (6 mcg/min Intravenous Rate/Dose Change 05/08/2022 1146)  fentaNYL (SUBLIMAZE) injection 50 mcg (has no administration in time range)  fentaNYL 2575mcg in NS 229mL (70mcg/ml) infusion-PREMIX (has no administration in time range)  fentaNYL (SUBLIMAZE) bolus via infusion 50-100 mcg (has no administration in time range)  midazolam (VERSED) injection 1-2 mg (has no administration in time range)  albuterol (PROVENTIL) (2.5 MG/3ML) 0.083% nebulizer solution (10 mg/hr Nebulization Given 05/20/2022 0849)  lactated ringers bolus 1,000 mL (0 mLs Intravenous Stopped 05/05/2022 1112)  levofloxacin (LEVAQUIN) IVPB 750 mg (0 mg Intravenous Stopped 04/25/2022 1125)  LORazepam  (ATIVAN) injection 0.5 mg (0.5 mg Intravenous Given 05/20/2022 0934)  sodium chloride 0.9 % bolus 1,000 mL (1,000 mLs Intravenous New Bag/Given 04/25/2022 0933)  sodium bicarbonate injection 50 mEq (50 mEq Intravenous Given 05/05/2022 1148)  etomidate (AMIDATE) injection 15 mg (15 mg Intravenous Given 04/27/2022 1149)  succinylcholine (ANECTINE) syringe 100 mg (100 mg Intravenous Given 04/26/2022 1150)    Reassessment after intervention: Patient unable to tolerate BiPAP but initially looking well with HFNC.    I did obtain Additional Historical Information from EMS.  I decided to review pertinent External Data, and in summary no recent ED notes or PCP notes in our system.    Clinical Laboratory Tests Ordered, included Flu A positive. Lactic acid of 5.2. C diff negative. Mild hypokalemia. No leukocytosis.   Radiologic Tests Ordered, included CXR. I independently interpreted the images and agree with radiology interpretation.   Cardiac Monitor Tracing which shows tachycardia.   Social Determinants of Health Risk patient is a smoker.   Consult complete with ICU team. Plan for admit.   Medical Decision Making: Summary:  Patient presents to the emergency department in respiratory distress.  She has soft blood pressures and tachycardia.  She has significant  rhonchi throughout and x-ray seems most consistent with multifocal pneumonia.  She was placed on BiPAP and I started an additional albuterol nebulizer in addition to antibiotics.  Patient reports feeling anxious on BiPAP.  We agreed to remove this temporarily and transition her back to nasal cannula for a trial off of this but she understands that the BiPAP [place]ley her best option to avoid intubation.  She does advise that if we needed to intubate her she would be agreeable to that.   Reevaluation with update and discussion with patient and husband at bedside.   10:59 AM  Patient now with mucousy diarrhea.  She tells me this is the first time she  has had this in the last few days.  This does not appear to be melena.  I will send C. difficile panel and Hemoccult.   11:45 AM  Patient ultimately unable to tolerate additional BiPAP. Discussed with patient and husband that we would move to intubate. Both are in agreement.   12:35 PM Patient with rapid, narrow tachycardia on monitor now. She has a pulse. Rhythm apparently started after versed push for sedation. Rhythm appears narrow.  I performed a synchronized cardioversion with brief return to sinus rhythm but then back to A-fib.  Starting amiodarone bolus/infusion and appreciate cardiology consultation.  Continue with sedation and amiodarone. Made ICU team aware of change in patient status. Cardiology to consult as well.   Patient's presentation is most consistent with acute presentation with potential threat to life or bodily function.   Disposition: admit  ____________________________________________  FINAL CLINICAL IMPRESSION(S) / ED DIAGNOSES  Final diagnoses:  Acute respiratory failure with hypoxia (HCC)  Influenza A    Note:  This document was prepared using Dragon voice recognition software and may include unintentional dictation errors.  Alona Bene, MD, [facility] Emergency Medicine    Kinzie Wickes, Arlyss Repress, MD 04/27/2022 [phone]

## 2022-05-04 NOTE — Progress Notes (Signed)
RT transported pt from ED18 to 3M7 on the vent without any complications.

## 2022-05-04 NOTE — Progress Notes (Addendum)
Pharmacy Antibiotic Note  Katelyn Stevenson is a 66 y.o. female admitted on May 24, 2022 presenting in respiratory distress, concern for pna.  Pharmacy has been consulted for levaquin dosing.  Pt allergy to pcn listed as severe anaphylaxis, no documentation of tolerating cephalosporins in chart/Care everywhere, pt on BiPap not very responsive at this time and unable to clarify so will continue levaquin for now   Plan: Levaquin 750 mg IV q 24h Monitor renal function, mental status and ability to clarify pcn allergy to narrow  Addendum: To add vancomycin Vancomycin 1500 mg IV x 1, then 1250 mg IV q 24h (eAUC 511) Add MRSA PCR F/u renal fxn, Cx/PCR to narrow Vancomycin levels as needed  Height: 5\' 6"  (167.6 cm) Weight: 80 kg (176 lb 5.9 oz) IBW/kg (Calculated) : 59.3  Temp (24hrs), Avg:97.4 F (36.3 C), Min:97.4 F (36.3 C), Max:97.4 F (36.3 C)  Recent Labs  Lab 05/24/22 0813  WBC 8.4  CREATININE 1.13*  LATICACIDVEN 5.2*    Estimated Creatinine Clearance: 52.3 mL/min (A) (by C-G formula based on SCr of 1.13 mg/dL (H)).    Allergies  Allergen Reactions   Penicillins Anaphylaxis and Swelling   Codeine Itching    Benadryl is given to combat symptoms    04/27/2022, PharmD, [facility] Clinical Pharmacist ED Pharmacist Phone # [phone] Norelle 02, 2024 9:54 AM

## 2022-05-04 NOTE — ED Notes (Signed)
MD notified of Lactic acid of 5.2

## 2022-05-04 NOTE — ED Notes (Signed)
Verbal order for temp foley placement.

## 2022-05-04 NOTE — Procedures (Signed)
Arterial Catheter Insertion Procedure Note  MONSERATT LEDIN  [id number]  [birth year]  Date:06-03-2022  Time:2:33 PM    Provider Performing: Posey Boyer    Procedure: Insertion of Arterial Line (33295) with US guidance (18841)   Indication(s) Blood pressure monitoring and/or need for frequent ABGs  Consent Risks of the procedure as well as the alternatives and risks of each were explained to the patient and/or caregiver.  Consent for the procedure was obtained and is signed in the bedside chart  Anesthesia Continuous sedation   Time Out Verified patient identification, verified procedure, site/side was marked, verified correct patient position, special equipment/implants available, medications/allergies/relevant history reviewed, required imaging and test results available.   Sterile Technique Maximal sterile technique including full sterile barrier drape, hand hygiene, sterile gown, sterile gloves, mask, hair covering, sterile ultrasound probe cover (if used).   Procedure Description Area of catheter insertion was cleaned with chlorhexidine and draped in sterile fashion. With real-time ultrasound guidance an arterial catheter was placed into the right femoral artery.  Appropriate arterial tracings confirmed on monitor.     Complications/Tolerance None; patient tolerated the procedure well.   EBL Minimal   Specimen(s) None     Posey Boyer, ANCP [facility] Jessice 12, 2024, 2:35 PM  See Amion for pager If no response to pager, please call PCCM consult pager After 7:00 pm call Elink

## 2022-05-04 NOTE — Progress Notes (Signed)
Critical ABG results given to Dr. Craige Cotta. Verbal order received to increase RR to 35 from 30. Verbal order received to repeat ABG in one hour. Hold off on proning at this time. RT will continue to monitor and be available as needed.

## 2022-05-04 NOTE — ED Notes (Signed)
Attempted cardioversion at 100 joules by Dr.Long. Cardioversion unsuccessful.

## 2022-05-04 NOTE — Progress Notes (Signed)
  Echocardiogram 2D Echocardiogram has been performed.  Katelyn Stevenson 05/17/2022, 5:21 PM

## 2022-05-04 NOTE — Procedures (Signed)
Central Venous Catheter Insertion Procedure Note  MILYNN QUIRION  [id number]  Mar 06, 1956  Date:05/18/2022  Time:2:30 PM   Provider Performing:Brooke Moshe Cipro   Procedure: Insertion of Non-tunneled Central Venous [phone]) with US guidance (32355)   Indication(s) Medication administration  Consent Risks of the procedure as well as the alternatives and risks of each were explained to the patient and/or caregiver.  Consent for the procedure was obtained and is signed in the bedside chart  Anesthesia Continuous sedation  Timeout Verified patient identification, verified procedure, site/side was marked, verified correct patient position, special equipment/implants available, medications/allergies/relevant history reviewed, required imaging and test results available.  Sterile Technique Maximal sterile technique including full sterile barrier drape, hand hygiene, sterile gown, sterile gloves, mask, hair covering, sterile ultrasound probe cover (if used).  Procedure Description  Left IJ attempted first.  Easily accessed but met obstruction around 15 cm in, unable to pass wire.  Therefore given severe hypotension, femoral accessed.  Lung sliding present confirmed by myself and Dr. Halford Chessman.   Area of catheter insertion was cleaned with chlorhexidine and draped in sterile fashion.  With real-time ultrasound guidance a central venous catheter was placed into the right femoral vein. Nonpulsatile blood flow and easy flushing noted in all ports.  The catheter was sutured in place and sterile dressing applied.  Complications/Tolerance None; patient tolerated the procedure well. Chest X-ray is ordered to verify placement for internal jugular or subclavian cannulation.   Chest x-ray is not ordered for femoral cannulation.  EBL Minimal  Specimen(s) None      Kennieth Rad, ANCP [facility] 05/03/2022, 2:33 PM  See Amion for pager If no response to pager, please  call PCCM consult pager After 7:00 pm call Elink

## 2022-05-04 NOTE — ED Notes (Signed)
Attempted to call report unable to at this time, nurse not assigned.

## 2022-05-04 NOTE — ED Triage Notes (Signed)
Pt BIBGEMS from home for increased difficulty breathing x 4 days . Husband tried to bring yesterday did not want to go. Pt this morning on room air 77%, 95-97% on 4 LPM.  Rhonchi bilateral   10 albuterol  RR shallow & 60/min - 4 [place] ETC02 19-20 BP 116/64 manual  ST 130   20 LAC - 400 NS

## 2022-05-05 ENCOUNTER — Inpatient Hospital Stay (HOSPITAL_COMMUNITY): Payer: Medicare HMO

## 2022-05-05 DIAGNOSIS — Z9911 Dependence on respirator [ventilator] status: Secondary | ICD-10-CM

## 2022-05-05 DIAGNOSIS — J9601 Acute respiratory failure with hypoxia: Secondary | ICD-10-CM | POA: Diagnosis not present

## 2022-05-05 DIAGNOSIS — N179 Acute kidney failure, unspecified: Secondary | ICD-10-CM

## 2022-05-05 DIAGNOSIS — R7881 Bacteremia: Secondary | ICD-10-CM | POA: Insufficient documentation

## 2022-05-05 DIAGNOSIS — I4891 Unspecified atrial fibrillation: Secondary | ICD-10-CM | POA: Diagnosis not present

## 2022-05-05 LAB — BLOOD CULTURE ID PANEL (REFLEXED) - BCID2

## 2022-05-05 LAB — POCT I-STAT 7, (LYTES, BLD GAS, ICA,H+H)
Acid-Base Excess: 4 mmol/L — ABNORMAL HIGH (ref 0.0–2.0)
Acid-Base Excess: 4 mmol/L — ABNORMAL HIGH (ref 0.0–2.0)
Acid-Base Excess: 5 mmol/L — ABNORMAL HIGH (ref 0.0–2.0)
Acid-Base Excess: 3 mmol/L — ABNORMAL HIGH (ref 0.0–2.0)
Acid-Base Excess: 3 mmol/L — ABNORMAL HIGH (ref 0.0–2.0)
Acid-Base Excess: 2 mmol/L (ref 0.0–2.0)
Bicarbonate: 33.2 mmol/L — ABNORMAL HIGH (ref 20.0–28.0)
Bicarbonate: 33.6 mmol/L — ABNORMAL HIGH (ref 20.0–28.0)
Bicarbonate: 34.8 mmol/L — ABNORMAL HIGH (ref 20.0–28.0)
Bicarbonate: 32.5 mmol/L — ABNORMAL HIGH (ref 20.0–28.0)
Bicarbonate: 32.5 mmol/L — ABNORMAL HIGH (ref 20.0–28.0)
Bicarbonate: 32.7 mmol/L — ABNORMAL HIGH (ref 20.0–28.0)
Calcium, Ion: 1.05 mmol/L — ABNORMAL LOW (ref 1.15–1.40)
Calcium, Ion: 1.01 mmol/L — ABNORMAL LOW (ref 1.15–1.40)
Calcium, Ion: 1.01 mmol/L — ABNORMAL LOW (ref 1.15–1.40)
Calcium, Ion: 0.97 mmol/L — ABNORMAL LOW (ref 1.15–1.40)
Calcium, Ion: 0.97 mmol/L — ABNORMAL LOW (ref 1.15–1.40)
Calcium, Ion: 0.99 mmol/L — ABNORMAL LOW (ref 1.15–1.40)
HCT: 36 % (ref 36.0–46.0)
HCT: 36 % (ref 36.0–46.0)
HCT: 36 % (ref 36.0–46.0)
HCT: 36 % (ref 36.0–46.0)
HCT: 36 % (ref 36.0–46.0)
HCT: 36 % (ref 36.0–46.0)
Hemoglobin: 12.2 g/dL (ref 12.0–15.0)
Hemoglobin: 12.2 g/dL (ref 12.0–15.0)
Hemoglobin: 12.2 g/dL (ref 12.0–15.0)
Hemoglobin: 12.2 g/dL (ref 12.0–15.0)
Hemoglobin: 12.2 g/dL (ref 12.0–15.0)
Hemoglobin: 12.2 g/dL (ref 12.0–15.0)
O2 Saturation: 95 %
O2 Saturation: 99 %
O2 Saturation: 95 %
O2 Saturation: 97 %
O2 Saturation: 97 %
O2 Saturation: 97 %
Patient temperature: 38.4
Patient temperature: 38.5
Patient temperature: 38.1
Patient temperature: 38.3
Patient temperature: 38.3
Patient temperature: 38.8
Potassium: 3.2 mmol/L — ABNORMAL LOW (ref 3.5–5.1)
Potassium: 3.6 mmol/L (ref 3.5–5.1)
Potassium: 3.7 mmol/L (ref 3.5–5.1)
Potassium: 3.8 mmol/L (ref 3.5–5.1)
Potassium: 3.8 mmol/L (ref 3.5–5.1)
Potassium: 3.7 mmol/L (ref 3.5–5.1)
Sodium: 138 mmol/L (ref 135–145)
Sodium: 138 mmol/L (ref 135–145)
Sodium: 139 mmol/L (ref 135–145)
Sodium: 138 mmol/L (ref 135–145)
Sodium: 138 mmol/L (ref 135–145)
Sodium: 137 mmol/L (ref 135–145)
TCO2: 36 mmol/L — ABNORMAL HIGH (ref 22–32)
TCO2: 36 mmol/L — ABNORMAL HIGH (ref 22–32)
TCO2: 37 mmol/L — ABNORMAL HIGH (ref 22–32)
TCO2: 35 mmol/L — ABNORMAL HIGH (ref 22–32)
TCO2: 35 mmol/L — ABNORMAL HIGH (ref 22–32)
TCO2: 35 mmol/L — ABNORMAL HIGH (ref 22–32)
pCO2 arterial: 82.9 mmHg (ref 32–48)
pCO2 arterial: 80.3 mmHg (ref 32–48)
pCO2 arterial: 80.7 mmHg (ref 32–48)
pCO2 arterial: 79.3 mmHg (ref 32–48)
pCO2 arterial: 79.3 mmHg (ref 32–48)
pCO2 arterial: 91.9 mmHg (ref 32–48)
pH, Arterial: 7.218 — ABNORMAL LOW (ref 7.35–7.45)
pH, Arterial: 7.237 — ABNORMAL LOW (ref 7.35–7.45)
pH, Arterial: 7.249 — ABNORMAL LOW (ref 7.35–7.45)
pH, Arterial: 7.228 — ABNORMAL LOW (ref 7.35–7.45)
pH, Arterial: 7.228 — ABNORMAL LOW (ref 7.35–7.45)
pH, Arterial: 7.17 — CL (ref 7.35–7.45)
pO2, Arterial: 104 mmHg (ref 83–108)
pO2, Arterial: 179 mmHg — ABNORMAL HIGH (ref 83–108)
pO2, Arterial: 100 mmHg (ref 83–108)
pO2, Arterial: 122 mmHg — ABNORMAL HIGH (ref 83–108)
pO2, Arterial: 122 mmHg — ABNORMAL HIGH (ref 83–108)
pO2, Arterial: 129 mmHg — ABNORMAL HIGH (ref 83–108)

## 2022-05-05 LAB — HEPARIN LEVEL (UNFRACTIONATED)
Heparin Unfractionated: <0.1 IU/mL — ABNORMAL LOW (ref 0.30–0.70)
Heparin Unfractionated: 0.12 IU/mL — ABNORMAL LOW (ref 0.30–0.70)

## 2022-05-05 LAB — BASIC METABOLIC PANEL
Anion gap: 13 (ref 5–15)
BUN: 32 mg/dL — ABNORMAL HIGH (ref 8–23)
CO2: 30 mmol/L (ref 22–32)
Calcium: 7.3 mg/dL — ABNORMAL LOW (ref 8.9–10.3)
Chloride: 97 mmol/L — ABNORMAL LOW (ref 98–111)
Creatinine, Ser: 1.98 mg/dL — ABNORMAL HIGH (ref 0.44–1.00)
GFR, Estimated: 27 mL/min — ABNORMAL LOW (ref >60)
Glucose, Bld: 174 mg/dL — ABNORMAL HIGH (ref 70–99)
Potassium: 3.7 mmol/L (ref 3.5–5.1)
Sodium: 140 mmol/L (ref 135–145)

## 2022-05-05 LAB — VANCOMYCIN, RANDOM: Vancomycin Rm: 13 ug/mL

## 2022-05-05 LAB — HEMOGLOBIN A1C
Hgb A1c MFr Bld: 5.9 % — ABNORMAL HIGH (ref 4.8–5.6)
Mean Plasma Glucose: 123 mg/dL

## 2022-05-05 LAB — CBC
HCT: 34.9 % — ABNORMAL LOW (ref 36.0–46.0)
Hemoglobin: 11.6 g/dL — ABNORMAL LOW (ref 12.0–15.0)
MCH: 31.4 pg (ref 26.0–34.0)
MCHC: 33.2 g/dL (ref 30.0–36.0)
MCV: 94.3 fL (ref 80.0–100.0)
Platelets: 133 10*3/uL — ABNORMAL LOW (ref 150–400)
RBC: 3.7 MIL/uL — ABNORMAL LOW (ref 3.87–5.11)
RDW: 14.5 % (ref 11.5–15.5)
WBC: 12.3 10*3/uL — ABNORMAL HIGH (ref 4.0–10.5)
nRBC: 1.5 % — ABNORMAL HIGH (ref 0.0–0.2)

## 2022-05-05 LAB — GLUCOSE, CAPILLARY
Glucose-Capillary: 173 mg/dL — ABNORMAL HIGH (ref 70–99)
Glucose-Capillary: 162 mg/dL — ABNORMAL HIGH (ref 70–99)
Glucose-Capillary: 157 mg/dL — ABNORMAL HIGH (ref 70–99)
Glucose-Capillary: 157 mg/dL — ABNORMAL HIGH (ref 70–99)
Glucose-Capillary: 158 mg/dL — ABNORMAL HIGH (ref 70–99)
Glucose-Capillary: 116 mg/dL — ABNORMAL HIGH (ref 70–99)
Glucose-Capillary: 102 mg/dL — ABNORMAL HIGH (ref 70–99)

## 2022-05-05 LAB — POTASSIUM: Potassium: 3.8 mmol/L (ref 3.5–5.1)

## 2022-05-05 LAB — TROPONIN I (HIGH SENSITIVITY)
Troponin I (High Sensitivity): 95 ng/L — ABNORMAL HIGH (ref <18)
Troponin I (High Sensitivity): 90 ng/L — ABNORMAL HIGH (ref <18)

## 2022-05-05 LAB — MAGNESIUM: Magnesium: 1.8 mg/dL (ref 1.7–2.4)

## 2022-05-05 LAB — PROTIME-INR
INR: 1.9 — ABNORMAL HIGH (ref 0.8–1.2)
Prothrombin Time: 21.5 seconds — ABNORMAL HIGH (ref 11.4–15.2)

## 2022-05-05 LAB — TRIGLYCERIDES: Triglycerides: 172 mg/dL — ABNORMAL HIGH (ref <150)

## 2022-05-05 MED ORDER — EPINEPHRINE 0.1 MG/10ML (10 MCG/ML) SYRINGE FOR IV PUSH (FOR BLOOD PRESSURE SUPPORT): 5.0000 ug | PREFILLED_SYRINGE | Freq: Once | INTRAVENOUS | Status: DC | PRN

## 2022-05-05 MED ORDER — INSULIN ASPART 100 UNIT/ML IJ SOLN
3.0000 [IU] | INTRAMUSCULAR | Status: DC
  Administered 2022-05-05 (×2): 6 [IU] via SUBCUTANEOUS

## 2022-05-05 MED ORDER — AMIODARONE LOAD VIA INFUSION
150.0000 mg | Freq: Once | INTRAVENOUS | Status: AC
  Administered 2022-05-05: 150 mg via INTRAVENOUS
  Filled 2022-05-05: qty 83.34

## 2022-05-05 MED ORDER — OSELTAMIVIR PHOSPHATE 6 MG/ML PO SUSR
30.0000 mg | Freq: Every day | ORAL | Status: DC
  Administered 2022-05-06: 30 mg
  Filled 2022-05-05: qty 12.5

## 2022-05-05 MED ORDER — HEPARIN (PORCINE) 25000 UT/250ML-% IV SOLN
1600.0000 [IU]/h | INTRAVENOUS | Status: DC
  Administered 2022-05-05: 1250 [IU]/h via INTRAVENOUS
  Administered 2022-05-05: 1100 [IU]/h via INTRAVENOUS
  Filled 2022-05-05 (×2): qty 250

## 2022-05-05 MED ORDER — CALCIUM GLUCONATE-NACL 2-0.675 GM/100ML-% IV SOLN
2.0000 g | Freq: Once | INTRAVENOUS | Status: AC
  Administered 2022-05-05: 2000 mg via INTRAVENOUS
  Filled 2022-05-05: qty 100

## 2022-05-05 MED ORDER — AMINOPHYLLINE BOLUS VIA INFUSION: 150.0000 mg | INTRAVENOUS | Status: DC

## 2022-05-05 MED ORDER — SODIUM BICARBONATE 8.4 % IV SOLN
INTRAVENOUS | Status: AC
  Filled 2022-05-05: qty 50

## 2022-05-05 MED ORDER — ARTIFICIAL TEARS OPHTHALMIC OINT
1.0000 | TOPICAL_OINTMENT | Freq: Three times a day (TID) | OPHTHALMIC | Status: DC
  Administered 2022-05-05 – 2022-05-06 (×5): 1 via OPHTHALMIC

## 2022-05-05 MED ORDER — FENTANYL CITRATE PF 50 MCG/ML IJ SOSY: 25.0000 ug | PREFILLED_SYRINGE | Freq: Once | INTRAMUSCULAR | Status: DC

## 2022-05-05 MED ORDER — SODIUM BICARBONATE 8.4 % IV SOLN
100.0000 meq | Freq: Once | INTRAVENOUS | Status: AC
  Administered 2022-05-05: 100 meq via INTRAVENOUS
  Filled 2022-05-05: qty 50

## 2022-05-05 MED ORDER — FAMOTIDINE IN NACL 20-0.9 MG/50ML-% IV SOLN
20.0000 mg | Freq: Two times a day (BID) | INTRAVENOUS | Status: DC
  Administered 2022-05-05 – 2022-05-06 (×3): 20 mg via INTRAVENOUS
  Filled 2022-05-05 (×4): qty 50

## 2022-05-05 MED ORDER — CALCIUM GLUCONATE-NACL 1-0.675 GM/50ML-% IV SOLN
1.0000 g | Freq: Once | INTRAVENOUS | Status: AC
  Administered 2022-05-05: 1000 mg via INTRAVENOUS
  Filled 2022-05-05: qty 50

## 2022-05-05 MED ORDER — FUROSEMIDE 10 MG/ML IJ SOLN
8.0000 mg/h | INTRAVENOUS | Status: DC
  Administered 2022-05-05: 8 mg/h via INTRAVENOUS
  Filled 2022-05-05 (×3): qty 20

## 2022-05-05 MED ORDER — EPINEPHRINE HCL 5 MG/250ML IV SOLN IN NS
0.5000 ug/min | INTRAVENOUS | Status: DC
  Administered 2022-05-06: 20 ug/min via INTRAVENOUS
  Administered 2022-05-06: 10 ug/min via INTRAVENOUS
  Filled 2022-05-05 (×2): qty 250

## 2022-05-05 MED ORDER — MAGNESIUM SULFATE IN D5W 1-5 GM/100ML-% IV SOLN
1.0000 g | Freq: Once | INTRAVENOUS | Status: AC
  Administered 2022-05-05: 1 g via INTRAVENOUS
  Filled 2022-05-05: qty 100

## 2022-05-05 MED ORDER — HEPARIN BOLUS VIA INFUSION
2000.0000 [IU] | Freq: Once | INTRAVENOUS | Status: AC
  Administered 2022-05-05: 2000 [IU] via INTRAVENOUS
  Filled 2022-05-05: qty 2000

## 2022-05-05 MED ORDER — AMIODARONE IV BOLUS ONLY 150 MG/100ML
150.0000 mg | INTRAVENOUS | Status: AC
  Administered 2022-05-05: 150 mg via INTRAVENOUS

## 2022-05-05 MED ORDER — HYDROCORTISONE SOD SUC (PF) 100 MG IJ SOLR
100.0000 mg | Freq: Two times a day (BID) | INTRAMUSCULAR | Status: DC
  Administered 2022-05-05 – 2022-05-06 (×2): 100 mg via INTRAVENOUS
  Filled 2022-05-05 (×3): qty 2

## 2022-05-05 MED ORDER — IPRATROPIUM-ALBUTEROL 0.5-2.5 (3) MG/3ML IN SOLN
3.0000 mL | RESPIRATORY_TRACT | Status: DC
  Administered 2022-05-05 – 2022-05-06 (×5): 3 mL via RESPIRATORY_TRACT
  Filled 2022-05-05 (×5): qty 3

## 2022-05-05 MED ORDER — PROPOFOL 1000 MG/100ML IV EMUL
25.0000 ug/kg/min | INTRAVENOUS | Status: DC
  Administered 2022-05-05 – 2022-05-06 (×6): 25 ug/kg/min via INTRAVENOUS
  Filled 2022-05-05 (×4): qty 100

## 2022-05-05 MED ORDER — FENTANYL BOLUS VIA INFUSION: 25.0000 ug | INTRAVENOUS | Status: DC | PRN

## 2022-05-05 MED ORDER — SODIUM BICARBONATE 8.4 % IV SOLN
100.0000 meq | Freq: Once | INTRAVENOUS | Status: AC
  Administered 2022-05-05: 100 meq via INTRAVENOUS
  Filled 2022-05-05: qty 100

## 2022-05-05 MED ORDER — FENTANYL 2500MCG IN NS 250ML (10MCG/ML) PREMIX INFUSION
25.0000 ug/h | INTRAVENOUS | Status: DC
  Administered 2022-05-05 – 2022-05-06 (×2): 125 ug/h via INTRAVENOUS
  Filled 2022-05-05 (×2): qty 250

## 2022-05-05 MED ORDER — VECURONIUM BROMIDE 10 MG IV SOLR
0.0000 ug/kg/min | Status: DC
  Administered 2022-05-05: 1 ug/kg/min via INTRAVENOUS
  Administered 2022-05-06: 0.4 ug/kg/min via INTRAVENOUS
  Filled 2022-05-05 (×2): qty 100

## 2022-05-05 MED ORDER — METOLAZONE 5 MG PO TABS
5.0000 mg | ORAL_TABLET | Freq: Once | ORAL | Status: AC
  Administered 2022-05-05: 5 mg
  Filled 2022-05-05: qty 1

## 2022-05-05 MED ORDER — VANCOMYCIN HCL 1500 MG/300ML IV SOLN
1500.0000 mg | Freq: Once | INTRAVENOUS | Status: AC
  Administered 2022-05-05: 1500 mg via INTRAVENOUS
  Filled 2022-05-05 (×2): qty 300

## 2022-05-05 MED ORDER — FUROSEMIDE 10 MG/ML IJ SOLN
100.0000 mg | Freq: Once | INTRAVENOUS | Status: AC
  Administered 2022-05-05: 100 mg via INTRAVENOUS
  Filled 2022-05-05: qty 10

## 2022-05-05 MED ORDER — DIPHENHYDRAMINE HCL 50 MG/ML IJ SOLN
25.0000 mg | Freq: Four times a day (QID) | INTRAMUSCULAR | Status: AC
  Administered 2022-05-05 – 2022-05-06 (×4): 25 mg via INTRAVENOUS
  Filled 2022-05-05 (×3): qty 1
  Filled 2022-05-05: qty 0.5

## 2022-05-05 MED ORDER — VANCOMYCIN VARIABLE DOSE PER UNSTABLE RENAL FUNCTION (PHARMACIST DOSING): Status: DC

## 2022-05-05 MED ORDER — VECURONIUM BOLUS VIA INFUSION
10.0000 mg | Freq: Once | INTRAVENOUS | Status: AC
  Administered 2022-05-05: 10 mg via INTRAVENOUS
  Filled 2022-05-05: qty 10

## 2022-05-05 MED ORDER — CEFAZOLIN SODIUM-DEXTROSE 2-4 GM/100ML-% IV SOLN
2.0000 g | Freq: Two times a day (BID) | INTRAVENOUS | Status: DC
  Administered 2022-05-05: 2 g via INTRAVENOUS
  Filled 2022-05-05 (×2): qty 100

## 2022-05-05 MED ORDER — EPINEPHRINE 1 MG/10ML IJ SOSY: 0.2000 mg | PREFILLED_SYRINGE | Freq: Once | INTRAMUSCULAR | Status: AC

## 2022-05-05 MED ORDER — EPINEPHRINE HCL 5 MG/250ML IV SOLN IN NS
INTRAVENOUS | Status: AC
  Administered 2022-05-05: 10 ug/min via INTRAVENOUS
  Filled 2022-05-05: qty 250

## 2022-05-05 MED ORDER — EPINEPHRINE 1 MG/10ML IJ SOSY
PREFILLED_SYRINGE | INTRAMUSCULAR | Status: AC
  Administered 2022-05-05: 0.2 mg via INTRAVENOUS
  Filled 2022-05-05: qty 10

## 2022-05-05 MED ORDER — FUROSEMIDE 10 MG/ML IJ SOLN
60.0000 mg | Freq: Once | INTRAMUSCULAR | Status: AC
  Administered 2022-05-05: 60 mg via INTRAVENOUS
  Filled 2022-05-05: qty 6

## 2022-05-05 MED ORDER — VITAL 1.5 CAL PO LIQD
1000.0000 mL | ORAL | Status: DC
  Administered 2022-05-05: 1000 mL

## 2022-05-05 MED ORDER — LEVOFLOXACIN IN D5W 750 MG/150ML IV SOLN: 750.0000 mg | INTRAVENOUS | Status: DC

## 2022-05-05 NOTE — Progress Notes (Signed)
ANTICOAGULATION CONSULT NOTE   Pharmacy Consult for heparin Indication: arterial occlusion  Allergies  Allergen Reactions   Cefazolin Anaphylaxis    Anaphylactic shock   Penicillins Anaphylaxis and Swelling   Codeine Itching    Benadryl is given to combat symptoms    Patient Measurements: Height: 5\' 6"  (167.6 cm) Weight: 99 kg (218 lb 4.1 oz) IBW/kg (Calculated) : 59.3  Vital Signs: Temp: 101.7 F (38.7 C) (12/14 1900) Temp Source: Bladder (12/14 1800) Pulse Rate: 165 (12/14 1900)  Labs: Recent Labs    05/01/2022 0813 05/13/2022 1458 05/12/2022 1551 05/11/2022 2134 05/22/2022 2345 05/05/22 0435 05/05/22 0459 05/05/22 0900 05/05/22 1024 05/05/22 1026 05/05/22 1222 05/05/22 1549 05/05/22 2235  HGB 13.6  --    < >  --    < > 11.6*   < >  --   --  12.2 12.2  12.2 12.2  --   HCT 40.1  --    < >  --    < > 34.9*   < >  --   --  36.0 36.0  36.0 36.0  --   PLT 350  --   --   --   --  133*  --   --   --   --   --   --   --   LABPROT 17.3*  --   --   --   --  21.5*  --   --   --   --   --   --   --   INR 1.4*  --   --   --   --  1.9*  --   --   --   --   --   --   --   HEPARINUNFRC  --   --   --   --   --   --   --   --  <0.10*  --   --   --  0.12*  CREATININE 1.13* 1.23*  --  1.69*  --  1.98*  --   --   --   --   --   --   --   TROPONINIHS  --  43*  --   --   --   --   --  95* 90*  --   --   --   --    < > = values in this interval not displayed.     Estimated Creatinine Clearance: 33.2 mL/min (A) (by C-G formula based on SCr of 1.98 mg/dL (H)).   Medical History: Past Medical History:  Diagnosis Date   Alcoholic hepatitis    Anxiety    Chronic back pain    Chronic neck pain    Chronic pain    Diabetes mellitus    Fibromyalgia    Hemochromatosis    Hepatic encephalopathy (HCC)    Hypertension    Thrombocytopenia (HCC)    Thrombus DVT    Medications:  Scheduled:   artificial tears  1 Application Both Eyes Q8H   Chlorhexidine Gluconate Cloth  6 each Topical  Q0600   diphenhydrAMINE  25 mg Intravenous Q6H   docusate  100 mg Per Tube BID   fentaNYL (SUBLIMAZE) injection  25 mcg Intravenous Once   hydrocortisone sod succinate (SOLU-CORTEF) inj  100 mg Intravenous Q12H   insulin aspart  3-9 Units Subcutaneous Q4H   ipratropium-albuterol  3 mL Nebulization Q4H   nystatin   Topical TID   mouth rinse  15 mL Mouth Rinse Q2H   [START ON 05-11-22] oseltamivir  30 mg Per Tube Daily   pantoprazole (PROTONIX) IV  40 mg Intravenous Q24H   polyethylene glycol  17 g Per Tube Daily   vancomycin variable dose per unstable renal function (pharmacist dosing)   Does not apply See admin instructions    Assessment: 66 y.o. female with ischemic right hand, possible arterial occlusion, for heparin  12/14 PM update:  Heparin level sub-therapeutic   Goal of Therapy:  Heparin level 0.3-0.7 units/ml Monitor platelets by anticoagulation protocol: Yes   Plan:  Increase heparin to 1350 units/hr Check heparin level in 6-8 hours  Narda Bonds, PharmD, [place] Pharmacist Phone: [phone]

## 2022-05-05 NOTE — Progress Notes (Signed)
ABG ordered and obtained on ventilator settings of VT: 470, RR: 35, FIO2: 70%, and PEEP: 14.  Results given to MD.  No ventilator changes made at this time.     Latest Reference Range & Units 05/05/22 10:26  Sample type  ARTERIAL  pH, Arterial 7.35 - 7.45  7.249 (L)  pCO2 arterial 32 - 48 mmHg 80.7 (HH)  pO2, Arterial 83 - 108 mmHg 100  TCO2 22 - 32 mmol/L 37 (H)  Acid-Base Excess 0.0 - 2.0 mmol/L 5.0 (H)  Bicarbonate 20.0 - 28.0 mmol/L 34.8 (H)  O2 Saturation % 95  Patient temperature  38.1 C  Collection site  art line

## 2022-05-05 NOTE — Progress Notes (Signed)
RT assisted RN and staff with placing pt back in supine position. Suction catheter was able to be passed down the ETT with no apparent issues. Bilateral breath sounds were auscultated.

## 2022-05-05 NOTE — Progress Notes (Signed)
ANTICOAGULATION CONSULT NOTE - Initial Consult  Pharmacy Consult for heparin Indication: arterial occlusion  Allergies  Allergen Reactions   Penicillins Anaphylaxis and Swelling   Codeine Itching    Benadryl is given to combat symptoms    Patient Measurements: Height: 5\' 6"  (167.6 cm) Weight: 80 kg (176 lb 5.9 oz) IBW/kg (Calculated) : 59.3  Vital Signs: Temp: 101.3 F (38.5 C) (12/13 2315) BP: 92/61 (12/13 2300) Pulse Rate: 113 (12/13 2315)  Labs: Recent Labs    May 19, 2022 0813 May 19, 2022 1458 19-May-2022 1551 05-19-22 2048 2022-05-19 2134 May 19, 2022 2345 05/05/22 0226  HGB 13.6  --    < > 12.2  --  12.9 12.2  HCT 40.1  --    < > 36.0  --  38.0 36.0  PLT 350  --   --   --   --   --   --   LABPROT 17.3*  --   --   --   --   --   --   INR 1.4*  --   --   --   --   --   --   CREATININE 1.13* 1.23*  --   --  1.69*  --   --   TROPONINIHS  --  43*  --   --   --   --   --    < > = values in this interval not displayed.    Estimated Creatinine Clearance: 34.9 mL/min (A) (by C-G formula based on SCr of 1.69 mg/dL (H)).   Medical History: Past Medical History:  Diagnosis Date   Alcoholic hepatitis    Anxiety    Chronic back pain    Chronic neck pain    Chronic pain    Diabetes mellitus    Fibromyalgia    Hemochromatosis    Hepatic encephalopathy (HCC)    Hypertension    Thrombocytopenia (HCC)    Thrombus DVT    Medications:  Scheduled:   artificial tears  1 Application Both Eyes Q8H   Chlorhexidine Gluconate Cloth  6 each Topical Q0600   docusate  100 mg Per Tube BID   feeding supplement (VITAL HIGH PROTEIN)  1,000 mL Per Tube Q24H   fentaNYL (SUBLIMAZE) injection  25 mcg Intravenous Once   free water  100 mL Per Tube Q6H   heparin  5,000 Units Subcutaneous Q8H   insulin aspart  0-15 Units Subcutaneous Q4H   methylPREDNISolone (SOLU-MEDROL) injection  40 mg Intravenous Q12H   nystatin   Topical TID   mouth rinse  15 mL Mouth Rinse Q2H   oseltamivir  75 mg Per  Tube BID   pantoprazole (PROTONIX) IV  40 mg Intravenous Q24H   polyethylene glycol  17 g Per Tube Daily    Assessment: 66 y.o. female with ischemic right hand, possible arterial occlusion, for heparin  Goal of Therapy:  Heparin level 0.3-0.7 units/ml Monitor platelets by anticoagulation protocol: Yes   Plan:  D/C SQ heparin  Heparin 2000 units IV bolus, then start heparin 1100 units/hr Check heparin level in 8 hours.    71 05/05/2022,3:48 AM

## 2022-05-05 NOTE — Inpatient Diabetes Management (Signed)
Inpatient Diabetes Program Recommendations  AACE/ADA: New Consensus Statement on Inpatient Glycemic Control (2015)  Target Ranges:  Prepandial:   less than 140 mg/dL      Peak postprandial:   less than 180 mg/dL (1-2 hours)      Critically ill patients:  140 - 180 mg/dL   Lab Results  Component Value Date   GLUCAP 162 (H) 05/05/2022   HGBA1C (H) 10/04/2009    6.9 (NOTE)                                                                       According to the ADA Clinical Practice Recommendations for 2011, when HbA1c is used as a screening test:   >=6.5%   Diagnostic of Diabetes Mellitus           (if abnormal result  is confirmed)  5.7-6.4%   Increased risk of developing Diabetes Mellitus  References:Diagnosis and Classification of Diabetes Mellitus,Diabetes Care,2011,34(Suppl 1):S62-S69 and Standards of Medical Care in         Diabetes - 2011,Diabetes Care,2011,34  (Suppl 1):S11-S61.    Review of Glycemic Control  Latest Reference Range & Units Shanielle 01, 2024 17:12 05/23/2022 19:49 05-23-22 20:19 05/23/2022 23:05 05/05/22 03:36 05/05/22 07:45  Glucose-Capillary 70 - 99 mg/dL 90 47 (L) 206 (H) 015 (H) 173 (H) 162 (H)  (L): Data is abnormally low (H): Data is abnormally high Diabetes history: Type 2 DM Outpatient Diabetes medications: none Current orders for Inpatient glycemic control: Novolog 3-9 units Q4H, Novolog 0-15 units Q4H Solumedrol 40 mg BID  Inpatient Diabetes Program Recommendations:    Noted duplicate correction scales.  Consider discontinuing Novolog 0-15 units Q4H.   Thanks, Lujean Rave, MSN, RNC-OB Diabetes Coordinator [phone] (8a-5p)

## 2022-05-05 NOTE — Progress Notes (Addendum)
Attempted to return call to husband to discuss Mischelle's care. Went to VM immediately-- left a message with the unit phone number for him to call back.  Steffanie Dunn, DO 05/05/22 1:18 PM [facility]    I updated her husband and daughter Neysa Bonito via phone. They understand my concern that she is critically ill, in progressive MOF, and is high risk for not surviving this hospitalization. They are coming to visit as soon as possible.  Steffanie Dunn, DO 05/05/22 2:14 PM [facility]

## 2022-05-05 NOTE — Progress Notes (Signed)
Patient head was turned to the left with no immediate complications.

## 2022-05-05 NOTE — Progress Notes (Addendum)
eLink Physician-Brief Progress Note Patient Name: ALANNI VADER DOB: 02-Mar-1956 MRN: [id number]   Date of Service  05/05/2022  HPI/Events of Note  ABG on 100%/PRVC 35/TV 470/P 12 = 7.13/93/95/30.5 Ionized Ca++ = 1.04. Already on NaHCO3 IV infusion at 100 mL/hour. Nursing concerned about continued deterioration in Oxygenation.   eICU Interventions  Plan: NaHCO3 100 meq IV now. Replace Ca++. Repeat ABG at 5 AM.  NMB via order set.  If oxygenation continues to deteriorate in spite of NMB will consider pronation.      Intervention Category Major Interventions: Acid-Base disturbance - evaluation and management;Respiratory failure - evaluation and management  Maebel Marasco Eugene 05/05/2022, 12:11 AM

## 2022-05-05 NOTE — Progress Notes (Signed)
eLink Physician-Brief Progress Note Patient Name: Katelyn Stevenson DOB: [birth year] MRN: [id number]   Date of Service  05/05/2022  HPI/Events of Note  Multiple issues: 1. ABG on 100%/PRVC 35/TV 470/P 12 = 7.23/80.3/179/33.6. Already on a NaHCO3 IV infusion at 100 mL/hour. 2. Hypocalcemia - Ionized Ca++ = 1.01.  eICU Interventions  Plan: NaHCO3 100 meq IV now.  Replace Ca++. Repeat ABG at 10 AM.     Intervention Category Major Interventions: Respiratory failure - evaluation and management  Kaylia Winborne Eugene 05/05/2022, 5:18 AM

## 2022-05-05 NOTE — Consult Note (Signed)
Hospital Consult    Reason for Consult: Right hand mottled Requesting Physician: Dr. Jayme Cloud MRN #:  [id number]  History of Present Illness: This is a 66 y.o. female with history outlined below who presented with a 4-day history of worsening shortness of breath and cough.  On arrival, Rosanne Ashing began to desaturate requiring intubation.  She has continued to clinically decline, and overnight required proning in an effort to improve oxygenation.  Vascular surgery was called overnight due to mottled right hand, right foot. Pressor requirement Levophed 40 mcg/min  Past Medical History:  Diagnosis Date   Alcoholic hepatitis    Anxiety    Chronic back pain    Chronic neck pain    Chronic pain    Diabetes mellitus    Fibromyalgia    Hemochromatosis    Hepatic encephalopathy (HCC)    Hypertension    Thrombocytopenia (HCC)    Thrombus DVT    Past Surgical History:  Procedure Laterality Date   ROTATOR CUFF REPAIR      Allergies  Allergen Reactions   Penicillins Anaphylaxis and Swelling   Codeine Itching    Benadryl is given to combat symptoms    Prior to Admission medications   Medication Sig Start Date End Date Taking? Authorizing Provider  naproxen sodium (ALEVE) 220 MG tablet Take 220 mg by mouth daily as needed (headache, pain).   Yes [provider]    Social History   Socioeconomic History   Marital status: Married    Spouse name: Not on file   Number of children: Not on file   Years of education: Not on file   Highest education level: Not on file  Occupational History   Not on file  Tobacco Use   Smoking status: Every Day    Types: Cigarettes   Smokeless tobacco: Never  Substance and Sexual Activity   Alcohol use: No   Drug use: No   Sexual activity: Not on file  Other Topics Concern   Not on file  Social History Narrative   Not on file   Social Determinants of Health   Financial Resource Strain: Not on file  Food Insecurity: Not on file   Transportation Needs: Not on file  Physical Activity: Not on file  Stress: Not on file  Social Connections: Not on file  Intimate Partner Violence: Not on file   History reviewed. No pertinent family history.  ROS: Otherwise negative unless mentioned in HPI  Physical Examination  Vitals:   05/05/22 0748 05/05/22 0800  BP:    Pulse:    Resp:  (!) 35  Temp: (!) 100.9 F (38.3 C) (!) 100.9 F (38.3 C)  SpO2:     Body mass index is 28.47 kg/m.  General: Intubated, sedated, prone Gait: Not observed HENT: WNL, normocephalic Pulmonary: Intubated Cardiac: Tachycardic Abdomen: Not assessed Skin: No rashes Vascular Exam/Pulses: No signal appreciated in the right wrist, right foot Extremities: with ischemic changes, without Gangrene , without cellulitis; without open wounds;  Mottling of right hand, right foot Musculoskeletal: no muscle wasting or atrophy  Neurologic: Sedated Psychiatric: Sedated Lymph:  Unremarkable  CBC    Component Value Date/Time   WBC 12.3 (H) 05/05/2022 0435   RBC 3.70 (L) 05/05/2022 0435   HGB 12.2 05/05/2022 0459   HCT 36.0 05/05/2022 0459   PLT 133 (L) 05/05/2022 0435   MCV 94.3 05/05/2022 0435   MCH 31.4 05/05/2022 0435   MCHC 33.2 05/05/2022 0435   RDW 14.5 05/05/2022 0435  LYMPHSABS 0.6 (L) 05/09/2022 0813   MONOABS 0.3 04/26/2022 0813   EOSABS 0.0 04/26/2022 0813   BASOSABS 0.0 04/22/2022 0813    BMET    Component Value Date/Time   NA 138 05/05/2022 0459   K 3.6 05/05/2022 0459   CL 97 (L) 05/05/2022 0435   CO2 30 05/05/2022 0435   GLUCOSE 174 (H) 05/05/2022 0435   BUN 32 (H) 05/05/2022 0435   CREATININE 1.98 (H) 05/05/2022 0435   CALCIUM 7.3 (L) 05/05/2022 0435   GFRNONAA 27 (L) 05/05/2022 0435   GFRAA >60 02/09/2015 1718    COAGS: Lab Results  Component Value Date   INR 1.9 (H) 05/05/2022   INR 1.4 (H) 05/07/2022   INR 1.0 08/06/2020    ASSESSMENT/PLAN: This is a 66 y.o. female who presented with acute  respiratory distress, now intubated and proned in an effort to improve oxygenation.  She is in shock, and has a significant pressor requirement.  I am unsure as to the etiology of her right hand and foot mottling.  There is likely an embolic phenomenon that occurred as the contralateral side is not affected.  She is not an operative candidate at this time.  I will continue to follow her clinical progression.  High likelihood of mortality given her current clinical status. Recommend continuing heparin drip I will call Honestii's husband and discuss the above.  Cassandria Santee MD [facility] [phone] 05/05/2022  8:15 AM

## 2022-05-05 NOTE — Progress Notes (Signed)
Pharmacy Antibiotic Note  Katelyn Stevenson is a 66 y.o. female admitted on 05/05/2022 with concern for PNA - found to have influenza and MSSA bacteremia. Pharmacy has been consulted for Cefazolin dosing.  Noted history of penicillin allergy of anaphylaxis/swelling at least 10 years ago with no other information (unable to clarify from patient) or documentation in the chart. No record of PCN/cephalosporin use.   Given unique side chain of Cefazolin - reasonable to trial given preferred therapy for MSSA. CCM team agreeable since patient already intubated and can be monitored for reaction.  Noted AKI with SCr rising and up to 1.98, will renally reduce.  ADDENDUM: Patient appears to have gone into anaphylactic shock from Cefazolin.  Switch back to Vanc Vanc random 13  Addendum Plan: Vanc 1500 mg IV x 1 Vanc per untable renal function - Will monitor renal fxn and obtain vanc levels as needed  Height: 5\' 6"  (167.6 cm) Weight: 99 kg (218 lb 4.1 oz) IBW/kg (Calculated) : 59.3  Temp (24hrs), Avg:100.8 F (38.2 C), Min:99.7 F (37.6 C), Max:101.5 F (38.6 C)  Recent Labs  Lab 04/24/2022 0813 05/02/2022 1141 05/01/2022 1458 05/09/2022 2134 05/05/22 0435 05/05/22 1328  WBC 8.4  --   --   --  12.3*  --   CREATININE 1.13*  --  1.23* 1.69* 1.98*  --   LATICACIDVEN 5.2* 5.1*  --   --   --   --   VANCORANDOM  --   --   --   --   --  13     Estimated Creatinine Clearance: 33.2 mL/min (A) (by C-G formula based on SCr of 1.98 mg/dL (H)).    Allergies  Allergen Reactions   Cefazolin Anaphylaxis    Anaphylactic shock   Penicillins Anaphylaxis and Swelling   Codeine Itching    Benadryl is given to combat symptoms    Antimicrobials this admission: Vancomycin 12/13 x 1 Levaquin 12/13 x 1 Tamiflu 12/14 >> Cefazolin 12/14 >>12/14 Vanc 12/14 x 1  Dose adjustments this admission: N/a  Microbiology results: 12/13 Influenza A positive 12/13 CDiff >> neg 12/13 MRSA PCR >> neg 12/13 BCx 2/4  >> MSSA  Thank you for allowing pharmacy to be a part of this patient's care.  1/14, PharmD, [facility] Clinical Pharmacist Please see AMION for all Pharmacists' Contact Phone Numbers 05/05/2022, 2:41 PM

## 2022-05-05 NOTE — Progress Notes (Signed)
ANTICOAGULATION CONSULT NOTE - Initial Consult  Pharmacy Consult for heparin Indication: arterial occlusion  Allergies  Allergen Reactions   Cefazolin Anaphylaxis    Anaphylactic shock   Penicillins Anaphylaxis and Swelling   Codeine Itching    Benadryl is given to combat symptoms    Patient Measurements: Height: 5\' 6"  (167.6 cm) Weight: 80 kg (176 lb 5.9 oz) IBW/kg (Calculated) : 59.3  Vital Signs: Temp: 101.1 F (38.4 C) (12/14 1300) Temp Source: Bladder (12/14 0800) BP: 86/32 (12/14 0200) Pulse Rate: 142 (12/14 1300)  Labs: Recent Labs    05/16/2022 0813 05/08/2022 1458 04/27/2022 1551 05/17/2022 2134 05/15/2022 2345 05/05/22 0435 05/05/22 0459 05/05/22 0900 05/05/22 1024 05/05/22 1026 05/05/22 1222  HGB 13.6  --    < >  --    < > 11.6* 12.2  --   --  12.2 12.2  12.2  HCT 40.1  --    < >  --    < > 34.9* 36.0  --   --  36.0 36.0  36.0  PLT 350  --   --   --   --  133*  --   --   --   --   --   LABPROT 17.3*  --   --   --   --  21.5*  --   --   --   --   --   INR 1.4*  --   --   --   --  1.9*  --   --   --   --   --   HEPARINUNFRC  --   --   --   --   --   --   --   --  <0.10*  --   --   CREATININE 1.13* 1.23*  --  1.69*  --  1.98*  --   --   --   --   --   TROPONINIHS  --  43*  --   --   --   --   --  95*  --   --   --    < > = values in this interval not displayed.     Estimated Creatinine Clearance: 29.8 mL/min (A) (by C-G formula based on SCr of 1.98 mg/dL (H)).   Medical History: Past Medical History:  Diagnosis Date   Alcoholic hepatitis    Anxiety    Chronic back pain    Chronic neck pain    Chronic pain    Diabetes mellitus    Fibromyalgia    Hemochromatosis    Hepatic encephalopathy (HCC)    Hypertension    Thrombocytopenia (HCC)    Thrombus DVT    Medications:  Scheduled:   artificial tears  1 Application Both Eyes [id number]   Chlorhexidine Gluconate Cloth  6 each Topical Q0600   diphenhydrAMINE  25 mg Intravenous Q6H   docusate  100 mg Per  Tube BID   fentaNYL (SUBLIMAZE) injection  25 mcg Intravenous Once   hydrocortisone sod succinate (SOLU-CORTEF) inj  100 mg Intravenous Q12H   insulin aspart  3-9 Units Subcutaneous Q4H   nystatin   Topical TID   mouth rinse  15 mL Mouth Rinse Q2H   oseltamivir  75 mg Per Tube BID   pantoprazole (PROTONIX) IV  40 mg Intravenous Q24H   polyethylene glycol  17 g Per Tube Daily   vancomycin variable dose per unstable renal function (pharmacist dosing)   Does not apply See  admin instructions    Assessment: 66 y.o. female with ischemic right hand, possible arterial occlusion, for heparin  Heparin level undetectable  Goal of Therapy:  Heparin level 0.3-0.7 units/ml Monitor platelets by anticoagulation protocol: Yes   Plan:  Increase heparin 1250 units/hr Check heparin level in 8 hours.    Jeanella Cara, PharmD, [facility] Clinical Pharmacist Please see AMION for all Pharmacists' Contact Phone Numbers 05/05/2022, 1:50 PM

## 2022-05-05 NOTE — Progress Notes (Addendum)
Initial Nutrition Assessment  DOCUMENTATION CODES:   Not applicable  INTERVENTION:   - Plan Cortrak placement tomorrow, 2022-06-03, as proning schedule allows  Initiate trickle tube feeds via OG tube: - Vital 1.5 @ 20 ml/hr (480 ml/day)   RD will monitor for ability to advance tube feeding regimen to goal: - Vital 1.5 @ 60 ml/hr (1440 ml/day) - PROSource TF20 60 ml daily  Tube feeding regimen provides 2240 kcal, 117 grams of protein, and 1100 ml of H2O.  NUTRITION DIAGNOSIS:   Inadequate oral intake related to inability to eat as evidenced by NPO status.  GOAL:   Patient will meet greater than or equal to 90% of their needs  MONITOR:   Vent status, Labs, Weight trends, TF tolerance, Skin, I & O's  REASON FOR ASSESSMENT:   Ventilator, Consult Enteral/tube feeding initiation and management  ASSESSMENT:   66 year old female who presented to the ED on 12/13 with cough, fever, sinus congestion, rhinorrhea. PMH of T2DM, alcoholic hepatitis, anxiety, HTN, EtOH abuse, tobacco abuse. Pt admitted with acute hypoxic respiratory failure with ARDS from influenza A pneumonia and MSSA pneumonia, septic shock, AKI.  12/14 - pt proned  Discussed pt with RN and during ICU rounds. Consult received for enteral nutrition initiation and management. Pt with OG tube in the gastric antrum per abdominal x-ray. Pt may require CRRT.  Pt proned at time of RD visit. Plan is to supinate at 2000 today. Unable to completed full NFPE. Suspect pt with malnutrition but unable to confirm at this time.  Unable to obtain diet and weight history at this time. Reviewed weight history in chart. Current weight up 3 kg compared to last available weight from 08/06/20 of 77.1 kg. However, pt currently with mild generalized edema.  Plan for post-pyloric Cortrak placement tomorrow if pt is supine during Cortrak team hours.  Admit weight: 80 kg  Patient is currently intubated on ventilator support MV: 15.9  L/min Temp (24hrs), Avg:100.8 F (38.2 C), Min:99.7 F (37.6 C), Max:102 F (38.9 C) BP (a-line): 133/45 MAP (a-line): 65  Drips: Propofol: 25 mcg/kg/min (provides 316 kcal daily from lipid) Fentanyl Vecuronium Amiodarone Heparin Sodium bicarb in D5: 100 ml/hr Levophed: 38 mcg/min Vasopressin: 0.03 units/min  Medications reviewed and include: colace, SSI q 4 hours, IV solu-medrol, tamiflu, IV protonix, miralax, IV lasix, IV abx  Labs reviewed: BUN 32, creaitnine 1.98, ionized calcium 1.01, TG 172, lactic acid 5.1 on 05/19/2022, WBC 12.3, platelets 133 CBG's: 47-326 x 24 hours  UOP: 40 ml x 12 hours I/O's: +6.8 L since admit  NUTRITION - FOCUSED PHYSICAL EXAM:  Flowsheet Row Most Recent Value  Orbital Region Mild depletion  Upper Arm Region No depletion  Thoracic and Lumbar Region Unable to assess  [pt proned]  Buccal Region Unable to assess  [ET tube holder]  Temple Region Mild depletion  Clavicle Bone Region Unable to assess  [pt proned]  Clavicle and Acromion Bone Region Moderate depletion  Scapular Bone Region Moderate depletion  Dorsal Hand Mild depletion  Patellar Region Unable to assess  [pt proned]  Anterior Thigh Region Unable to assess  [pt proned]  Posterior Calf Region Moderate depletion  Edema (RD Assessment) Mild  Hair Reviewed  Eyes Reviewed  Mouth Unable to assess  Skin Reviewed  Nails Reviewed       Diet Order:   Diet Order             Diet NPO time specified  Diet effective now  EDUCATION NEEDS:   Not appropriate for education at this time  Skin:  Skin Assessment: Skin Integrity Issues: Other: weeping wounds to L pelvis  Last BM:  May 29, 2022  Height:   Ht Readings from Last 1 Encounters:  07-Zuleika-2024 5\' 6"  (1.676 m)    Weight:   Wt Readings from Last 1 Encounters:  05/05/22 80 kg    Ideal Body Weight:  59.1 kg  BMI:  Body mass index is 28.47 kg/m.  Estimated Nutritional Needs:   Kcal:   2050-2250  Protein:  110-130 grams  Fluid:  >1.8 L    12-23-1978, MS, RD, LDN Inpatient Clinical Dietitian Please see AMiON for contact information.

## 2022-05-05 NOTE — Progress Notes (Signed)
Patient is hypercarbic, maxed on ventilator settings (TV 470, Ppressure 35, 100% fio2, RR, sat now 95%).   Has been started on paralytic.  On levophed and vaso.    Plan to prone patient.    R hand is cold, mottled, no cap refill.  No pulse distal to AC, no visible flow on [place] no audible doppler.   Called vascular surgery, not a surgical candidate, starting heparin.

## 2022-05-05 NOTE — Progress Notes (Signed)
eLink Physician-Brief Progress Note Patient Name: Katelyn Stevenson DOB: Jan 11, 1956 MRN: [id number]   Date of Service  05/05/2022  HPI/Events of Note  Hypocalcemia  Hypomagnesemia - ionized Ca++ = 1.01 and has already been replaced. Mg++ = 1.8. K+ = 3.7 and Creatinine = 1.98. GFR = 27.   eICU Interventions  Plan: Will replace Mg++. Will hold off replacing K+ d/t renal dysfunction.     Intervention Category Major Interventions: Electrolyte abnormality - evaluation and management  Lenell Antu 05/05/2022, 6:37 AM

## 2022-05-05 NOTE — Consult Note (Addendum)
Katelyn Stevenson for Infectious Disease    Date of Admission:  05/14/2022     Total days of antibiotics                Reason for Consult: MSSA Bacteremia   Referring Provider: [employer] Primary Care Provider: Stephens Shire, MD   ASSESSMENT:  Katelyn Stevenson is a 66 y/o female admitted acute respiratory failure requiring intubation and found to have influenza A and MSSA bacteremia. Requiring vasopressors and mechanical ventilation and now in prone positioning secondary to worsening ventilation. Suspect source of infection as post-viral from influenza although cannot rule out underlying endocarditis or other source at this time. Course complicated by development of atrial fibrillation and now on amiodarone and cyanosis of her right hand and right concerning for possible embolic event or endocarditis. TTE has been negative. Currently unstable for TEE. Renal function is tenuous and will discontinue vancomycin and levofloxacin. Penicillin allergy noted in chart and given current intubated status will challenge with Cefazolin and monitor for any allergic reactions. Continue oseltamivir for influenza. Repeat blood cultures have been ordered. Will need line holiday when deemed appropriate by Critical Care. Prognosis appears poor at this point.    PLAN:  Change antibiotics to Cefazolin which is a challenge to documented Penicillin allergy from 2013.  Monitor repeat cultures for clearance of bacteremia Line holiday when deemed appropriate per CCM.  Mechanical ventilation and vasopressor support per CCM.    Principal Problem:   Respiratory failure (HCC) Active Problems:   Influenza A   MSSA bacteremia   Septic shock (HCC)    artificial tears  1 Application Both Eyes [id number]   Chlorhexidine Gluconate Cloth  6 each Topical Q0600   docusate  100 mg Per Tube BID   fentaNYL (SUBLIMAZE) injection  25 mcg Intravenous Once   insulin aspart  3-9 Units Subcutaneous Q4H    methylPREDNISolone (SOLU-MEDROL) injection  40 mg Intravenous Q12H   nystatin   Topical TID   mouth rinse  15 mL Mouth Rinse Q2H   oseltamivir  75 mg Per Tube BID   pantoprazole (PROTONIX) IV  40 mg Intravenous Q24H   polyethylene glycol  17 g Per Tube Daily     HPI: Katelyn Stevenson is a 66 y.o. female with previous medical history of hypertension, fibromyalgia, diabetes and chronic pain admitted to the hospital from home with 4 day history of increasing shortness of breath.  Katelyn Stevenson was hypoxic on arrival with SpO2 of 77% on room air. Chest x-ray with patchy interstitial airspace opacities throughout bilateral lungs and concern for ARDS. Influnenza A test positive. Intubated for acute hypoxic respiratory failure and required vasopressors for hypotension. Developed atrial fibrillation in the ED and started on amiodarone. Course complicated by continued respiratory decompensation and now prone. Blood cultures positive for MSSA. Initially started on vancomycin, levofloxacin and oseltamivir. Right hand and foot with more cyanotic appearance. TTE without evidence of vegetation. Lab work with acute renal injury with most recent creatinine of 1.98 and leukocytosis of 12.3. Repeat blood culture drawn earlier today. Multiple lines present including CVC triple lumen and arterial line in right femoral.   Katelyn Stevenson is currently sedated and paralyzed and in prone position. History is obtained primarily from chart review.   Review of Systems: Review of Systems  Unable to perform ROS: Intubated     Past Medical History:  Diagnosis Date   Alcoholic hepatitis    Anxiety    Chronic back pain  Chronic neck pain    Chronic pain    Diabetes mellitus    Fibromyalgia    Hemochromatosis    Hepatic encephalopathy (HCC)    Hypertension    Thrombocytopenia (HCC)    Thrombus DVT    Social History   Tobacco Use   Smoking status: Every Day    Types: Cigarettes   Smokeless tobacco: Never   Substance Use Topics   Alcohol use: No   Drug use: No    History reviewed. No pertinent family history.  Allergies  Allergen Reactions   Penicillins Anaphylaxis and Swelling   Codeine Itching    Benadryl is given to combat symptoms    OBJECTIVE: Blood pressure (!) 86/32, pulse (!) 104, temperature (!) 100.6 F (38.1 C), resp. rate (!) 35, height 5\' 6"  (1.676 m), weight 80 kg, SpO2 98 %.  Physical Exam Constitutional:      General: She is not in acute distress.    Appearance: She is well-developed. She is ill-appearing and toxic-appearing.     Interventions: She is sedated and intubated.  Cardiovascular:     Rate and Rhythm: Normal rate and regular rhythm.     Heart sounds: Normal heart sounds.     Comments: Cyanosis of right hand and right foot. No Osler's nodes or Janeway lsions noted.  Pulmonary:     Effort: Pulmonary effort is normal. She is intubated.     Breath sounds: Normal breath sounds.  Skin:    General: Skin is warm and dry.     Lab Results Lab Results  Component Value Date   WBC 12.3 (H) 05/05/2022   HGB 12.2 05/05/2022   HCT 36.0 05/05/2022   MCV 94.3 05/05/2022   PLT 133 (L) 05/05/2022    Lab Results  Component Value Date   CREATININE 1.98 (H) 05/05/2022   BUN 32 (H) 05/05/2022   NA 139 05/05/2022   K 3.7 05/05/2022   CL 97 (L) 05/05/2022   CO2 30 05/05/2022    Lab Results  Component Value Date   ALT 16 05/18/2022   AST 35 05/18/2022   ALKPHOS 52 05/15/2022   BILITOT 2.0 (H) 05/19/2022     Microbiology: Recent Results (from the past 240 hour(s))  Culture, blood (Routine x 2)     Status: Abnormal (Preliminary result)   Collection Time: 04/25/2022  8:13 AM   Specimen: BLOOD LEFT ARM  Result Value Ref Range Status   Specimen Description BLOOD LEFT ARM  Final   Special Requests   Final    BOTTLES DRAWN AEROBIC AND ANAEROBIC Blood Culture adequate volume   Culture  Setup Time   Final    GRAM POSITIVE COCCI IN CLUSTERS AEROBIC BOTTLE  ONLY  PHARMD Katelyn Stevenson 05/05/22 @ 0107 BY AB    Culture (A)  Final    STAPHYLOCOCCUS AUREUS CULTURE REINCUBATED FOR BETTER GROWTH Performed at [facility], [place] [address] [place], [place] [address]    Report Status PENDING  Incomplete  Blood Culture ID Panel (Reflexed)     Status: Abnormal   Collection Time: 05/17/2022  8:13 AM  Result Value Ref Range Status   Enterococcus faecalis NOT DETECTED NOT DETECTED Final   Enterococcus Faecium NOT DETECTED NOT DETECTED Final   Listeria monocytogenes NOT DETECTED NOT DETECTED Final   Staphylococcus species DETECTED (A) NOT DETECTED Final    Comment: CRITICAL RESULT CALLED TO, READ BACK BY AND VERIFIED WITH:  PHARMD Katelyn Stevenson 05/05/22 @ 0107 BY AB    Staphylococcus  aureus (BCID) DETECTED (A) NOT DETECTED Final    Comment: CRITICAL RESULT CALLED TO, READ BACK BY AND VERIFIED WITH:  PHARMD Katelyn Stevenson 05/05/22 @ 0107 BY AB    Staphylococcus epidermidis NOT DETECTED NOT DETECTED Final   Staphylococcus lugdunensis NOT DETECTED NOT DETECTED Final   Streptococcus species NOT DETECTED NOT DETECTED Final   Streptococcus agalactiae NOT DETECTED NOT DETECTED Final   Streptococcus pneumoniae NOT DETECTED NOT DETECTED Final   Streptococcus pyogenes NOT DETECTED NOT DETECTED Final   A.calcoaceticus-baumannii NOT DETECTED NOT DETECTED Final   Bacteroides fragilis NOT DETECTED NOT DETECTED Final   Enterobacterales NOT DETECTED NOT DETECTED Final   Enterobacter cloacae complex NOT DETECTED NOT DETECTED Final   Escherichia coli NOT DETECTED NOT DETECTED Final   Klebsiella aerogenes NOT DETECTED NOT DETECTED Final   Klebsiella oxytoca NOT DETECTED NOT DETECTED Final   Klebsiella pneumoniae NOT DETECTED NOT DETECTED Final   Proteus species NOT DETECTED NOT DETECTED Final   Salmonella species NOT DETECTED NOT DETECTED Final   Serratia marcescens NOT DETECTED NOT DETECTED Final   Haemophilus influenzae NOT DETECTED NOT DETECTED Final   Neisseria meningitidis  NOT DETECTED NOT DETECTED Final   Pseudomonas aeruginosa NOT DETECTED NOT DETECTED Final   Stenotrophomonas maltophilia NOT DETECTED NOT DETECTED Final   Candida albicans NOT DETECTED NOT DETECTED Final   Candida auris NOT DETECTED NOT DETECTED Final   Candida glabrata NOT DETECTED NOT DETECTED Final   Candida krusei NOT DETECTED NOT DETECTED Final   Candida parapsilosis NOT DETECTED NOT DETECTED Final   Candida tropicalis NOT DETECTED NOT DETECTED Final   Cryptococcus neoformans/gattii NOT DETECTED NOT DETECTED Final   Meth resistant mecA/C and MREJ NOT DETECTED NOT DETECTED Final    Comment: Performed at [facility], [address], [place], [place] [address]  Culture, blood (Routine x 2)     Status: None (Preliminary result)   Collection Time: 04/25/2022  8:18 AM   Specimen: BLOOD  Result Value Ref Range Status   Specimen Description BLOOD SITE NOT SPECIFIED  Final   Special Requests   Final    BOTTLES DRAWN AEROBIC AND ANAEROBIC Blood Culture results may not be optimal due to an inadequate volume of blood received in culture bottles   Culture  Setup Time   Final    GRAM POSITIVE COCCI IN CLUSTERS AEROBIC BOTTLE ONLY CRITICAL VALUE NOTED.  VALUE IS CONSISTENT WITH PREVIOUSLY REPORTED AND CALLED VALUE.    Culture   Final    NO GROWTH 1 DAY Performed at [facility], [place] [address] [place], [place] [address]    Report Status PENDING  Incomplete  Resp panel by RT-PCR (RSV, Flu A&B, Covid) Anterior Nasal Swab     Status: Abnormal   Collection Time: 05/02/2022  8:19 AM   Specimen: Anterior Nasal Swab  Result Value Ref Range Status   SARS Coronavirus 2 by RT PCR NEGATIVE NEGATIVE Final    Comment: (NOTE) SARS-CoV-2 target nucleic acids are NOT DETECTED.  The SARS-CoV-2 RNA is generally detectable in upper respiratory specimens during the acute phase of infection. The lowest concentration of SARS-CoV-2 viral copies this assay can detect is 138 copies/mL. A negative  result does not preclude SARS-Cov-2 infection and should not be used as the sole basis for treatment or other patient management decisions. A negative result may occur with  improper specimen collection/handling, submission of specimen other than nasopharyngeal swab, presence of viral mutation(s) within the areas targeted by this assay, and  inadequate number of viral copies(<138 copies/mL). A negative result must be combined with clinical observations, patient history, and epidemiological information. The expected result is Negative.  Fact Sheet for Patients:  BloggerCourse.com  Fact Sheet for Healthcare Providers:  SeriousBroker.it  This test is no t yet approved or cleared by the [place] FDA and  has been authorized for detection and/or diagnosis of SARS-CoV-2 by FDA under an Emergency Use Authorization (EUA). This EUA will remain  in effect (meaning this test can be used) for the duration of the COVID-19 declaration under Section 564(b)(1) of the Act, 21 U.S.C.section 360bbb-3(b)(1), unless the authorization is terminated  or revoked sooner.       Influenza A by PCR POSITIVE (A) NEGATIVE Final   Influenza B by PCR NEGATIVE NEGATIVE Final    Comment: (NOTE) The Xpert Xpress SARS-CoV-2/FLU/RSV plus assay is intended as an aid in the diagnosis of influenza from Nasopharyngeal swab specimens and should not be used as a sole basis for treatment. Nasal washings and aspirates are unacceptable for Xpert Xpress SARS-CoV-2/FLU/RSV testing.  Fact Sheet for Patients: BloggerCourse.com  Fact Sheet for Healthcare Providers: SeriousBroker.it  This test is not yet approved or cleared by the [place] FDA and has been authorized for detection and/or diagnosis of SARS-CoV-2 by FDA under an Emergency Use Authorization (EUA). This EUA will remain in effect (meaning this test can be  used) for the duration of the COVID-19 declaration under Section 564(b)(1) of the Act, 21 U.S.C. section 360bbb-3(b)(1), unless the authorization is terminated or revoked.     Resp Syncytial Virus by PCR NEGATIVE NEGATIVE Final    Comment: (NOTE) Fact Sheet for Patients: BloggerCourse.com  Fact Sheet for Healthcare Providers: SeriousBroker.it  This test is not yet approved or cleared by the [place] FDA and has been authorized for detection and/or diagnosis of SARS-CoV-2 by FDA under an Emergency Use Authorization (EUA). This EUA will remain in effect (meaning this test can be used) for the duration of the COVID-19 declaration under Section 564(b)(1) of the Act, 21 U.S.C. section 360bbb-3(b)(1), unless the authorization is terminated or revoked.  Performed at [facility], [address], [place], [place] [address]   C Difficile Quick Screen w PCR reflex     Status: None   Collection Time: May 06, 2022 11:00 AM   Specimen: STOOL  Result Value Ref Range Status   C Diff antigen NEGATIVE NEGATIVE Final   C Diff toxin NEGATIVE NEGATIVE Final   C Diff interpretation No C. difficile detected.  Final    Comment: Performed at [facility], [address] [place], [place] [address]  MRSA Next Gen by PCR, Nasal     Status: None   Collection Time: 05/12/2022  6:30 PM   Specimen: Nasal Mucosa; Nasal Swab  Result Value Ref Range Status   MRSA by PCR Next Gen NOT DETECTED NOT DETECTED Final    Comment: (NOTE) The GeneXpert MRSA Assay (FDA approved for NASAL specimens only), is one component of a comprehensive MRSA colonization surveillance program. It is not intended to diagnose MRSA infection nor to guide or monitor treatment for MRSA infections. Test performance is not FDA approved in patients less than 66 years old. Performed at [facility], [address], [place], [place] [address]      Marcos Eke,  NP [facility]  05/05/2022  11:57 AM

## 2022-05-05 NOTE — Progress Notes (Addendum)
Synchronized Cardioversion Performed x3 (120J, 150J, 200J) for Afib RVR with hemodynamic instability. Pt did NOT convert. Continue amio gtt.   Royston Bake, NP-S Simonne Martinet ACNP-BC [facility] Pulmonary/Critical Care Pager # [phone] OR # [phone] if no answer

## 2022-05-05 NOTE — Progress Notes (Signed)
Patient ETT was taped with cloth tape in preparation for pronation.    Patient proned and head turned to the right with 2 RT's, and 4 RN's with no complications. Suction catheter was able to be passed down the ETT with ease and bilateral breath sounds auscultated.

## 2022-05-05 NOTE — Progress Notes (Signed)
NAME:  Katelyn Stevenson, MRN:  [id number], DOB:  1956-02-29, LOS: 1 ADMISSION DATE:  04/24/2022, CONSULTATION DATE: 05/03/2022 REFERRING MD: Emergency department physician, CHIEF COMPLAINT: Acute respiratory distress  History of Present Illness:  66 year old smoker who quit approximately 4 weeks ago presents with 3 days of increasing shortness of breath, general malaise, fever and in the emergency room was found to be positive for influenza A.  She is in very obvious respiratory distress chest x-ray is consistent with ARDS  picture. Urgent intubation transferred to the intensive care unit.  Pertinent  Medical History   Past Medical History:  Diagnosis Date   Alcoholic hepatitis    Anxiety    Chronic back pain    Chronic neck pain    Chronic pain    Diabetes mellitus    Fibromyalgia    Hemochromatosis    Hepatic encephalopathy (HCC)    Hypertension    Thrombocytopenia ([place])    Thrombus DVT     Significant Hospital Events: Including procedures, antibiotic start and stop dates in addition to other pertinent events   12/13 admitted, intubated, proned overnight   Interim History / Subjective:  Requiring proning overnight, on bicarb gtt. Remains on amiodarone.  Objective   Blood pressure (!) 86/32, pulse (!) 107, temperature (!) 101.5 F (38.6 C), resp. rate (!) 31, height 5\' 6"  (1.676 m), weight 80 kg, SpO2 100 %.    Vent Mode: PRVC FiO2 (%):  [50 %-100 %] 100 % Set Rate:  [18 bmp-35 bmp] 35 bmp Vt Set:  [350 mL-470 mL] 470 mL PEEP:  [8 cmH20-12 cmH20] 12 cmH20 Plateau Pressure:  [18 cmH20-31 cmH20] 29 cmH20   Intake/Output Summary (Last 24 hours) at 05/05/2022 0703 Last data filed at 05/05/2022 0200 Gross per 24 hour  Intake 4858.26 ml  Output 40 ml  Net 4818.26 ml    Filed Weights   05/05/2022 0853 05/05/22 0500  Weight: 80 kg 80 kg    Examination: General: critically ill appearing woman lying in bed intubated, sedated, under NMB, proned. HENT: [place]/AT, limited by  prone positioning Lungs: rhales bilaterally, no significant ETT secretions. Plat 329, DP 15 on PEEP 14.  Cardiovascular: S1S2, tachycardic, irreg rhythm Abdomen: obese, soft Extremities: edema, mild area of cyanosis under an ice bag on posterior thigh Neuro: RASS -5, PERRL  ABG    Component Value Date/Time   PHART 7.237 (L) 05/05/2022 0459   PCO2ART 80.3 (HH) 05/05/2022 0459   PO2ART 179 (H) 05/05/2022 0459   HCO3 33.6 (H) 05/05/2022 0459   TCO2 36 (H) 05/05/2022 0459   ACIDBASEDEF 1.0 04/28/2022 2345   O2SAT 99 05/05/2022 0459   BUN 32 Cr 1.98 WBC 12.3 H/H 11.6/34.9 Platelets 133  Blood cultures> GPC 2/3 bottles (2/2 sites), BCID staph aureus  Resolved Hospital Problem list     Assessment & Plan:  Acute hypoxic & hyeprcapnic respiratory failure with ARDS due to flu A pneumonia and MSSA pneumonia Lifelong tobacco abuse  -LTVV-- 4-8cc/kg IBW with goal Pplat <30 and DP<15-- meeting both goals -prone positioning 16 h/day until P:F>150 -NMB + heavy sedation -High PEEP strategy; PEEP increased and FiO2 decreased based on overnight ABG. Permissive hypercapnia. -VAP prevention protocol -not a candidate for SAT & SBT yet -con't tamiflu and broad antibiotics-- would not deescalate vanc yet given severity of illness -goal net negative volume status-- lasix 60mg  and monitor for response -con't bicarb gtt for now to compensate for acidosis -empiric steroids  Septic shock due to Staph aureus pneumonia  and bacteremia -con't broad antibiotics -vasopressors PRN to maintain MAP >65 -can get TTE when supine; too unstable for TEE currently  AKI, likely ATN 2/2 sepsis hypervolemia -strict I/O -renally dose meds, avoid nephrotoxic meds -goal euvolemia; lasix today  History of polysubstance abuse -vitamins -sedation should prevent withdrawal  Afib with RVR -con't amiodarone -heparin gtt; was on xarelto PTA -tele monitoring  Hypocalcemia -repleted; follow up on next  ABG  Hyperglycemia, DM 2- A1c 6/9 -SSI PRN -goal BG 140-180  Acute anemia and thrombocytopenia due to critical illness, antibiotics -monitor -transfuse for Hb <7 or hemodynamically significant bleeding  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 -can resume xarelto when more stable  At risk for malnutrition -TF  Best Practice (right click and "Reselect all SmartList Selections" daily)   Diet/type: tubefeeds and NPO DVT prophylaxis: systemic heparin GI prophylaxis: PPI Lines: Central line and Arterial Line Foley:  Yes, and it is still needed Code Status:  full code Last date of multidisciplinary goals of care discussion [tbd]  Labs   CBC: Recent Labs  Lab 05/01/2022 0813 05/09/2022 1551 05/19/2022 2048 04/27/2022 2345 05/05/22 0226 05/05/22 0435 05/05/22 0459  WBC 8.4  --   --   --   --  12.3*  --   NEUTROABS 7.3  --   --   --   --   --   --   HGB 13.6   < > 12.2 12.9 12.2 11.6* 12.2  HCT 40.1   < > 36.0 38.0 36.0 34.9* 36.0  MCV 92.0  --   --   --   --  94.3  --   PLT 350  --   --   --   --  133*  --    < > = values in this interval not displayed.     Basic Metabolic Panel: Recent Labs  Lab 05/05/2022 0813 04/26/2022 1458 05/03/2022 1551 05/05/2022 2134 05/07/2022 2345 05/05/22 0226 05/05/22 0435 05/05/22 0459  NA 134*  --    < > 139 137 138 140 138  K 3.1*  --    < > 2.8* 3.0* 3.2* 3.7 3.6  CL 99  --   --  96*  --   --  97*  --   CO2 21*  --   --  30  --   --  30  --   GLUCOSE 128*  --   --  296*  --   --  174*  --   BUN 22  --   --  29*  --   --  32*  --   CREATININE 1.13* 1.23*  --  1.69*  --   --  1.98*  --   CALCIUM 8.1*  --   --  6.9*  --   --  7.3*  --   MG  --   --   --   --   --   --  1.8  --    < > = values in this interval not displayed.    GFR: Estimated Creatinine Clearance: 29.8 mL/min (A) (by C-G formula based on SCr of 1.98 mg/dL (H)). Recent Labs  Lab 04/28/2022 0813 04/23/2022 1141 05/05/22 0435  WBC 8.4  --  12.3*  LATICACIDVEN 5.2* 5.1*  --       Liver Function Tests: Recent Labs  Lab 04/24/2022 0813  AST 35  ALT 16  ALKPHOS 52  BILITOT 2.0*  PROT 6.8  ALBUMIN 1.8*  No results for input(s): "LIPASE", "AMYLASE" in the last 168 hours. No results for input(s): "AMMONIA" in the last 168 hours.  ABG    Component Value Date/Time   PHART 7.237 (L) 05/05/2022 0459   PCO2ART 80.3 (HH) 05/05/2022 0459   PO2ART 179 (H) 05/05/2022 0459   HCO3 33.6 (H) 05/05/2022 0459   TCO2 36 (H) 05/05/2022 0459   ACIDBASEDEF 1.0 05/22/2022 2345   O2SAT 99 05/05/2022 0459     This patient is critically ill with multiple organ system failure which requires frequent high complexity decision making, assessment, support, evaluation, and titration of therapies. This was completed through the application of advanced monitoring technologies and extensive interpretation of multiple databases. During this encounter critical care time was devoted to patient care services described in this note for 40 minutes.  Steffanie Dunn, DO 05/05/22 7:58 AM [facility]  For contact information, see Amion. If no response to pager, please call PCCM consult pager. After hours, 7PM- 7AM, please call Elink.

## 2022-05-05 NOTE — Progress Notes (Signed)
PHARMACY - PHYSICIAN COMMUNICATION CRITICAL VALUE ALERT - BLOOD CULTURE IDENTIFICATION (BCID)  Katelyn Stevenson is an 66 y.o. female who presented to [facility] on 05/11/2022 with a chief complaint of respiratory failure   Name of physician (or Provider) Contacted: Dr. Arsenio Loader  Current antibiotics: Vancomycin, Levaquin   Changes to prescribed antibiotics recommended:  No changes for now  Results for orders placed or performed during the hospital encounter of 05/21/2022  Blood Culture ID Panel (Reflexed) (Collected: 05/22/2022  8:13 AM)  Result Value Ref Range   Enterococcus faecalis NOT DETECTED NOT DETECTED   Enterococcus Faecium NOT DETECTED NOT DETECTED   Listeria monocytogenes NOT DETECTED NOT DETECTED   Staphylococcus species DETECTED (A) NOT DETECTED   Staphylococcus aureus (BCID) DETECTED (A) NOT DETECTED   Staphylococcus epidermidis NOT DETECTED NOT DETECTED   Staphylococcus lugdunensis NOT DETECTED NOT DETECTED   Streptococcus species NOT DETECTED NOT DETECTED   Streptococcus agalactiae NOT DETECTED NOT DETECTED   Streptococcus pneumoniae NOT DETECTED NOT DETECTED   Streptococcus pyogenes NOT DETECTED NOT DETECTED   A.calcoaceticus-baumannii NOT DETECTED NOT DETECTED   Bacteroides fragilis NOT DETECTED NOT DETECTED   Enterobacterales NOT DETECTED NOT DETECTED   Enterobacter cloacae complex NOT DETECTED NOT DETECTED   Escherichia coli NOT DETECTED NOT DETECTED   Klebsiella aerogenes NOT DETECTED NOT DETECTED   Klebsiella oxytoca NOT DETECTED NOT DETECTED   Klebsiella pneumoniae NOT DETECTED NOT DETECTED   Proteus species NOT DETECTED NOT DETECTED   Salmonella species NOT DETECTED NOT DETECTED   Serratia marcescens NOT DETECTED NOT DETECTED   Haemophilus influenzae NOT DETECTED NOT DETECTED   Neisseria meningitidis NOT DETECTED NOT DETECTED   Pseudomonas aeruginosa NOT DETECTED NOT DETECTED   Stenotrophomonas maltophilia NOT DETECTED NOT DETECTED   Candida albicans NOT  DETECTED NOT DETECTED   Candida auris NOT DETECTED NOT DETECTED   Candida glabrata NOT DETECTED NOT DETECTED   Candida krusei NOT DETECTED NOT DETECTED   Candida parapsilosis NOT DETECTED NOT DETECTED   Candida tropicalis NOT DETECTED NOT DETECTED   Cryptococcus neoformans/gattii NOT DETECTED NOT DETECTED   Meth resistant mecA/C and MREJ NOT DETECTED NOT DETECTED   Abran Duke, PharmD, BCPS Clinical Pharmacist Phone: [phone]

## 2022-05-05 NOTE — Progress Notes (Signed)
R hand and foot more cyanotic, absent distal pulses. Asymmetric cyanosis. Remains on heparin gtt. Due to current positioning, not able to get leg [place], but can get radial artery [place]. Anticipate history of smoking predisposes to PAD, plus vasopressors. With GPC bacteremia she is at risk for endocarditis, but can't get echo until supine. Treatment would remain supportive with heparin and AC. Not stable for any invasive interventions at this time.  Steffanie Dunn, DO 05/05/22 10:05 AM [facility]

## 2022-05-05 NOTE — Progress Notes (Signed)
Pharmacy Antibiotic Note  Katelyn Stevenson is a 66 y.o. female admitted on 05/19/22 with concern for PNA - found to have influenza and MSSA bacteremia. Pharmacy has been consulted for Cefazolin dosing.  Noted history of penicillin allergy of anaphylaxis/swelling at least 10 years ago with no other information (unable to clarify from patient) or documentation in the chart. No record of PCN/cephalosporin use.   Given unique side chain of Cefazolin - reasonable to trial given preferred therapy for MSSA. CCM team agreeable since patient already intubated and can be monitored for reaction.  Noted AKI with SCr rising and up to 1.98, will renally reduce.  Plan: - Start Cefazolin 2g IV every 12 hours - Monitor closely for allergic reaction - Will monitor renal fxn for necessary dose adjustments  Height: 5\' 6"  (167.6 cm) Weight: 80 kg (176 lb 5.9 oz) IBW/kg (Calculated) : 59.3  Temp (24hrs), Avg:100.8 F (38.2 C), Min:99.7 F (37.6 C), Max:102 F (38.9 C)  Recent Labs  Lab May 19, 2022 0813 2022-05-19 1141 05-19-2022 1458 May 19, 2022 2134 05/05/22 0435  WBC 8.4  --   --   --  12.3*  CREATININE 1.13*  --  1.23* 1.69* 1.98*  LATICACIDVEN 5.2* 5.1*  --   --   --     Estimated Creatinine Clearance: 29.8 mL/min (A) (by C-G formula based on SCr of 1.98 mg/dL (H)).    Allergies  Allergen Reactions   Penicillins Anaphylaxis and Swelling   Codeine Itching    Benadryl is given to combat symptoms    Antimicrobials this admission: Vancomycin 12/13 x 1 Levaquin 12/13 x 1 Tamiflu 12/14 >> Cefazolin 12/14 >>  Dose adjustments this admission: N/a  Microbiology results: 12/13 Influenza A positive 12/13 CDiff >> neg 12/13 MRSA PCR >> neg 12/13 BCx 2/4 >> MSSA  Thank you for allowing pharmacy to be a part of this patient's care.  1/14, PharmD, BCPS Infectious Diseases Clinical Pharmacist 05/05/2022 11:27 AM   **Pharmacist phone directory can now be found on [email or web address] ([email or web address]).   Listed under [facility].

## 2022-05-05 NOTE — Progress Notes (Signed)
Progress Note  Patient Name: Katelyn Stevenson Date of Encounter: 05/05/2022  Primary Cardiologist:   None   Subjective   Intubated and sedated  Inpatient Medications    Scheduled Meds:  artificial tears  1 Application Both Eyes [id number]   Chlorhexidine Gluconate Cloth  6 each Topical Q0600   docusate  100 mg Per Tube BID   feeding supplement (VITAL HIGH PROTEIN)  1,000 mL Per Tube Q24H   fentaNYL (SUBLIMAZE) injection  25 mcg Intravenous Once   free water  100 mL Per Tube Q6H   insulin aspart  0-15 Units Subcutaneous Q4H   insulin aspart  3-9 Units Subcutaneous Q4H   methylPREDNISolone (SOLU-MEDROL) injection  40 mg Intravenous Q12H   nystatin   Topical TID   mouth rinse  15 mL Mouth Rinse Q2H   oseltamivir  75 mg Per Tube BID   pantoprazole (PROTONIX) IV  40 mg Intravenous Q24H   polyethylene glycol  17 g Per Tube Daily   vancomycin variable dose per unstable renal function (pharmacist dosing)   Does not apply See admin instructions   Continuous Infusions:  amiodarone 30 mg/hr (05/05/22 0800)   fentaNYL infusion INTRAVENOUS 150 mcg/hr (05/05/22 0800)   heparin 1,100 Units/hr (05/05/22 0800)   [START ON 05/31/2022] levofloxacin (LEVAQUIN) IV     magnesium sulfate bolus IVPB 100 mL/hr at 05/05/22 0800   norepinephrine (LEVOPHED) Adult infusion 35 mcg/min (05/05/22 0800)   propofol (DIPRIVAN) infusion 25 mcg/kg/min (05/05/22 0800)   sodium bicarbonate 150 mEq in dextrose 5 % 1,150 mL infusion Stopped (05/05/22 0437)   vasopressin 0.03 Units/min (05/05/22 0800)   vecuronium (NORCURON) infusion 100mg /173mL (1 mg/mL) 0.4 mcg/kg/min (05/05/22 0800)   PRN Meds: acetaminophen, docusate, fentaNYL, ipratropium-albuterol, midazolam, mouth rinse, polyethylene glycol   Vital Signs    Vitals:   05/05/22 0745 05/05/22 0748 05/05/22 0800 05/05/22 0823  BP:      Pulse: (!) 105     Resp: (!) 35  (!) 35   Temp: (!) 100.9 F (38.3 C) (!) 100.9 F (38.3 C) (!) 100.9 F (38.3 C)    TempSrc:   Bladder   SpO2: 98%   100%  Weight:      Height:        Intake/Output Summary (Last 24 hours) at 05/05/2022 0834 Last data filed at 05/05/2022 0800 Gross per 24 hour  Intake 6578.99 ml  Output 85 ml  Net 6493.99 ml   Filed Weights   05/21/2022 0853 05/05/22 0500  Weight: 80 kg 80 kg    Telemetry    NSR  - Personally Reviewed  ECG    Posterior EKG without acute injury or ischemia pattern.   - Personally Reviewed  Physical Exam   GEN:    Critically ill appearing Neck: No  JVD Cardiac: RRR, (difficult exam with patient prone) Respiratory: Clear  to auscultation bilaterally. GI: Soft, nontender, non-distended  MS:     Right hand cyanotic and non palpable radial or ulnar pulse, right leg modeled with decreased pulses Neuro:  Nonfocal  Psych: Normal affect   Labs    Chemistry Recent Labs  Lab 05/03/2022 0813 05/02/2022 1458 05/09/2022 1551 05/13/2022 2134 05/10/2022 2345 05/05/22 0226 05/05/22 0435 05/05/22 0459  NA 134*  --    < > 139   < > 138 140 138  K 3.1*  --    < > 2.8*   < > 3.2* 3.7 3.6  CL 99  --   --  96*  --   --  97*  --   CO2 21*  --   --  30  --   --  30  --   GLUCOSE 128*  --   --  296*  --   --  174*  --   BUN 22  --   --  29*  --   --  32*  --   CREATININE 1.13* 1.23*  --  1.69*  --   --  1.98*  --   CALCIUM 8.1*  --   --  6.9*  --   --  7.3*  --   PROT 6.8  --   --   --   --   --   --   --   ALBUMIN 1.8*  --   --   --   --   --   --   --   AST 35  --   --   --   --   --   --   --   ALT 16  --   --   --   --   --   --   --   ALKPHOS 52  --   --   --   --   --   --   --   BILITOT 2.0*  --   --   --   --   --   --   --   GFRNONAA 54* 48*  --  33*  --   --  27*  --   ANIONGAP 14  --   --  13  --   --  13  --    < > = values in this interval not displayed.     Hematology Recent Labs  Lab 04/24/2022 0813 05/22/2022 1551 05/05/22 0226 05/05/22 0435 05/05/22 0459  WBC 8.4  --   --  12.3*  --   RBC 4.36  --   --  3.70*  --   HGB 13.6   <  > 12.2 11.6* 12.2  HCT 40.1   < > 36.0 34.9* 36.0  MCV 92.0  --   --  94.3  --   MCH 31.2  --   --  31.4  --   MCHC 33.9  --   --  33.2  --   RDW 13.9  --   --  14.5  --   PLT 350  --   --  133*  --    < > = values in this interval not displayed.    Cardiac EnzymesNo results for input(s): "TROPONINI" in the last 168 hours. No results for input(s): "TROPIPOC" in the last 168 hours.   BNPNo results for input(s): "BNP", "PROBNP" in the last 168 hours.   DDimer No results for input(s): "DDIMER" in the last 168 hours.   Radiology    DG Abd Portable 1V  Result Date: 04/29/2022 CLINICAL DATA:  Enteric catheter placement EXAM: PORTABLE ABDOMEN - 1 VIEW COMPARISON:  04/28/2022 FINDINGS: Frontal view of the lower chest and upper abdomen demonstrates enteric catheter passing below diaphragm, coiled within the stomach. The tip and side port project over the gastric antrum. Bowel gas pattern is unremarkable. Bibasilar airspace disease unchanged. IMPRESSION: 1. Enteric catheter coiled within the stomach, tip overlying the gastric antrum. Electronically Signed   By: Randa Ngo M.D.   On: 05/22/2022 18:40   ECHOCARDIOGRAM COMPLETE  Result Date: 05/20/2022    ECHOCARDIOGRAM REPORT   Patient Name:  Katelyn Stevenson Date of Exam: 05/05/2022 Medical Rec #:  [id number]      Height:       66.0 in Accession #:    [id number]     Weight:       176.4 lb Date of Birth:  24-Nov-1955      BSA:          1.896 m Patient Age:    66 years       BP:           79/55 mmHg Patient Gender: F              HR:           112 bpm. Exam Location:  Inpatient Procedure: 2D Echo, Cardiac Doppler and Color Doppler STAT ECHO Indications:    Acute respiratory distress  History:        Patient has no prior history of Echocardiogram examinations.                 Arrythmias:Atrial Fibrillation, Signs/Symptoms:Shortness of                 Breath; Risk Factors:Current Smoker. Hx DVT.  Sonographer:    Clayton Lefort RDCS (AE) Referring Phys:  [id number] Julian Hy  Sonographer Comments: Echo performed with patient supine and on artificial respirator. IMPRESSIONS  1. Left ventricular ejection fraction, by estimation, is 45 to 50%. The left ventricle has mildly decreased function. The left ventricle demonstrates global hypokinesis. Indeterminate diastolic filling due to E-A fusion.  2. Right ventricular systolic function is normal. The right ventricular size is normal. Tricuspid regurgitation signal is inadequate for assessing PA pressure.  3. The mitral valve is grossly normal. No evidence of mitral valve regurgitation. No evidence of mitral stenosis.  4. The aortic valve is grossly normal. Aortic valve regurgitation is not visualized. No aortic stenosis is present. FINDINGS  Left Ventricle: Left ventricular ejection fraction, by estimation, is 45 to 50%. The left ventricle has mildly decreased function. The left ventricle demonstrates global hypokinesis. The left ventricular internal cavity size was normal in size. There is  no left ventricular hypertrophy. Indeterminate diastolic filling due to E-A fusion. Right Ventricle: The right ventricular size is normal. No increase in right ventricular wall thickness. Right ventricular systolic function is normal. Tricuspid regurgitation signal is inadequate for assessing PA pressure. Left Atrium: Left atrial size was normal in size. Right Atrium: Right atrial size was normal in size. Pericardium: There is no evidence of pericardial effusion. Mitral Valve: The mitral valve is grossly normal. No evidence of mitral valve regurgitation. No evidence of mitral valve stenosis. MV peak gradient, 6.2 mmHg. The mean mitral valve gradient is 3.0 mmHg. Tricuspid Valve: The tricuspid valve is grossly normal. Tricuspid valve regurgitation is trivial. No evidence of tricuspid stenosis. Aortic Valve: The aortic valve is grossly normal. Aortic valve regurgitation is not visualized. No aortic stenosis is present. Aortic valve  mean gradient measures 5.0 mmHg. Aortic valve peak gradient measures 8.8 mmHg. Aortic valve area, by VTI measures 4.22 cm. Pulmonic Valve: The pulmonic valve was grossly normal. Pulmonic valve regurgitation is not visualized. No evidence of pulmonic stenosis. Aorta: The aortic root and ascending aorta are structurally normal, with no evidence of dilitation. Venous: IVC assessment for right atrial pressure unable to be performed due to mechanical ventilation. IAS/Shunts: The atrial septum is grossly normal.  LEFT VENTRICLE PLAX 2D LVIDd:         4.30 cm LVIDs:  3.20 cm LV PW:         1.00 cm LV IVS:        0.80 cm LVOT diam:     2.40 cm LV SV:         87 LV SV Index:   46 LVOT Area:     4.52 cm  RIGHT VENTRICLE             IVC RV Basal diam:  3.00 cm     IVC diam: 1.80 cm RV S prime:     14.10 cm/s TAPSE (M-mode): 2.0 cm LEFT ATRIUM           Index        RIGHT ATRIUM           Index LA diam:      2.90 cm 1.53 cm/m   RA Area:     13.70 cm LA Vol (A2C): 43.2 ml 22.79 ml/m  RA Volume:   34.60 ml  18.25 ml/m LA Vol (A4C): 29.7 ml 15.67 ml/m  AORTIC VALVE AV Area (Vmax):    4.34 cm AV Area (Vmean):   4.15 cm AV Area (VTI):     4.22 cm AV Vmax:           148.00 cm/s AV Vmean:          99.800 cm/s AV VTI:            0.206 m AV Peak Grad:      8.8 mmHg AV Mean Grad:      5.0 mmHg LVOT Vmax:         142.00 cm/s LVOT Vmean:        91.500 cm/s LVOT VTI:          0.192 m LVOT/AV VTI ratio: 0.93  AORTA Ao Root diam: 3.30 cm Ao Asc diam:  3.80 cm MITRAL VALVE MV Area VTI:  3.76 cm   SHUNTS MV Peak grad: 6.2 mmHg   Systemic VTI:  0.19 m MV Mean grad: 3.0 mmHg   Systemic Diam: 2.40 cm MV Vmax:      1.24 m/s MV Vmean:     82.4 cm/s Lennie Odor MD Electronically signed by Lennie Odor MD Signature Date/Time: 2022-06-01/5:24:42 PM    Final    DG Chest Portable 1 View  Result Date: 2022/06/01 CLINICAL DATA:  Intubated. EXAM: PORTABLE CHEST 1 VIEW COMPARISON:  Earlier film, same date. FINDINGS: The endotracheal  tube is 3.6 cm above the carina. Persistent diffuse interstitial and airspace process in the lungs. No pneumothorax or large pleural effusion. IMPRESSION: 1. The endotracheal tube is 3.6 cm above the carina. 2. Persistent diffuse interstitial and airspace process. Electronically Signed   By: Rudie Meyer M.D.   On: [place] 10, 2024 13:34   DG Chest Portable 1 View  Result Date: [place] 10, 2024 CLINICAL DATA:  sob EXAM: PORTABLE CHEST 1 VIEW COMPARISON:  Chest x-ray August 06, 2020. FINDINGS: Extensive patchy interstitial airspace opacities throughout both lungs. No visible pleural effusions or pneumothorax. Cardiomediastinal silhouette is within normal limits. Polyarticular degenerative change. IMPRESSION: Extensive patchy interstitial airspace opacities throughout both lungs, concerning for multifocal pneumonia. Followup PA and lateral chest X-ray is recommended in 3-4 weeks following trial of antibiotic therapy to ensure resolution and exclude underlying malignancy. Electronically Signed   By: Feliberto Harts M.D.   On: 06/01/22 08:37    Cardiac Studies   ECHO:  1. Left ventricular ejection fraction, by estimation, is 45 to 50%. The  left ventricle has  mildly decreased function. The left ventricle  demonstrates global hypokinesis. Indeterminate diastolic filling due to  E-A fusion.   2. Right ventricular systolic function is normal. The right ventricular  size is normal. Tricuspid regurgitation signal is inadequate for assessing  PA pressure.   3. The mitral valve is grossly normal. No evidence of mitral valve  regurgitation. No evidence of mitral stenosis.   4. The aortic valve is grossly normal. Aortic valve regurgitation is not  visualized. No aortic stenosis is present.   Patient Profile     66 y.o. female without prior cardiac history who presents with acute respiratory failure.  Atrial fib with rapid rate in the ED.    Assessment & Plan   Acute hypoxic respiratory failure:  ARDS with  flu A  Atrial fib:    Called urgently to the ED for atrial fib on admission.  Once patient was vented, sedated and placed on pressors and amiodarone bolus given and drip started she converted to NSR.  Continue amiodarone.  Was on DOAC for history of DVT.  Now on heparin.      AKI:   Oliguric with 4.8 liters positive.  Agree with continued attempts at diuresis.  Question ATN with shock.   Cardiomyopathy:   EF mildly reduced as above with global hypokinesis.  No recent prior echo.   Suspect non ischemic and related to sepsis or longstanding with substance abuse.  I will draw another troponin and  repeat an EKG for completeness.    (Note this was a posterior EKG.)     Cardiology will sign off.  Very poor prognosis with multiorgan system failure  For questions or updates, please contact [place] Please consult [email or web address] for contact info under Cardiology/STEMI.   Signed, Minus Breeding, MD  05/05/2022, 8:34 AM

## 2022-05-05 NOTE — Progress Notes (Addendum)
Dropping BP-- improved with 0.102mcg of epi push, starting epi gtt. Increase vasopressin to 0.04. No changes to vent.  Now BP dropping again but HR 180-- bolus amiodarone 150mg , con't gtt.    , DO 05/05/22 12:05 PM [facility]    Responded to amiodarone with improved BP on epi gtt. No won vaso 0.04, epi 40. Pplat 31 on vent, peak pressures improved now that head has been turned again. STAT ABG to reassess pH since bicarb gtt decreased. Pads placed and crash cart at bedside in case she develops unstable tachyarrhythmia again.   05/07/22, DO 05/05/22 12:28 PM [facility]   Notified by pharmacy that ancef started around the time BP dropped precipitously. Concern for anaphylaxis with history of PCN allergy. Stopping ancef now. Benadryl, pepcid, and higher dose steroids.  Additional cc time: 25 min.  05/07/22, DO 05/05/22 12:50 PM [facility]

## 2022-05-06 ENCOUNTER — Inpatient Hospital Stay (HOSPITAL_COMMUNITY): Payer: Medicare HMO

## 2022-05-06 DIAGNOSIS — Z515 Encounter for palliative care: Secondary | ICD-10-CM

## 2022-05-06 DIAGNOSIS — R2231 Localized swelling, mass and lump, right upper limb: Secondary | ICD-10-CM

## 2022-05-06 DIAGNOSIS — B9561 Methicillin susceptible Staphylococcus aureus infection as the cause of diseases classified elsewhere: Secondary | ICD-10-CM

## 2022-05-06 DIAGNOSIS — J189 Pneumonia, unspecified organism: Secondary | ICD-10-CM

## 2022-05-06 DIAGNOSIS — R7881 Bacteremia: Secondary | ICD-10-CM | POA: Diagnosis not present

## 2022-05-06 DIAGNOSIS — Z66 Do not resuscitate: Secondary | ICD-10-CM

## 2022-05-06 LAB — CBC
HCT: 36.2 % (ref 36.0–46.0)
Hemoglobin: 11.2 g/dL — ABNORMAL LOW (ref 12.0–15.0)
MCH: 31.1 pg (ref 26.0–34.0)
MCHC: 30.9 g/dL (ref 30.0–36.0)
MCV: 100.6 fL — ABNORMAL HIGH (ref 80.0–100.0)
Platelets: 105 10*3/uL — ABNORMAL LOW (ref 150–400)
RBC: 3.6 MIL/uL — ABNORMAL LOW (ref 3.87–5.11)
RDW: 15.3 % (ref 11.5–15.5)
WBC: 16.2 10*3/uL — ABNORMAL HIGH (ref 4.0–10.5)
nRBC: 4.1 % — ABNORMAL HIGH (ref 0.0–0.2)

## 2022-05-06 LAB — GLUCOSE, CAPILLARY
Glucose-Capillary: 177 mg/dL — ABNORMAL HIGH (ref 70–99)
Glucose-Capillary: 42 mg/dL — CL (ref 70–99)
Glucose-Capillary: 54 mg/dL — ABNORMAL LOW (ref 70–99)
Glucose-Capillary: 67 mg/dL — ABNORMAL LOW (ref 70–99)
Glucose-Capillary: 161 mg/dL — ABNORMAL HIGH (ref 70–99)
Glucose-Capillary: 84 mg/dL (ref 70–99)

## 2022-05-06 LAB — POCT I-STAT 7, (LYTES, BLD GAS, ICA,H+H)
Acid-base deficit: 5 mmol/L — ABNORMAL HIGH (ref 0.0–2.0)
Acid-base deficit: 8 mmol/L — ABNORMAL HIGH (ref 0.0–2.0)
Bicarbonate: 22.5 mmol/L (ref 20.0–28.0)
Bicarbonate: 25.6 mmol/L (ref 20.0–28.0)
Calcium, Ion: 0.9 mmol/L — ABNORMAL LOW (ref 1.15–1.40)
Calcium, Ion: 0.94 mmol/L — ABNORMAL LOW (ref 1.15–1.40)
HCT: 32 % — ABNORMAL LOW (ref 36.0–46.0)
HCT: 36 % (ref 36.0–46.0)
Hemoglobin: 10.9 g/dL — ABNORMAL LOW (ref 12.0–15.0)
Hemoglobin: 12.2 g/dL (ref 12.0–15.0)
O2 Saturation: 93 %
O2 Saturation: 98 %
Patient temperature: 37.8
Patient temperature: 38.2
Potassium: 4 mmol/L (ref 3.5–5.1)
Potassium: 4.1 mmol/L (ref 3.5–5.1)
Sodium: 135 mmol/L (ref 135–145)
Sodium: 135 mmol/L (ref 135–145)
TCO2: 25 mmol/L (ref 22–32)
TCO2: 28 mmol/L (ref 22–32)
pCO2 arterial: 77.4 mmHg (ref 32–48)
pCO2 arterial: 77.7 mmHg (ref 32–48)
pH, Arterial: 7.076 — CL (ref 7.35–7.45)
pH, Arterial: 7.133 — CL (ref 7.35–7.45)
pO2, Arterial: 160 mmHg — ABNORMAL HIGH (ref 83–108)
pO2, Arterial: 98 mmHg (ref 83–108)

## 2022-05-06 LAB — CULTURE, BLOOD (ROUTINE X 2)

## 2022-05-06 LAB — TRIGLYCERIDES: Triglycerides: 291 mg/dL — ABNORMAL HIGH (ref <150)

## 2022-05-06 LAB — PATHOLOGIST SMEAR REVIEW

## 2022-05-06 LAB — BASIC METABOLIC PANEL
Anion gap: 26 — ABNORMAL HIGH (ref 5–15)
BUN: 46 mg/dL — ABNORMAL HIGH (ref 8–23)
CO2: 21 mmol/L — ABNORMAL LOW (ref 22–32)
Calcium: 6.9 mg/dL — ABNORMAL LOW (ref 8.9–10.3)
Chloride: 91 mmol/L — ABNORMAL LOW (ref 98–111)
Creatinine, Ser: 2.77 mg/dL — ABNORMAL HIGH (ref 0.44–1.00)
GFR, Estimated: 18 mL/min — ABNORMAL LOW (ref >60)
Glucose, Bld: 72 mg/dL (ref 70–99)
Potassium: 4.5 mmol/L (ref 3.5–5.1)
Sodium: 138 mmol/L (ref 135–145)

## 2022-05-06 LAB — HEPARIN LEVEL (UNFRACTIONATED): Heparin Unfractionated: <0.1 IU/mL — ABNORMAL LOW (ref 0.30–0.70)

## 2022-05-06 MED ORDER — SODIUM BICARBONATE 8.4 % IV SOLN
INTRAVENOUS | Status: DC
  Filled 2022-05-06: qty 150

## 2022-05-06 MED ORDER — SODIUM CHLORIDE 0.9 % IV SOLN: INTRAVENOUS | Status: DC

## 2022-05-06 MED ORDER — PHENYLEPHRINE CONCENTRATED 100MG/250ML (0.4 MG/ML) INFUSION SIMPLE
0.0000 ug/min | INTRAVENOUS | Status: DC
  Administered 2022-05-06: 400 ug/min via INTRAVENOUS
  Administered 2022-05-06: 20 ug/min via INTRAVENOUS
  Filled 2022-05-06 (×3): qty 250

## 2022-05-06 MED ORDER — DIGOXIN 0.25 MG/ML IJ SOLN
0.2500 mg | Freq: Once | INTRAMUSCULAR | Status: AC
  Administered 2022-05-06: 0.25 mg via INTRAVENOUS
  Filled 2022-05-06: qty 2

## 2022-05-06 MED ORDER — DEXTROSE 50 % IV SOLN
INTRAVENOUS | Status: AC
  Filled 2022-05-06: qty 50

## 2022-05-06 MED ORDER — SODIUM BICARBONATE 8.4 % IV SOLN
50.0000 meq | Freq: Once | INTRAVENOUS | Status: AC
  Administered 2022-05-06: 50 meq via INTRAVENOUS
  Filled 2022-05-06: qty 50

## 2022-05-06 MED ORDER — CALCIUM GLUCONATE-NACL 2-0.675 GM/100ML-% IV SOLN
2.0000 g | Freq: Once | INTRAVENOUS | Status: AC
  Administered 2022-05-06: 2000 mg via INTRAVENOUS
  Filled 2022-05-06: qty 100

## 2022-05-06 MED ORDER — DEXTROSE 50 % IV SOLN
25.0000 g | INTRAVENOUS | Status: AC
  Administered 2022-05-06: 25 g via INTRAVENOUS

## 2022-05-06 MED ORDER — GLYCOPYRROLATE 0.2 MG/ML IJ SOLN: 0.2000 mg | INTRAMUSCULAR | Status: DC | PRN

## 2022-05-06 MED ORDER — FAMOTIDINE IN NACL 20-0.9 MG/50ML-% IV SOLN: 20.0000 mg | INTRAVENOUS | Status: DC

## 2022-05-06 MED ORDER — DEXTROSE 50 % IV SOLN: 50.0000 mL | Freq: Once | INTRAVENOUS | Status: AC

## 2022-05-06 MED ORDER — POLYVINYL ALCOHOL 1.4 % OP SOLN: 1.0000 [drp] | Freq: Four times a day (QID) | OPHTHALMIC | Status: DC | PRN

## 2022-05-06 MED ORDER — DEXTROSE 50 % IV SOLN
INTRAVENOUS | Status: AC
  Administered 2022-05-06: 50 mL via INTRAVENOUS
  Filled 2022-05-06: qty 50

## 2022-05-06 MED ORDER — GLYCOPYRROLATE 1 MG PO TABS: 1.0000 mg | ORAL_TABLET | ORAL | Status: DC | PRN

## 2022-05-06 MED ORDER — PHENYLEPHRINE HCL-NACL 20-0.9 MG/250ML-% IV SOLN: 0.0000 ug/min | INTRAVENOUS | Status: DC

## 2022-05-07 LAB — CULTURE, BLOOD (ROUTINE X 2): Special Requests: ADEQUATE

## 2022-05-07 LAB — HEMOGLOBIN A1C
Hgb A1c MFr Bld: 6 % — ABNORMAL HIGH (ref 4.8–5.6)
Mean Plasma Glucose: 126 mg/dL

## 2022-05-23 NOTE — Death Summary Note (Signed)
DEATH SUMMARY   Patient Details  Name: Katelyn Stevenson MRN: [id number] DOB: [birth year]  Admission/Discharge Information   Admit Date:  05-09-22  Date of Death: Date of Death: May 11, 2022  Time of Death: Time of Death: 07-04-13  Length of Stay: 2  Referring Physician: Stephens Shire, MD   Reason(s) for Hospitalization  67 year old smoker who quit approximately 4 weeks ago presents with 3 days of increasing shortness of breath, general malaise, fever and in the emergency room was found to be positive for influenza A.  She is in very obvious respiratory distress chest x-ray is consistent with ARDS  picture.  Diagnoses  Preliminary cause of death: Shock ([place]) Secondary Diagnoses (including complications and co-morbidities):  Principal Problem:   Respiratory failure ([place]) Active Problems:   Influenza A   Septic shock ([place])   MSSA bacteremia   Brief Hospital Course (including significant findings, care, treatment, and services provided and events leading to death)  67 year old smoker who quit approximately 4 weeks ago presents with 3 days of increasing shortness of breath, general malaise, fever and in the emergency room was found to be positive for influenza A.  She is in very obvious respiratory distress chest x-ray is consistent with ARDS  picture. Urgent intubation transferred to the intensive care unit.  2023-05-10 admitted, intubated, proned overnight  12/14 GPC bacteremia noted. Af w/ RVR did not respond to cardioversion. Amiodarone gtt started. Had progressive hypotension after ancef started. Felt likely anaphylaxis. Started on steroids, epi, H2B 2023-05-12 renal fxn worse. O2 needs not better. Refractory shock on 4 pressors. Night attending spoke w/ husband. Decided against CRRT  Acute hypoxemic respiratory failure requiring intubation mechanical ventilation, ARDS secondary to flu A, pneumonia, MSSA pneumonia History of prolonged tobacco use Septic shock related to Staph aureus pneumonia  and bacteremia AKI related to multiorgan failure Hypervolemia History of polysubstance use History of A-fib with RVR, Multiorgan failure secondary to above.  Due to progression of the patient's multiorgan failure.  We met with patient's family at bedside.  Patient's husband made the decision for transition to comfort care.  She was maxed on multiple vasopressors and still did not have a blood pressure that was sustainable with life.  Patient was transition to comfort care measures removed from mechanical support and passed peacefully in the ICU on comfort care.   Pertinent Labs and Studies  Significant Diagnostic Studies VAS [place] UPPER EXTREMITY ARTERIAL DUPLEX  Result Date: 05-11-22  UPPER EXTREMITY DUPLEX STUDY Patient Name:  Katelyn Stevenson  Date of Exam:   05/11/2022 Medical Rec #: [id number]       Accession #:    [id number] Date of Birth: [birth year]       Patient Gender: F Patient Age:   51 years Exam Location:  [facility] Procedure:      VAS [place] UPPER EXTREMITY ARTERIAL DUPLEX Referring Phys: Noemi Chapel --------------------------------------------------------------------------------  Indications: Cyanotic hand.  Risk Factors: Hypertension, Diabetes. Comparison Study: no prior Performing Technologist: Archie Patten RVS  Examination Guidelines: A complete evaluation includes B-mode imaging, spectral Doppler, color Doppler, and power Doppler as needed of all accessible portions of each vessel. Bilateral testing is considered an integral part of a complete examination. Limited examinations for reoccurring indications may be performed as noted.  Right Doppler Findings: +---------------+----------+---------+--------+--------+ Site           PSV (cm/s)Waveform StenosisComments +---------------+----------+---------+--------+--------+ Subclavian Prox91        triphasic                 +---------------+----------+---------+--------+--------+  Axillary       55        triphasic                  +---------------+----------+---------+--------+--------+ Brachial Prox  145       triphasic                 +---------------+----------+---------+--------+--------+ Brachial Mid   131       triphasic                 +---------------+----------+---------+--------+--------+ Brachial Dist  95        triphasic                 +---------------+----------+---------+--------+--------+ Radial Prox                       occluded         +---------------+----------+---------+--------+--------+ Radial Mid                        occluded         +---------------+----------+---------+--------+--------+ Radial Dist                       occluded         +---------------+----------+---------+--------+--------+ Ulnar Prox                        occluded         +---------------+----------+---------+--------+--------+    Summary:  Right: Obstruction noted in the radial artery and ulnar artery. *See table(s) above for measurements and observations. Electronically signed by Jamelle Haring on 2022-05-09 at 5:56:12 PM.    Final    DG Chest Port 1 View  Result Date: May 09, 2022 CLINICAL DATA:  Provided history: Respiratory failure. Respiratory distress/failure. EXAM: PORTABLE CHEST 1 VIEW COMPARISON:  Prior chest radiographs 05/10/2022 and earlier. FINDINGS: The ET tube terminates 4 cm above the level of the carina. An enteric tube passes below the level of the left hemidiaphragm and terminates outside of the field of view. Cardiac monitoring pads overlie the chest and upper abdomen. The cardiomediastinal silhouette is unchanged. Persistent bilateral interstitial and patchy airspace opacities which are diffuse, but greatest within the mid to upper lung zones. These findings are not significantly changed from the prior chest radiograph of 05/20/2022. No evidence of pleural effusion or pneumothorax. IMPRESSION: Support apparatus as described. Bilateral interstitial and patchy  airspace opacities, not significantly changed from the prior chest radiograph of 05/19/2022 and compatible with multifocal pneumonia in the appropriate clinical setting. Follow-up PA and lateral chest radiographs are recommended in 3-4 weeks following a trial of antibiotic therapy to ensure resolution, and to exclude underlying malignancy or alternative etiologies. Electronically Signed   By: Kellie Simmering D.O.   On: 05/09/22 08:12   DG Abd Portable 1V  Result Date: 05/10/2022 CLINICAL DATA:  Enteric catheter placement EXAM: PORTABLE ABDOMEN - 1 VIEW COMPARISON:  04/27/2022 FINDINGS: Frontal view of the lower chest and upper abdomen demonstrates enteric catheter passing below diaphragm, coiled within the stomach. The tip and side port project over the gastric antrum. Bowel gas pattern is unremarkable. Bibasilar airspace disease unchanged. IMPRESSION: 1. Enteric catheter coiled within the stomach, tip overlying the gastric antrum. Electronically Signed   By: Randa Ngo M.D.   On: 04/28/2022 18:40   ECHOCARDIOGRAM COMPLETE  Result Date: 04/28/2022    ECHOCARDIOGRAM REPORT   Patient Name:   Katelyn Stevenson  Date of Exam: 05/08/2022 Medical Rec #:  [id number]      Height:       66.0 in Accession #:    [id number]     Weight:       176.4 lb Date of Birth:  11/22/1955      BSA:          1.896 m Patient Age:    10 years       BP:           79/55 mmHg Patient Gender: F              HR:           112 bpm. Exam Location:  Inpatient Procedure: 2D Echo, Cardiac Doppler and Color Doppler STAT ECHO Indications:    Acute respiratory distress  History:        Patient has no prior history of Echocardiogram examinations.                 Arrythmias:Atrial Fibrillation, Signs/Symptoms:Shortness of                 Breath; Risk Factors:Current Smoker. Hx DVT.  Sonographer:    Clayton Lefort RDCS (AE) Referring Phys: [id number] Julian Hy  Sonographer Comments: Echo performed with patient supine and on artificial respirator.  IMPRESSIONS  1. Left ventricular ejection fraction, by estimation, is 45 to 50%. The left ventricle has mildly decreased function. The left ventricle demonstrates global hypokinesis. Indeterminate diastolic filling due to E-A fusion.  2. Right ventricular systolic function is normal. The right ventricular size is normal. Tricuspid regurgitation signal is inadequate for assessing PA pressure.  3. The mitral valve is grossly normal. No evidence of mitral valve regurgitation. No evidence of mitral stenosis.  4. The aortic valve is grossly normal. Aortic valve regurgitation is not visualized. No aortic stenosis is present. FINDINGS  Left Ventricle: Left ventricular ejection fraction, by estimation, is 45 to 50%. The left ventricle has mildly decreased function. The left ventricle demonstrates global hypokinesis. The left ventricular internal cavity size was normal in size. There is  no left ventricular hypertrophy. Indeterminate diastolic filling due to E-A fusion. Right Ventricle: The right ventricular size is normal. No increase in right ventricular wall thickness. Right ventricular systolic function is normal. Tricuspid regurgitation signal is inadequate for assessing PA pressure. Left Atrium: Left atrial size was normal in size. Right Atrium: Right atrial size was normal in size. Pericardium: There is no evidence of pericardial effusion. Mitral Valve: The mitral valve is grossly normal. No evidence of mitral valve regurgitation. No evidence of mitral valve stenosis. MV peak gradient, 6.2 mmHg. The mean mitral valve gradient is 3.0 mmHg. Tricuspid Valve: The tricuspid valve is grossly normal. Tricuspid valve regurgitation is trivial. No evidence of tricuspid stenosis. Aortic Valve: The aortic valve is grossly normal. Aortic valve regurgitation is not visualized. No aortic stenosis is present. Aortic valve mean gradient measures 5.0 mmHg. Aortic valve peak gradient measures 8.8 mmHg. Aortic valve area, by VTI measures  4.22 cm. Pulmonic Valve: The pulmonic valve was grossly normal. Pulmonic valve regurgitation is not visualized. No evidence of pulmonic stenosis. Aorta: The aortic root and ascending aorta are structurally normal, with no evidence of dilitation. Venous: IVC assessment for right atrial pressure unable to be performed due to mechanical ventilation. IAS/Shunts: The atrial septum is grossly normal.  LEFT VENTRICLE PLAX 2D LVIDd:         4.30 cm LVIDs:  3.20 cm LV PW:         1.00 cm LV IVS:        0.80 cm LVOT diam:     2.40 cm LV SV:         87 LV SV Index:   46 LVOT Area:     4.52 cm  RIGHT VENTRICLE             IVC RV Basal diam:  3.00 cm     IVC diam: 1.80 cm RV S prime:     14.10 cm/s TAPSE (M-mode): 2.0 cm LEFT ATRIUM           Index        RIGHT ATRIUM           Index LA diam:      2.90 cm 1.53 cm/m   RA Area:     13.70 cm LA Vol (A2C): 43.2 ml 22.79 ml/m  RA Volume:   34.60 ml  18.25 ml/m LA Vol (A4C): 29.7 ml 15.67 ml/m  AORTIC VALVE AV Area (Vmax):    4.34 cm AV Area (Vmean):   4.15 cm AV Area (VTI):     4.22 cm AV Vmax:           148.00 cm/s AV Vmean:          99.800 cm/s AV VTI:            0.206 m AV Peak Grad:      8.8 mmHg AV Mean Grad:      5.0 mmHg LVOT Vmax:         142.00 cm/s LVOT Vmean:        91.500 cm/s LVOT VTI:          0.192 m LVOT/AV VTI ratio: 0.93  AORTA Ao Root diam: 3.30 cm Ao Asc diam:  3.80 cm MITRAL VALVE MV Area VTI:  3.76 cm   SHUNTS MV Peak grad: 6.2 mmHg   Systemic VTI:  0.19 m MV Mean grad: 3.0 mmHg   Systemic Diam: 2.40 cm MV Vmax:      1.24 m/s MV Vmean:     82.4 cm/s Eleonore Chiquito MD Electronically signed by Eleonore Chiquito MD Signature Date/Time: 04/25/2022/5:24:42 PM    Final    DG Chest Portable 1 View  Result Date: 05/09/2022 CLINICAL DATA:  Intubated. EXAM: PORTABLE CHEST 1 VIEW COMPARISON:  Earlier film, same date. FINDINGS: The endotracheal tube is 3.6 cm above the carina. Persistent diffuse interstitial and airspace process in the lungs. No  pneumothorax or large pleural effusion. IMPRESSION: 1. The endotracheal tube is 3.6 cm above the carina. 2. Persistent diffuse interstitial and airspace process. Electronically Signed   By: Marijo Sanes M.D.   On: 05/03/2022 13:34   DG Chest Portable 1 View  Result Date: 05/07/2022 CLINICAL DATA:  sob EXAM: PORTABLE CHEST 1 VIEW COMPARISON:  Chest x-ray August 06, 2020. FINDINGS: Extensive patchy interstitial airspace opacities throughout both lungs. No visible pleural effusions or pneumothorax. Cardiomediastinal silhouette is within normal limits. Polyarticular degenerative change. IMPRESSION: Extensive patchy interstitial airspace opacities throughout both lungs, concerning for multifocal pneumonia. Followup PA and lateral chest X-ray is recommended in 3-4 weeks following trial of antibiotic therapy to ensure resolution and exclude underlying malignancy. Electronically Signed   By: Margaretha Sheffield M.D.   On: 05/21/2022 08:37    Microbiology Recent Results (from the past 240 hour(s))  Culture, blood (Routine x 2)     Status: Abnormal  Collection Time: 05/14/2022  8:13 AM   Specimen: BLOOD LEFT ARM  Result Value Ref Range Status   Specimen Description BLOOD LEFT ARM  Final   Special Requests   Final    BOTTLES DRAWN AEROBIC AND ANAEROBIC Blood Culture adequate volume   Culture  Setup Time   Final    GRAM POSITIVE COCCI IN CLUSTERS IN BOTH AEROBIC AND ANAEROBIC BOTTLES  PHARMD Lacassine 05/05/22 @ 0107 BY AB Performed at [facility], [place] [address] [place], [place] [address]    Culture STAPHYLOCOCCUS AUREUS (A)  Final   Report Status 05/07/2022 FINAL  Final   Organism ID, Bacteria STAPHYLOCOCCUS AUREUS  Final      Susceptibility   Staphylococcus aureus - MIC*    CIPROFLOXACIN >=8 RESISTANT Resistant     ERYTHROMYCIN <=0.25 SENSITIVE Sensitive     GENTAMICIN <=0.5 SENSITIVE Sensitive     OXACILLIN 0.5 SENSITIVE Sensitive     TETRACYCLINE <=1 SENSITIVE Sensitive     VANCOMYCIN  <=0.5 SENSITIVE Sensitive     TRIMETH/SULFA <=10 SENSITIVE Sensitive     CLINDAMYCIN <=0.25 SENSITIVE Sensitive     RIFAMPIN <=0.5 SENSITIVE Sensitive     Inducible Clindamycin NEGATIVE Sensitive     * STAPHYLOCOCCUS AUREUS  Blood Culture ID Panel (Reflexed)     Status: Abnormal   Collection Time: 05/12/2022  8:13 AM  Result Value Ref Range Status   Enterococcus faecalis NOT DETECTED NOT DETECTED Final   Enterococcus Faecium NOT DETECTED NOT DETECTED Final   Listeria monocytogenes NOT DETECTED NOT DETECTED Final   Staphylococcus species DETECTED (A) NOT DETECTED Final    Comment: CRITICAL RESULT CALLED TO, READ BACK BY AND VERIFIED WITH:  PHARMD LEDFORD 05/05/22 @ 0107 BY AB    Staphylococcus aureus (BCID) DETECTED (A) NOT DETECTED Final    Comment: CRITICAL RESULT CALLED TO, READ BACK BY AND VERIFIED WITH:  PHARMD LEDFORD 05/05/22 @ 0107 BY AB    Staphylococcus epidermidis NOT DETECTED NOT DETECTED Final   Staphylococcus lugdunensis NOT DETECTED NOT DETECTED Final   Streptococcus species NOT DETECTED NOT DETECTED Final   Streptococcus agalactiae NOT DETECTED NOT DETECTED Final   Streptococcus pneumoniae NOT DETECTED NOT DETECTED Final   Streptococcus pyogenes NOT DETECTED NOT DETECTED Final   A.calcoaceticus-baumannii NOT DETECTED NOT DETECTED Final   Bacteroides fragilis NOT DETECTED NOT DETECTED Final   Enterobacterales NOT DETECTED NOT DETECTED Final   Enterobacter cloacae complex NOT DETECTED NOT DETECTED Final   Escherichia coli NOT DETECTED NOT DETECTED Final   Klebsiella aerogenes NOT DETECTED NOT DETECTED Final   Klebsiella oxytoca NOT DETECTED NOT DETECTED Final   Klebsiella pneumoniae NOT DETECTED NOT DETECTED Final   Proteus species NOT DETECTED NOT DETECTED Final   Salmonella species NOT DETECTED NOT DETECTED Final   Serratia marcescens NOT DETECTED NOT DETECTED Final   Haemophilus influenzae NOT DETECTED NOT DETECTED Final   Neisseria meningitidis NOT DETECTED NOT  DETECTED Final   Pseudomonas aeruginosa NOT DETECTED NOT DETECTED Final   Stenotrophomonas maltophilia NOT DETECTED NOT DETECTED Final   Candida albicans NOT DETECTED NOT DETECTED Final   Candida auris NOT DETECTED NOT DETECTED Final   Candida glabrata NOT DETECTED NOT DETECTED Final   Candida krusei NOT DETECTED NOT DETECTED Final   Candida parapsilosis NOT DETECTED NOT DETECTED Final   Candida tropicalis NOT DETECTED NOT DETECTED Final   Cryptococcus neoformans/gattii NOT DETECTED NOT DETECTED Final   Meth resistant mecA/C and MREJ NOT DETECTED NOT DETECTED Final  Comment: Performed at [facility], [place] [address] [place], [place] [address]  Culture, blood (Routine x 2)     Status: Abnormal   Collection Time: 05/03/2022  8:18 AM   Specimen: BLOOD  Result Value Ref Range Status   Specimen Description BLOOD SITE NOT SPECIFIED  Final   Special Requests   Final    BOTTLES DRAWN AEROBIC AND ANAEROBIC Blood Culture results may not be optimal due to an inadequate volume of blood received in culture bottles   Culture  Setup Time   Final    GRAM POSITIVE COCCI IN CLUSTERS AEROBIC BOTTLE ONLY CRITICAL VALUE NOTED.  VALUE IS CONSISTENT WITH PREVIOUSLY REPORTED AND CALLED VALUE.    Culture (A)  Final    STAPHYLOCOCCUS AUREUS SUSCEPTIBILITIES PERFORMED ON PREVIOUS CULTURE WITHIN THE LAST 5 DAYS. Performed at [facility], [place] [address] [place], [place] [address]    Report Status May 17, 2022 FINAL  Final  Resp panel by RT-PCR (RSV, Flu A&B, Covid) Anterior Nasal Swab     Status: Abnormal   Collection Time: 05/05/2022  8:19 AM   Specimen: Anterior Nasal Swab  Result Value Ref Range Status   SARS Coronavirus 2 by RT PCR NEGATIVE NEGATIVE Final    Comment: (NOTE) SARS-CoV-2 target nucleic acids are NOT DETECTED.  The SARS-CoV-2 RNA is generally detectable in upper respiratory specimens during the acute phase of infection. The lowest concentration of SARS-CoV-2 viral copies this  assay can detect is 138 copies/mL. A negative result does not preclude SARS-Cov-2 infection and should not be used as the sole basis for treatment or other patient management decisions. A negative result may occur with  improper specimen collection/handling, submission of specimen other than nasopharyngeal swab, presence of viral mutation(s) within the areas targeted by this assay, and inadequate number of viral copies(<138 copies/mL). A negative result must be combined with clinical observations, patient history, and epidemiological information. The expected result is Negative.  Fact Sheet for Patients:  EntrepreneurPulse.com.au  Fact Sheet for Healthcare Providers:  IncredibleEmployment.be  This test is no t yet approved or cleared by the [place] FDA and  has been authorized for detection and/or diagnosis of SARS-CoV-2 by FDA under an Emergency Use Authorization (EUA). This EUA will remain  in effect (meaning this test can be used) for the duration of the COVID-19 declaration under Section 564(b)(1) of the Act, 21 U.S.C.section 360bbb-3(b)(1), unless the authorization is terminated  or revoked sooner.       Influenza A by PCR POSITIVE (A) NEGATIVE Final   Influenza B by PCR NEGATIVE NEGATIVE Final    Comment: (NOTE) The Xpert Xpress SARS-CoV-2/FLU/RSV plus assay is intended as an aid in the diagnosis of influenza from Nasopharyngeal swab specimens and should not be used as a sole basis for treatment. Nasal washings and aspirates are unacceptable for Xpert Xpress SARS-CoV-2/FLU/RSV testing.  Fact Sheet for Patients: EntrepreneurPulse.com.au  Fact Sheet for Healthcare Providers: IncredibleEmployment.be  This test is not yet approved or cleared by the [place] FDA and has been authorized for detection and/or diagnosis of SARS-CoV-2 by FDA under an Emergency Use Authorization (EUA). This EUA  will remain in effect (meaning this test can be used) for the duration of the COVID-19 declaration under Section 564(b)(1) of the Act, 21 U.S.C. section 360bbb-3(b)(1), unless the authorization is terminated or revoked.     Resp Syncytial Virus by PCR NEGATIVE NEGATIVE Final    Comment: (NOTE) Fact Sheet for Patients: EntrepreneurPulse.com.au  Fact Sheet for  Healthcare Providers: IncredibleEmployment.be  This test is not yet approved or cleared by the [place] and has been authorized for detection and/or diagnosis of SARS-CoV-2 by FDA under an Emergency Use Authorization (EUA). This EUA will remain in effect (meaning this test can be used) for the duration of the COVID-19 declaration under Section 564(b)(1) of the Act, 21 U.S.C. section 360bbb-3(b)(1), unless the authorization is terminated or revoked.  Performed at [facility], [place] [address], [place], [place] [address]   C Difficile Quick Screen w PCR reflex     Status: None   Collection Time: 05/05/2022 11:00 AM   Specimen: STOOL  Result Value Ref Range Status   C Diff antigen NEGATIVE NEGATIVE Final   C Diff toxin NEGATIVE NEGATIVE Final   C Diff interpretation No C. difficile detected.  Final    Comment: Performed at [facility], [place] [address] [place], [place] [address]  MRSA Next Gen by PCR, Nasal     Status: None   Collection Time: 04/26/2022  6:30 PM   Specimen: Nasal Mucosa; Nasal Swab  Result Value Ref Range Status   MRSA by PCR Next Gen NOT DETECTED NOT DETECTED Final    Comment: (NOTE) The GeneXpert MRSA Assay (FDA approved for NASAL specimens only), is one component of a comprehensive MRSA colonization surveillance program. It is not intended to diagnose MRSA infection nor to guide or monitor treatment for MRSA infections. Test performance is not FDA approved in patients less than 57 years old. Performed at [facility], [place] [address]  [place], [place] [address]   Culture, blood (Routine X 2) w Reflex to ID Panel     Status: Abnormal   Collection Time: 05/05/22  7:56 AM   Specimen: BLOOD RIGHT WRIST  Result Value Ref Range Status   Specimen Description BLOOD RIGHT WRIST  Final   Special Requests   Final    BOTTLES DRAWN AEROBIC ONLY Blood Culture results may not be optimal due to an inadequate volume of blood received in culture bottles   Culture  Setup Time   Final    GRAM POSITIVE COCCI IN CLUSTERS AEROBIC BOTTLE ONLY CRITICAL VALUE NOTED.  VALUE IS CONSISTENT WITH PREVIOUSLY REPORTED AND CALLED VALUE.    Culture (A)  Final    STAPHYLOCOCCUS AUREUS SUSCEPTIBILITIES PERFORMED ON PREVIOUS CULTURE WITHIN THE LAST 5 DAYS. Performed at [facility], [place] [address], [place], [place] [address]    Report Status 05/07/2022 FINAL  Final    Lab Basic Metabolic Panel: No results for input(s): "NA", "K", "CL", "CO2", "GLUCOSE", "BUN", "CREATININE", "CALCIUM", "MG", "PHOS" in the last 168 hours. Liver Function Tests: No results for input(s): "AST", "ALT", "ALKPHOS", "BILITOT", "PROT", "ALBUMIN" in the last 168 hours. No results for input(s): "LIPASE", "AMYLASE" in the last 168 hours. No results for input(s): "AMMONIA" in the last 168 hours. CBC: No results for input(s): "WBC", "NEUTROABS", "HGB", "HCT", "MCV", "PLT" in the last 168 hours. Cardiac Enzymes: No results for input(s): "CKTOTAL", "CKMB", "CKMBINDEX", "TROPONINI" in the last 168 hours. Sepsis Labs: No results for input(s): "PROCALCITON", "WBC", "LATICACIDVEN" in the last 168 hours.  Procedures/Operations  ETT  CVL   Maleigha Colvard L Brion Hedges 05/13/2022, 3:14 PM

## 2022-05-23 NOTE — Progress Notes (Signed)
Nutrition Brief Note ° °Chart reviewed. °Pt now transitioning to comfort care.  °No further nutrition interventions planned at this time.  °Please re-consult as needed. ° ° °Kate Asani Mcburney, MS, RD, LDN °Inpatient Clinical Dietitian °Please see AMiON for contact information. ° ° °

## 2022-05-23 NOTE — IPAL (Signed)
  Interdisciplinary Goals of Care Family Meeting   Date carried out: 05/22/2022  Location of the meeting: Bedside  Member's involved: Physician, Nurse Practitioner, Bedside Registered Nurse, and Family Member or next of kin  Durable Power of Attorney or acting medical decision maker: husband [name]    Discussion: We discussed goals of care for Katelyn Stevenson .   She is in MODS w/ refractory hypoxia on high peep, FIO2 and 4 vasoactive gtts. He has progressed to renal failure and continues to have refractory acidosis. There is nothing more we can offer. She will not survive.   Code status:   Code Status: DNR   Disposition: In-patient comfort care  Time spent for the meeting: 23 min     [name], NP  05/10/2022, 10:03 AM

## 2022-05-23 NOTE — Progress Notes (Signed)
     Referral received for Katelyn Stevenson re: goals of care discussion. Chart reviewed and updates received from RN Katelyn Stevenson and NP Katelyn Stevenson. As per these updates, pt is actively dying and husband has decided to proceed with comfort measures once other family has arrived to say their goodbyes.   As per my discussion with NP Katelyn Stevenson, there is nothing more for PMT to add/offer at this time.  On assessment, patient's husband is not in a state to receive PMT support at this time.   PMT will remain available to patient/family and medical team. Please contact PMT via amion providers if palliative needs arise or if we can assist with comfort care measures during hospitalization.   Thank you for your referral and allowing PMT to assist in Mrs. Katelyn Stevenson's care.   Georgiann Cocker, FNP-BC Palliative Medicine Team  Phone: [phone]  NO CHARGE

## 2022-05-23 NOTE — Progress Notes (Addendum)
eLink Physician-Brief Progress Note Patient Name: Katelyn Stevenson DOB: 1956-04-30 MRN: [id number]   Date of Service  May 24, 2022  HPI/Events of Note  ABG reviewed, limited options for reversing oxygenation / ventilation deficits, PH 7.13, will order one amp of Bicarb and repeat gas in one hour. Patient is due to be re-proned at 4 am. Ionized calcium is 0.90.  eICU Interventions  One amp of Bicarb ordered. Calcium gluconate 2 gm iv x 1 ordered.        Katelyn Stevenson Katelyn Stevenson 05/24/22, 1:45 AM

## 2022-05-23 NOTE — Progress Notes (Signed)
[facility]   Reason for visit: Follow up on bacteremia  Interval History: Tmax 101.7, WBC up to 16.2.  she is currently on support with multiple medications including pressor support and amiodarone.   Day 3 antibiotics  Physical Exam: Constitutional:  Vitals:   05/15/2022 0800 05/22/2022 0815  BP:  (!) 119/45  Pulse:  (!) 121  Resp: (!) 28 (!) 35  Temp: 99 F (37.2 C) 98.8 F (37.1 C)  SpO2:  100%  Intubated and sedated Eyes: anicteric HENT: +ET Respiratory:respiratory effort on vent  Review of Systems: Unable to be assessed due to patient factors  Lab Results  Component Value Date   WBC 16.2 (H) 04/23/2022   HGB 10.9 (L) 04/24/2022   HCT 32.0 (L) 05/11/2022   MCV 100.6 (H) 04/28/2022   PLT 105 (L) 05/11/2022    Lab Results  Component Value Date   CREATININE 2.77 (H) 05/05/2022   BUN 46 (H) 05/13/2022   NA 135 05/14/2022   K 4.0 05/20/2022   CL 91 (L) 05/01/2022   CO2 21 (L) 05/14/2022    Lab Results  Component Value Date   ALT 16 05-24-22   AST 35 05/24/22   ALKPHOS 52 2022-05-24     Microbiology: Recent Results (from the past 240 hour(s))  Culture, blood (Routine x 2)     Status: Abnormal (Preliminary result)   Collection Time: 05/24/22  8:13 AM   Specimen: BLOOD LEFT ARM  Result Value Ref Range Status   Specimen Description BLOOD LEFT ARM  Final   Special Requests   Final    BOTTLES DRAWN AEROBIC AND ANAEROBIC Blood Culture adequate volume   Culture  Setup Time   Final    GRAM POSITIVE COCCI IN CLUSTERS IN BOTH AEROBIC AND ANAEROBIC BOTTLES  PHARMD LEDFORD 05/05/22 @ 0107 BY AB Performed at [facility], [address] [place], [place] [address]    Culture STAPHYLOCOCCUS AUREUS (A)  Final   Report Status PENDING  Incomplete   Organism ID, Bacteria STAPHYLOCOCCUS AUREUS  Final      Susceptibility   Staphylococcus aureus - MIC*    CIPROFLOXACIN >=8 RESISTANT Resistant     ERYTHROMYCIN <=0.25 SENSITIVE  Sensitive     GENTAMICIN <=0.5 SENSITIVE Sensitive     OXACILLIN 0.5 SENSITIVE Sensitive     TETRACYCLINE <=1 SENSITIVE Sensitive     VANCOMYCIN <=0.5 SENSITIVE Sensitive     TRIMETH/SULFA <=10 SENSITIVE Sensitive     CLINDAMYCIN <=0.25 SENSITIVE Sensitive     RIFAMPIN <=0.5 SENSITIVE Sensitive     Inducible Clindamycin NEGATIVE Sensitive     * STAPHYLOCOCCUS AUREUS  Blood Culture ID Panel (Reflexed)     Status: Abnormal   Collection Time: 05/24/2022  8:13 AM  Result Value Ref Range Status   Enterococcus faecalis NOT DETECTED NOT DETECTED Final   Enterococcus Faecium NOT DETECTED NOT DETECTED Final   Listeria monocytogenes NOT DETECTED NOT DETECTED Final   Staphylococcus species DETECTED (A) NOT DETECTED Final    Comment: CRITICAL RESULT CALLED TO, READ BACK BY AND VERIFIED WITH:  PHARMD LEDFORD 05/05/22 @ 0107 BY AB    Staphylococcus aureus (BCID) DETECTED (A) NOT DETECTED Final    Comment: CRITICAL RESULT CALLED TO, READ BACK BY AND VERIFIED WITH:  PHARMD LEDFORD 05/05/22 @ 0107 BY AB    Staphylococcus epidermidis NOT DETECTED NOT DETECTED Final   Staphylococcus lugdunensis NOT DETECTED NOT DETECTED Final   Streptococcus species NOT DETECTED NOT DETECTED  Final   Streptococcus agalactiae NOT DETECTED NOT DETECTED Final   Streptococcus pneumoniae NOT DETECTED NOT DETECTED Final   Streptococcus pyogenes NOT DETECTED NOT DETECTED Final   A.calcoaceticus-baumannii NOT DETECTED NOT DETECTED Final   Bacteroides fragilis NOT DETECTED NOT DETECTED Final   Enterobacterales NOT DETECTED NOT DETECTED Final   Enterobacter cloacae complex NOT DETECTED NOT DETECTED Final   Escherichia coli NOT DETECTED NOT DETECTED Final   Klebsiella aerogenes NOT DETECTED NOT DETECTED Final   Klebsiella oxytoca NOT DETECTED NOT DETECTED Final   Klebsiella pneumoniae NOT DETECTED NOT DETECTED Final   Proteus species NOT DETECTED NOT DETECTED Final   Salmonella species NOT DETECTED NOT DETECTED Final    Serratia marcescens NOT DETECTED NOT DETECTED Final   Haemophilus influenzae NOT DETECTED NOT DETECTED Final   Neisseria meningitidis NOT DETECTED NOT DETECTED Final   Pseudomonas aeruginosa NOT DETECTED NOT DETECTED Final   Stenotrophomonas maltophilia NOT DETECTED NOT DETECTED Final   Candida albicans NOT DETECTED NOT DETECTED Final   Candida auris NOT DETECTED NOT DETECTED Final   Candida glabrata NOT DETECTED NOT DETECTED Final   Candida krusei NOT DETECTED NOT DETECTED Final   Candida parapsilosis NOT DETECTED NOT DETECTED Final   Candida tropicalis NOT DETECTED NOT DETECTED Final   Cryptococcus neoformans/gattii NOT DETECTED NOT DETECTED Final   Meth resistant mecA/C and MREJ NOT DETECTED NOT DETECTED Final    Comment: Performed at [facility], [address], [place], [place] [address]  Culture, blood (Routine x 2)     Status: Abnormal   Collection Time: 05/18/2022  8:18 AM   Specimen: BLOOD  Result Value Ref Range Status   Specimen Description BLOOD SITE NOT SPECIFIED  Final   Special Requests   Final    BOTTLES DRAWN AEROBIC AND ANAEROBIC Blood Culture results may not be optimal due to an inadequate volume of blood received in culture bottles   Culture  Setup Time   Final    GRAM POSITIVE COCCI IN CLUSTERS AEROBIC BOTTLE ONLY CRITICAL VALUE NOTED.  VALUE IS CONSISTENT WITH PREVIOUSLY REPORTED AND CALLED VALUE.    Culture (A)  Final    STAPHYLOCOCCUS AUREUS SUSCEPTIBILITIES PERFORMED ON PREVIOUS CULTURE WITHIN THE LAST 5 DAYS. Performed at [facility], [address], [place], [place] [address]    Report Status May 07, 2022 FINAL  Final  Resp panel by RT-PCR (RSV, Flu A&B, Covid) Anterior Nasal Swab     Status: Abnormal   Collection Time: 04/30/2022  8:19 AM   Specimen: Anterior Nasal Swab  Result Value Ref Range Status   SARS Coronavirus 2 by RT PCR NEGATIVE NEGATIVE Final    Comment: (NOTE) SARS-CoV-2 target nucleic acids are NOT DETECTED.  The SARS-CoV-2 RNA  is generally detectable in upper respiratory specimens during the acute phase of infection. The lowest concentration of SARS-CoV-2 viral copies this assay can detect is 138 copies/mL. A negative result does not preclude SARS-Cov-2 infection and should not be used as the sole basis for treatment or other patient management decisions. A negative result may occur with  improper specimen collection/handling, submission of specimen other than nasopharyngeal swab, presence of viral mutation(s) within the areas targeted by this assay, and inadequate number of viral copies(<138 copies/mL). A negative result must be combined with clinical observations, patient history, and epidemiological information. The expected result is Negative.  Fact Sheet for Patients:  BloggerCourse.com  Fact Sheet for Healthcare Providers:  SeriousBroker.it  This test is no t yet approved or  cleared by the [place] FDA and  has been authorized for detection and/or diagnosis of SARS-CoV-2 by FDA under an Emergency Use Authorization (EUA). This EUA will remain  in effect (meaning this test can be used) for the duration of the COVID-19 declaration under Section 564(b)(1) of the Act, 21 U.S.C.section 360bbb-3(b)(1), unless the authorization is terminated  or revoked sooner.       Influenza A by PCR POSITIVE (A) NEGATIVE Final   Influenza B by PCR NEGATIVE NEGATIVE Final    Comment: (NOTE) The Xpert Xpress SARS-CoV-2/FLU/RSV plus assay is intended as an aid in the diagnosis of influenza from Nasopharyngeal swab specimens and should not be used as a sole basis for treatment. Nasal washings and aspirates are unacceptable for Xpert Xpress SARS-CoV-2/FLU/RSV testing.  Fact Sheet for Patients: BloggerCourse.comhttps://www.fda.gov/media/152166/download  Fact Sheet for Healthcare Providers: SeriousBroker.ithttps://www.fda.gov/media/152162/download  This test is not yet approved or cleared by the  [place] FDA and has been authorized for detection and/or diagnosis of SARS-CoV-2 by FDA under an Emergency Use Authorization (EUA). This EUA will remain in effect (meaning this test can be used) for the duration of the COVID-19 declaration under Section 564(b)(1) of the Act, 21 U.S.C. section 360bbb-3(b)(1), unless the authorization is terminated or revoked.     Resp Syncytial Virus by PCR NEGATIVE NEGATIVE Final    Comment: (NOTE) Fact Sheet for Patients: BloggerCourse.comhttps://www.fda.gov/media/152166/download  Fact Sheet for Healthcare Providers: SeriousBroker.ithttps://www.fda.gov/media/152162/download  This test is not yet approved or cleared by the [place] FDA and has been authorized for detection and/or diagnosis of SARS-CoV-2 by FDA under an Emergency Use Authorization (EUA). This EUA will remain in effect (meaning this test can be used) for the duration of the COVID-19 declaration under Section 564(b)(1) of the Act, 21 U.S.C. section 360bbb-3(b)(1), unless the authorization is terminated or revoked.  Performed at [facility], [address] [place], [place] [address]   C Difficile Quick Screen w PCR reflex     Status: None   Collection Time: January 09, 2022 11:00 AM   Specimen: STOOL  Result Value Ref Range Status   C Diff antigen NEGATIVE NEGATIVE Final   C Diff toxin NEGATIVE NEGATIVE Final   C Diff interpretation No C. difficile detected.  Final    Comment: Performed at [facility], [address] [place], [place] [address]  MRSA Next Gen by PCR, Nasal     Status: None   Collection Time: January 09, 2022  6:30 PM   Specimen: Nasal Mucosa; Nasal Swab  Result Value Ref Range Status   MRSA by PCR Next Gen NOT DETECTED NOT DETECTED Final    Comment: (NOTE) The GeneXpert MRSA Assay (FDA approved for NASAL specimens only), is one component of a comprehensive MRSA colonization surveillance program. It is not intended to diagnose MRSA infection nor to guide or monitor treatment for  MRSA infections. Test performance is not FDA approved in patients less than 67 years old. Performed at [facility], [address] [place], [place] [address]   Culture, blood (Routine X 2) w Reflex to ID Panel     Status: None (Preliminary result)   Collection Time: 05/05/22  7:56 AM   Specimen: BLOOD RIGHT WRIST  Result Value Ref Range Status   Specimen Description BLOOD RIGHT WRIST  Final   Special Requests   Final    BOTTLES DRAWN AEROBIC ONLY Blood Culture results may not be optimal due to an inadequate volume of blood received in culture bottles  Culture  Setup Time   Final    GRAM POSITIVE COCCI IN CLUSTERS AEROBIC BOTTLE ONLY CRITICAL VALUE NOTED.  VALUE IS CONSISTENT WITH PREVIOUSLY REPORTED AND CALLED VALUE.    Culture   Final    NO GROWTH < 24 HOURS Performed at [facility], [address] [place], [place] [address]    Report Status PENDING  Incomplete    Impression/Plan:  1. MSSA bacteremia - on vancomycin with concern for allergic reaction to cefazolin and will continue this.  Repeat blood culture again positive with GPC again.   Will continue with vancomycin  2.  Influenza - with pneumonia which may be flu related vs MSSA or both.   Continue with Tamiflu  3.  Acute hypoxic and hypercapneic respiratory failure with ARDS - in the setting of influenza A and MSSA.  High FiO2 needs.  Managed by CCM.  Will continue to monitor.

## 2022-05-23 NOTE — TOC Progression Note (Addendum)
Transition of Care [facility]) - Progression Note    Patient Details  Name: Katelyn Stevenson MRN: [id number] Date of Birth: 1956/04/15  Transition of Care [facility]) CM/SW Contact  Tom-Johnson, Hershal Coria, RN Phone Number: 05/10/22, 1:21 PM  Clinical Narrative:     Patient is transitioned to Comfort care at this time. Husband is at bedside and other family members to arrive. TOC continues to follow and available to support with comfort measures.        Expected Discharge Plan and Services                                                 Social Determinants of Health (SDOH) Interventions    Readmission Risk Interventions     No data to display

## 2022-05-23 NOTE — Progress Notes (Signed)
Upper extremity arterial duplex has been completed.   Results given to RN.  Preliminary results in CV Proc.   Katelyn Stevenson 04/29/2022 9:42 AM

## 2022-05-23 NOTE — Progress Notes (Signed)
eLink Physician-Brief Progress Note Patient Name: Katelyn Stevenson DOB: 05/13/1956 MRN: [id number]   Date of Service  05-23-2022  HPI/Events of Note  Afib with RVR, heart rate 150's, patient has soft blood pressures, on Norepinephrine, Epinephrine and Vasopressin gtt, no urine output despite lasix gtt.   eICU Interventions  Phenylephrine gtt added, Digoxin 0.25 mg iv x 1 ordered, titrate Epinephrine gtt up if BP remains low despite Phenylephrine gtt added,        Katelyn Stevenson 05-23-2022, 3:08 AM

## 2022-05-23 NOTE — Progress Notes (Signed)
Patient passed with husband and husbands brother at bedside, clothes sent home with husband.

## 2022-05-23 NOTE — Progress Notes (Signed)
This chaplain responded to the unit referral for EOL spiritual care. The Pt. husband-[name] is at the bedside.   [name] accepts the chaplain invitation for story telling in the setting of his anticipatory grief. The chaplain understands Gabriel Rung has been married to the Pt. for forty+ years, during this time "the Pt. has always had his back."  [name] tightly holds the Pt. hand tightly and shares he doesn't want the Pt. to suffer. The chaplain understands Gabriel Rung is expecting a hospital visit from his brother in about 2 hours. [name] is comfortable with the support the brother will provide.   [name] accepted F/U spiritual care and prayer from the chaplain.   Chaplain Stephanie Acre [phone]

## 2022-05-23 NOTE — Progress Notes (Signed)
NAME:  Katelyn Stevenson, MRN:  [id number], DOB:  10/01/1955, LOS: 2 ADMISSION DATE:  05-19-22, CONSULTATION DATE: 2022/05/19 REFERRING MD: Emergency department physician, CHIEF COMPLAINT: Acute respiratory distress  History of Present Illness:  67 year old smoker who quit approximately 4 weeks ago presents with 3 days of increasing shortness of breath, general malaise, fever and in the emergency room was found to be positive for influenza A.  She is in very obvious respiratory distress chest x-ray is consistent with ARDS  picture. Urgent intubation transferred to the intensive care unit.  Pertinent  Medical History   Past Medical History:  Diagnosis Date   Alcoholic hepatitis    Anxiety    Chronic back pain    Chronic neck pain    Chronic pain    Diabetes mellitus    Fibromyalgia    Hemochromatosis    Hepatic encephalopathy (HCC)    Hypertension    Thrombocytopenia (HCC)    Thrombus DVT     Significant Hospital Events: Including procedures, antibiotic start and stop dates in addition to other pertinent events   12/13 admitted, intubated, proned overnight  12/14 GPC bacteremia noted. Af w/ RVR did not respond to cardioversion. Amiodarone gtt started. Had progressive hypotension after ancef started. Felt likely anaphylaxis. Started on steroids, epi, H2B 12/15 renal fxn worse. O2 needs not better. Refractory shock on 4 pressors. Night attending spoke w/ husband. Decided against CRRT  Interim History / Subjective:  Clinically worse  Objective   Blood pressure (Abnormal) 119/45, pulse (Abnormal) 121, temperature 98.8 F (37.1 C), resp. rate (Abnormal) 35, height 5\' 6"  (1.676 m), weight 102.7 kg, SpO2 100 %.    Vent Mode: PRVC FiO2 (%):  [70 %-100 %] 100 % Set Rate:  [35 bmp] 35 bmp Vt Set:  [470 mL] 470 mL PEEP:  [14 cmH20] 14 cmH20 Plateau Pressure:  [29 cmH20-31 cmH20] 31 cmH20   Intake/Output Summary (Last 24 hours) at 05/11/2022 0818 Last data filed at 05/21/2022  0600 Gross per 24 hour  Intake 5713.03 ml  Output 153 ml  Net 5560.03 ml   Filed Weights   05/05/22 0500 05/05/22 1400 04/30/2022 0500  Weight: 80 kg 99 kg 102.7 kg    Examination:  General sedated and on NMB infusion  HENT NCAT no JVD MMM orally intubated Pulm dec bases. Still on high PEEP/FIO2 PCXR personally reviewed. Looks worse Card reg irreg af w/ RVR Abd soft Ext cool mottled weak pulses  Resolved Hospital Problem list     Assessment & Plan:  Acute hypoxic & hyeprcapnic respiratory failure with ARDS due to flu A pneumonia and MSSA pneumonia Lifelong tobacco abuse  -still high fio2 needs.  Plan Cont LTVV  Pplat goal < 30  Driving pressure goal < 15 Cont NMB and heavy sedation  VAP bundle Lasix gtt Day 3 vanc (had concern about anaphylaxis w/ ancef) and tamiflu    Septic shock due to Staph aureus pneumonia and bacteremia C/b possible anaphylaxis w/ cephalosporin 12/14 Plan Cont broad spec abx (ID following)  Cont high dose Vasopressors (currently on NE, vasopressin, neo and Epi) con't broad antibiotics Bicarb gtt to keep ph > 7.2 Stress dose steroids Cont H2b  Cont epi  AKI, likely ATN 2/2 sepsis Hypervolemia Scr worse. Not candidate for iHD, I do not think candidate for CRRT either. 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

## 2022-05-23 NOTE — Progress Notes (Signed)
ANTICOAGULATION CONSULT NOTE   Pharmacy Consult for heparin Indication: arterial occlusion  Allergies  Allergen Reactions   Cefazolin Anaphylaxis    Anaphylactic shock   Penicillins Anaphylaxis and Swelling   Codeine Itching    Benadryl is given to combat symptoms    Patient Measurements: Height: 5\' 6"  (167.6 cm) Weight: 102.7 kg (226 lb 6.6 oz) IBW/kg (Calculated) : 59.3  Vital Signs: Temp: 98.8 F (37.1 C) (12/15 0815) Temp Source: Bladder (12/15 0800) BP: 119/45 (12/15 0815) Pulse Rate: 121 (12/15 0815)  Labs: Recent Labs    05/12/2022 0813 05/09/2022 1458 05/03/2022 1551 05/02/2022 2134 05/07/2022 2345 05/05/22 0435 05/05/22 0459 05/05/22 0900 05/05/22 1024 05/05/22 1026 05/05/22 2235 03-Anora-2024 0030 05/25/22 0214 05/25/2022 0412 05-25-2022 0638  HGB 13.6  --    < >  --    < > 11.6*   < >  --   --    < >  --  12.2 11.2* 10.9*  --   HCT 40.1  --    < >  --    < > 34.9*   < >  --   --    < >  --  36.0 36.2 32.0*  --   PLT 350  --   --   --   --  133*  --   --   --   --   --   --  105*  --   --   LABPROT 17.3*  --   --   --   --  21.5*  --   --   --   --   --   --   --   --   --   INR 1.4*  --   --   --   --  1.9*  --   --   --   --   --   --   --   --   --   HEPARINUNFRC  --   --   --   --   --   --   --   --  <0.10*  --  0.12*  --   --   --  <0.10*  CREATININE 1.13* 1.23*  --  1.69*  --  1.98*  --   --   --   --   --   --  2.77*  --   --   TROPONINIHS  --  43*  --   --   --   --   --  95* 90*  --   --   --   --   --   --    < > = values in this interval not displayed.     Estimated Creatinine Clearance: 24.2 mL/min (A) (by C-G formula based on SCr of 2.77 mg/dL (H)).   Medical History: Past Medical History:  Diagnosis Date   Alcoholic hepatitis    Anxiety    Chronic back pain    Chronic neck pain    Chronic pain    Diabetes mellitus    Fibromyalgia    Hemochromatosis    Hepatic encephalopathy (HCC)    Hypertension    Thrombocytopenia (HCC)    Thrombus DVT     Medications:  Scheduled:   artificial tears  1 Application Both Eyes [id number]   Chlorhexidine Gluconate Cloth  6 each Topical Q0600   docusate  100 mg Per Tube BID   fentaNYL (SUBLIMAZE) injection  25 mcg Intravenous Once  hydrocortisone sod succinate (SOLU-CORTEF) inj  100 mg Intravenous Q12H   insulin aspart  3-9 Units Subcutaneous Q4H   ipratropium-albuterol  3 mL Nebulization Q4H   nystatin   Topical TID   mouth rinse  15 mL Mouth Rinse Q2H   oseltamivir  30 mg Per Tube Daily   pantoprazole (PROTONIX) IV  40 mg Intravenous Q24H   polyethylene glycol  17 g Per Tube Daily   vancomycin variable dose per unstable renal function (pharmacist dosing)   Does not apply See admin instructions    Assessment: 67 y.o. female with ischemic right hand, possible arterial occlusion, for heparin  12/15 AM update:  Heparin level sub-therapeutic   Goal of Therapy:  Heparin level 0.3-0.7 units/ml Monitor platelets by anticoagulation protocol: Yes   Plan:  Increase heparin to 1600 units/hr Check heparin level in 8 hours  Jeanella Cara, PharmD, [facility] Clinical Pharmacist Please see AMION for all Pharmacists' Contact Phone Numbers 06-Fayth-2024, 8:48 AM

## 2022-05-23 NOTE — Progress Notes (Signed)
Progress Note:  I was called to the bedside to discuss goals of care and potential need for dialysis. I spoke with patient's husband at the bedside. We discussed how she has progressive renal failure in setting of septic shock due to bacteremia and ARDS. I let him know that the benefits of CRRT would be minimal in changing the outcome of her surviving this hospitalization. Based on that information he did not feel inclined to move forward with dialysis at this time per her wishes and discuss further with the day team. He does wish to continue current level of care at this time. He understands she is very sick. Patient to be left in supine position until primary day team can also weigh in on benefits of CRRT.   Melody Comas, MD [place] [facility] Office: [phone]

## 2022-05-23 NOTE — Progress Notes (Addendum)
eLink Physician-Brief Progress Note Patient Name: Katelyn Stevenson DOB: 01/28/1956 MRN: [id number]   Date of Service  05-16-22  HPI/Events of Note  Patient with worsening respiratory acidosis despite minute ventilation of 16, PH 7.05.  eICU Interventions  Bicarb 1 amp iv push and Bicarb gtt increased to 100 ml / hour.        Thomasene Lot Tyrese Capriotti 05-16-2022, 5:02 AM

## 2022-05-23 DEATH — deceased
